# Patient Record
Sex: Female | Born: 1945 | Race: White | Smoking: Current every day smoker
Health system: Northeastern US, Community
[De-identification: ages and names within clinical notes are randomized; demographics above are authoritative.]

## PROBLEM LIST (undated history)

## (undated) DIAGNOSIS — R1032 Left lower quadrant pain: Secondary | ICD-10-CM

## (undated) DIAGNOSIS — M549 Dorsalgia, unspecified: Secondary | ICD-10-CM

## (undated) DIAGNOSIS — H524 Presbyopia: Secondary | ICD-10-CM

## (undated) DIAGNOSIS — H0102B Squamous blepharitis left eye, upper and lower eyelids: Secondary | ICD-10-CM

## (undated) DIAGNOSIS — H0102A Squamous blepharitis right eye, upper and lower eyelids: Secondary | ICD-10-CM

## (undated) DIAGNOSIS — H04129 Dry eye syndrome of unspecified lacrimal gland: Secondary | ICD-10-CM

## (undated) DIAGNOSIS — H52203 Unspecified astigmatism, bilateral: Secondary | ICD-10-CM

## (undated) HISTORY — DX: Squamous blepharitis right eye, upper and lower eyelids: H01.02A

## (undated) HISTORY — DX: Presbyopia: H52.4

## (undated) HISTORY — DX: Unspecified astigmatism, bilateral: H52.203

## (undated) HISTORY — PX: BELPHAROPTOSIS REPAIR: SHX369

## (undated) HISTORY — DX: Left lower quadrant pain: R10.32

## (undated) HISTORY — PX: ABDOMINAL SURGERY: SHX537

## (undated) HISTORY — PX: TUBAL LIGATION: SHX77

## (undated) HISTORY — DX: Dry eye syndrome of unspecified lacrimal gland: H04.129

## (undated) HISTORY — DX: Squamous blepharitis left eye, upper and lower eyelids: H01.02B

## (undated) HISTORY — DX: Dorsalgia, unspecified: M54.9

---

## 2015-01-05 ENCOUNTER — Ambulatory Visit (HOSPITAL_BASED_OUTPATIENT_CLINIC_OR_DEPARTMENT_OTHER): Payer: Medicare (Managed Care) | Admitting: Family Medicine

## 2015-01-05 ENCOUNTER — Encounter (HOSPITAL_BASED_OUTPATIENT_CLINIC_OR_DEPARTMENT_OTHER): Payer: Self-pay | Admitting: Family Medicine

## 2015-01-05 VITALS — BP 132/80 | HR 89 | Temp 96.8°F | Ht 61.42 in | Wt 139.4 lb

## 2015-01-05 DIAGNOSIS — Z0000 Encounter for general adult medical examination without abnormal findings: Secondary | ICD-10-CM

## 2015-01-05 DIAGNOSIS — Z7689 Persons encountering health services in other specified circumstances: Secondary | ICD-10-CM

## 2015-01-05 DIAGNOSIS — F17200 Nicotine dependence, unspecified, uncomplicated: Secondary | ICD-10-CM

## 2015-01-05 DIAGNOSIS — M545 Low back pain: Secondary | ICD-10-CM

## 2015-01-05 MED ORDER — MELOXICAM 15 MG PO TABS
15.0000 mg | ORAL_TABLET | Freq: Every day | ORAL | Status: DC | PRN
Start: 2015-01-05 — End: 2015-07-27

## 2015-01-05 NOTE — Progress Notes (Signed)
Family Medicine Office Note    HPI:  Establish care  Did not connect with previous physician  Last mammogram 1 year ago  Last colonoscopy 2015  Pap smear was 4 years ago   30+ pack year hx - Had yearly CXR but not low dose CT per patient.     Family history:  Mother just passed away  Sister just dx with cancer- uterine, intestine, liver/kidney. Started chemo in [place]  Pt will be traveling there this weekend    Gyn concern:  Hx of growth between uterine and colon  Had monthly exams  Last time was 5 years ago   Had weight loss - per pt it was because she was taking excedrin 8-10 tabs  Regained weight with Ensure  Unsure what dx was but told it was OK    LLQ pain:  Occassional pain   Denies constipation or diarrhea or fever    Cramps in bilateral legs  Taking magnesium and other supplements  Intermittently bothersome    Low back pain;   Occasional radiation of pain down L leg  Controlled on meloxicam 1 tab PO daily PRN  Needs a refill     Social/PMH/FHx:     Past Medical History    Back pain          Past Surgical History    ABDOMINAL SURGERY      TUBAL LIGATION         Family History    Cancer - Other Sister     Comment: Uterus, ovarian, intestinal, liver       Social History   Marital Status: Single  Spouse Name: N/A    Years of Education: N/A  Number of Children: N/A     Occupational History  None on file     Social History Main Topics   Smoking status: Current Every Day Smoker  1.00 Packs/Day  For 53.00 Years     Smokeless tobacco: Not on file    Alcohol Use: Not on file    Drug Use: Not on file    Sexual Activity: Not on file   Not on file  Other Topics Concern   None on file     Social History Narrative       ROS: Pt denied CPE and SOB    EXAM:  BP 132/80 mmHg   Pulse 89   Temp(Src) 96.8 F (36 C) (Temporal)   Ht 5' 1.42" (1.56 m)   Wt 63.231 kg (139 lb 6.4 oz)   BMI 25.98 kg/m2   SpO2 100%  GEN: Well appearing, NAD  CARD: S1 and S2 normal, no murmurs, clicks, gallops or rubs. Regular rate and rhythm.  CHEST: Chest  is clear, no wheezing or rales. Normal symmetric air entry throughout both lung fields.  MOOD: Normal thought content, speech, affect, mood and dress are noted.      ASSESSMENT/PLAN:    Victoria Macdonald was seen today for establish care, other, other and other.    Diagnoses and all orders for this visit:    Right low back pain, with sciatica presence unspecified  Comments:   Chronic and controlled on meloxicam. pt requesting refill.  Orders:  -     meloxicam (MOBIC) 15 MG tablet; Take 1 tablet by mouth daily as needed for Pain.    Tobacco use disorder  Comments:   Previous quit attempts did not work. Declined assistance at this time.     Encounter to establish care with new doctor  Comments:   Medical records requested. Discussed previous gyn concern may have been fibroid or cyst. Will be able to explain more with records.     Healthcare maintenance  Comments:   Brief review of common screening recommendations. Pt will consider low dose CT when she returns from visiting sister.         **AVS given and patient instructions reviewed with patient. The pt verbalizes and demonstrated understanding. Advised to call/RTC with concerns.

## 2015-01-07 DIAGNOSIS — M545 Low back pain, unspecified: Secondary | ICD-10-CM | POA: Insufficient documentation

## 2015-01-07 DIAGNOSIS — F17200 Nicotine dependence, unspecified, uncomplicated: Secondary | ICD-10-CM | POA: Insufficient documentation

## 2015-03-01 ENCOUNTER — Ambulatory Visit (HOSPITAL_BASED_OUTPATIENT_CLINIC_OR_DEPARTMENT_OTHER): Payer: Medicare (Managed Care) | Admitting: Family Medicine

## 2015-03-04 ENCOUNTER — Encounter (HOSPITAL_BASED_OUTPATIENT_CLINIC_OR_DEPARTMENT_OTHER): Payer: Self-pay | Admitting: Family Medicine

## 2015-03-04 ENCOUNTER — Ambulatory Visit (HOSPITAL_BASED_OUTPATIENT_CLINIC_OR_DEPARTMENT_OTHER): Payer: Medicare (Managed Care) | Admitting: Family Medicine

## 2015-03-04 VITALS — BP 100/70 | HR 73 | Temp 98.2°F | Ht 60.63 in | Wt 138.0 lb

## 2015-03-04 DIAGNOSIS — Z8041 Family history of malignant neoplasm of ovary: Secondary | ICD-10-CM

## 2015-03-04 DIAGNOSIS — D269 Other benign neoplasm of uterus, unspecified: Secondary | ICD-10-CM

## 2015-03-04 DIAGNOSIS — Z0000 Encounter for general adult medical examination without abnormal findings: Secondary | ICD-10-CM

## 2015-03-04 DIAGNOSIS — R252 Cramp and spasm: Secondary | ICD-10-CM

## 2015-03-04 DIAGNOSIS — L989 Disorder of the skin and subcutaneous tissue, unspecified: Secondary | ICD-10-CM

## 2015-03-04 LAB — BASIC METABOLIC PANEL
ANION GAP: 7 mmol/L (ref 5–15)
BUN (UREA NITROGEN): 17 mg/dL (ref 7–18)
CALCIUM: 8.2 mg/dL — ABNORMAL LOW (ref 8.5–10.1)
CARBON DIOXIDE: 29 mmol/L (ref 21–32)
CHLORIDE: 106 mmol/L (ref 98–107)
CREATININE: 0.8 mg/dL (ref 0.4–1.2)
ESTIMATED GLOMERULAR FILT RATE: >60 mL/min (ref >60)
Glucose Random: 93 mg/dL (ref 74–160)
POTASSIUM: 4.2 mmol/L (ref 3.5–5.1)
SODIUM: 142 mmol/L (ref 136–145)

## 2015-03-04 LAB — CBC WITH PLATELET
HEMATOCRIT: 40.7 % (ref 34.1–44.9)
HEMOGLOBIN: 13.5 g/dL (ref 11.2–15.7)
MEAN CORP HGB CONC: 33.2 g/dL (ref 31.0–37.0)
MEAN CORPUSCULAR HGB: 28 pg (ref 26.0–34.0)
MEAN CORPUSCULAR VOL: 84.4 fL (ref 80.0–100.0)
MEAN PLATELET VOLUME: 11.7 fL (ref 8.7–12.5)
PLATELET COUNT: 144 10*3/uL — ABNORMAL LOW (ref 150–400)
RBC DISTRIBUTION WIDTH STD DEV: 40.3 fL (ref 35.1–46.3)
RBC DISTRIBUTION WIDTH: 13.2 % (ref 11.5–14.3)
RED BLOOD CELL COUNT: 4.82 M/uL (ref 3.90–5.20)
WHITE BLOOD CELL COUNT: 5.5 10*3/uL (ref 4.0–11.0)

## 2015-03-04 LAB — CHOLESTEROL: Cholesterol: 235 mg/dL (ref 0–239)

## 2015-03-04 LAB — LOW DENSITY LIPOPROTEIN DIRECT: LOW DENSITY LIPOPROTEIN DIRECT: 171 mg/dL (ref 0–189)

## 2015-03-04 LAB — HIGH DENSITY LIPOPROTEIN: HIGH DENSITY LIPOPROTEIN: 45 mg/dL (ref 40–?)

## 2015-03-04 LAB — PHOSPHORUS: PHOSPHORUS: 4.1 mg/dL (ref 2.5–4.9)

## 2015-03-04 LAB — MAGNESIUM: MAGNESIUM: 2.2 mg/dL (ref 1.8–2.4)

## 2015-03-04 NOTE — Progress Notes (Addendum)
Review of Systems   Constitutional: Negative for fever, weight loss and malaise/fatigue.   HENT: Negative for nosebleeds.    Eyes: Positive for blurred vision. Negative for double vision and pain.   Cardiovascular: Negative for chest pain and palpitations.   Gastrointestinal: Negative for vomiting.   Skin: Positive for rash.   Neurological: Negative for weakness.          Presents for complete physical and screening - very concerned because sister was recently diagnosed with ovarian cancer and wants all screens possible.    HPI  Back pain  -Taking the meloxicam for it, has been helping a lot, satisfied with it.    Leg cramping  -Has been having calf and thigh cramps. Calf pain has been in both calves, more left than right, sometimes thigh as well.  -No numbness, no tingling.  -Has been intermittent, can be severe, and can be at any time - sitting, sleeping, walking.  -Was told to take potassium and magnesium for it, which helped, but stopped taking last week because didn't want exams to be skewed.     Sleeping issues  -Having a lot of difficulty sleeping - difficulty falling asleep because mind has been very active   -Would take melatonin but didn't help, tried herbal teas but those didn't help  -Going to try benadryl  -Has not tried anything else  -Neighbor also vacuums early in the morning and wakes her up  -Lately has been getting 4-6 hours sleep/night    Low mood  -Mood has been more down than up  -Sometimes feels depressed  -Low interest in things  -Energy hasn't changed  -Doesn't feel that concentration very impaired  -Appetite hasn't changed - but says she's gained some weight because eating more of the "wrong foods"  -Sometimes feels guilty regarding her sister, but doesn't seem to feel that her guilt has significantly increased  -No thoughts about hurting herself  -Doesn't have anyone she can discuss these feelings with, but was not interested in counseling - feels that she likes to "deal with things  herself"    Smoking  -Decided not to quit smoking right now because it's a stressful time, but wants to quit eventually.    Other  -Slight abdominal pain in inguinal crease, more so on right side.  -Has a small patch of rough skin near right achilles tendon, improves with topical neosporin.  -Used to wear glasses with low prescription, felt her vision corrected and she didn't need them, but has noticed vision getting blurry recently so is planning on getting prescription checked.    Physical exam    Assessment  69 year old woman with a family history of ovarian cancer and a 53-year pack-year smoking history presents for preventative care maintenance. Patient is also in period of high stress and mood has been low, although     Plan  Patient to complete standard blood work and make appointments for bone density scan, mammogram, ovarian ultrasound, and one month follow-up. At follow-up appointment reattempt to perform smoking cessation counseling.

## 2015-03-05 ENCOUNTER — Encounter (HOSPITAL_BASED_OUTPATIENT_CLINIC_OR_DEPARTMENT_OTHER): Payer: Self-pay | Admitting: Family Medicine

## 2015-03-05 ENCOUNTER — Telehealth (HOSPITAL_BASED_OUTPATIENT_CLINIC_OR_DEPARTMENT_OTHER): Payer: Self-pay | Admitting: Family Medicine

## 2015-03-05 DIAGNOSIS — K635 Polyp of colon: Secondary | ICD-10-CM | POA: Insufficient documentation

## 2015-03-05 NOTE — Progress Notes (Signed)
Called pt re: test results & referral.    1. Low Ca - recommend repeat with albumin level, could also check PTH and vit D.  2. Borderline high cholesterol - recommend diet modification.  3. Low Plt - mildly decreased plt; recommend repeat.  4. Referral to Genetics - not available at [facility]; could refer to [facility].  She has too many appts at this time so prefers to delay this until after all of her tests are done.    She has appt on June 9 to review test results and would like to get bloodwork done at that visit.

## 2015-03-05 NOTE — Progress Notes (Signed)
Quick Note:    Patient was notified of results by telephone. See Tel Encounter.  ______

## 2015-03-08 ENCOUNTER — Ambulatory Visit (HOSPITAL_BASED_OUTPATIENT_CLINIC_OR_DEPARTMENT_OTHER): Payer: Medicare (Managed Care)

## 2015-03-08 NOTE — Progress Notes (Signed)
Victoria Macdonald is a 69 year old female here for: Physical    Patient Active Problem List:     Right low back pain     Tobacco use disorder     Sessile colonic polyp    HPI:  -here for CPEX, several concerns today    1. Family hx cancer  -sister recently diagnosed with ovarian cancer  -experiencing low mood, difficulty sleeping, guilt, increased appetite  -no thoughts of hurting herself  -very concerned about cancer screenings for herself - wants blood test for ovarian cancer    2. Leg cramps  -occur mainly at rest  -pain in calves, left worse than right  -no pain with exertion  -tried K and Mg supplements which helped somewhat; stopped prior to this visit    3. Pelvic mass  -old med records not available at this time  -pt reports h/o mass between uterus and rectum - has not had surveillance x 5 yrs  -wondering if she needs a Pap test    4. Smoking  -wants to quit but not ready yet due to recent stressors    MEDICATIONS:   Current Outpatient Prescriptions on File Prior to Visit:  loratadine (CLARITIN) 10 MG tablet Take 10 mg by mouth daily. Disp:  Rfl:    meloxicam (MOBIC) 15 MG tablet Take 1 tablet by mouth daily as needed for Pain. Disp: 90 tablet Rfl: 2     No current facility-administered medications on file prior to visit.   PAST MEDICAL, SURGICAL, FAMILY, SOCIAL, IMMUNIZATION, HEALTH MAINT HISTORY: The following sections have been reviewed and updated in Epic:  past medical history, past surgical history, family history, social history    ROS:  -patch of skin on back of right calf - uses neosporin which helps, scratched it recently and thinks it became red after this    PE:  BP 100/70  Pulse 73  Temp 98.2 F (36.8 C) (Temperature probe)  Ht 5' 0.63" (1.54 m)  Wt 62.6 kg (138 lb)  LMP  (LMP Unknown)  SpO2 98%  BMI 26.39 kg/m2  General: well appearing  HEENT: Normocephalic/atraumatic. EOMI. No scleral icterus or injection. Ears: EAC clear, TM shiny/gray/clear.  Nares: clear.  OP: clear.  Mucous membranes are  moist.   Neck: supple, no lymphadenopathy, no thyromegaly, no thyroid nodules.  Lungs: clear to auscultation bilat, no crackles/wheezes/rhonchi  CV: regular rate and rhythm, S1 & S2 normal, no murmurs/rubs/gallops  ABD: soft, non-tender, no HSM.  Extrem: warm, well perfused, no edema. 1+ DP pulses bilat.  Skin: on back of right calf, just above achilles tendon is a 1cm skin lesion with central telangiectasia, no ulceration, no rolled borders, no scale.    Assessment & Plan:  1. Health maintenance examination  -overall well but very worried about family hx ovarian cancer  - XR Bone Densitometry, Lumbar/Hips; Future  - [facility] Screening Bilateral W CAD; Future    2. Cramp of both lower extremities  -check labs, electrolytes today   - Basic Metabolic Panel   - Magnesium   - Phosphorus   - Cholesterol   - Low Density Lipoprotein, Direct   - High Density Lipoprotein   - CBC with Platelet    3. Benign tumor of uterus  -unclear etiology, still awaiting outside records  -no surveillance x 5 yrs  -ordered US Pelvis Non-Pregnant; Future    4. Family history of ovarian cancer  -discussed various screening methods - advised her that blood testing and  [place] are of limited utility for ovarian cancer screening  -recommended eval by Cancer Genetics to determine most appropriate testing - referral made (Addendum: [facility] no longer available.  Offered [facility] genetics but she declines at this time, prefers to do other testing first)    5. Skin lesion of right leg  -raised skin lesion with telangiectasia - recommend monitoring; recent picking/scratching could contribute to abnormal appearance; recommend avoid touching and reassess @ next visit

## 2015-03-19 ENCOUNTER — Ambulatory Visit (HOSPITAL_BASED_OUTPATIENT_CLINIC_OR_DEPARTMENT_OTHER): Payer: Medicare (Managed Care)

## 2015-03-19 DIAGNOSIS — Z1211 Encounter for screening for malignant neoplasm of colon: Secondary | ICD-10-CM

## 2015-03-19 LAB — POC IMMUNOASSAY FECAL OCCULT BLOOD TEST: POC FECAL OCCULT BLOOD TEST (IMMUNOASSAY): NEGATIVE

## 2015-03-19 NOTE — Progress Notes (Signed)
.  ifob drop off Victoria Macdonald, 03/19/2015, 12:44 PM

## 2015-03-19 NOTE — Progress Notes (Signed)
Dear [place], Please:     1. Select the language ([place]) appropriate Letter Template.       2. Insert the following comments:           Normal     Signed, Fredirick Lathe, MD    "     3. Send the letter to the patient.    Thank you,  Fredirick Lathe, MD

## 2015-03-24 ENCOUNTER — Ambulatory Visit: Payer: Self-pay | Admitting: Family Medicine

## 2015-03-24 DIAGNOSIS — D269 Other benign neoplasm of uterus, unspecified: Secondary | ICD-10-CM

## 2015-03-24 DIAGNOSIS — Z0000 Encounter for general adult medical examination without abnormal findings: Secondary | ICD-10-CM

## 2015-03-24 DIAGNOSIS — Z8041 Family history of malignant neoplasm of ovary: Secondary | ICD-10-CM

## 2015-03-25 LAB — US PELVIC NON-OB W TRANSVAG, 3D

## 2015-03-26 NOTE — Progress Notes (Signed)
PCP will review at her next appt.

## 2015-03-27 LAB — XR DXA BONE DENSITOMETRY

## 2015-04-01 ENCOUNTER — Ambulatory Visit (HOSPITAL_BASED_OUTPATIENT_CLINIC_OR_DEPARTMENT_OTHER): Payer: Medicare (Managed Care) | Admitting: Family Medicine

## 2015-04-01 ENCOUNTER — Encounter (HOSPITAL_BASED_OUTPATIENT_CLINIC_OR_DEPARTMENT_OTHER): Payer: Self-pay | Admitting: Family Medicine

## 2015-04-01 VITALS — BP 124/70 | HR 84 | Temp 97.5°F | Wt 138.2 lb

## 2015-04-01 DIAGNOSIS — M810 Age-related osteoporosis without current pathological fracture: Secondary | ICD-10-CM

## 2015-04-01 DIAGNOSIS — M545 Low back pain: Secondary | ICD-10-CM

## 2015-04-01 DIAGNOSIS — Z7189 Other specified counseling: Secondary | ICD-10-CM

## 2015-04-01 DIAGNOSIS — G479 Sleep disorder, unspecified: Secondary | ICD-10-CM

## 2015-04-01 DIAGNOSIS — R7989 Other specified abnormal findings of blood chemistry: Secondary | ICD-10-CM

## 2015-04-01 DIAGNOSIS — J310 Chronic rhinitis: Secondary | ICD-10-CM

## 2015-04-01 DIAGNOSIS — D696 Thrombocytopenia, unspecified: Secondary | ICD-10-CM

## 2015-04-01 LAB — COMPREHENSIVE METABOLIC PANEL
ALANINE AMINOTRANSFERASE: 20 U/L (ref 12–45)
ALBUMIN: 4.1 g/dL (ref 3.4–5.0)
ALKALINE PHOSPHATASE: 67 U/L (ref 45–117)
ANION GAP: 9 mmol/L (ref 5–15)
ASPARTATE AMINOTRANSFERASE: 17 U/L (ref 8–34)
BILIRUBIN TOTAL: 0.7 mg/dL (ref 0.2–1.0)
BUN (UREA NITROGEN): 13 mg/dL (ref 7–18)
CALCIUM: 8.7 mg/dL (ref 8.5–10.1)
CARBON DIOXIDE: 26 mmol/L (ref 21–32)
CHLORIDE: 107 mmol/L (ref 98–107)
CREATININE: 0.8 mg/dL (ref 0.4–1.2)
ESTIMATED GLOMERULAR FILT RATE: >60 mL/min (ref >60)
Glucose Random: 91 mg/dL (ref 74–160)
POTASSIUM: 4.4 mmol/L (ref 3.5–5.1)
SODIUM: 142 mmol/L (ref 136–145)
TOTAL PROTEIN: 7.3 g/dL (ref 6.4–8.2)

## 2015-04-01 LAB — CBC WITH PLATELET
HEMATOCRIT: 42 % (ref 34.1–44.9)
HEMOGLOBIN: 14 g/dL (ref 11.2–15.7)
MEAN CORP HGB CONC: 33.3 g/dL (ref 31.0–37.0)
MEAN CORPUSCULAR HGB: 29.2 pg (ref 26.0–34.0)
MEAN CORPUSCULAR VOL: 87.7 fL (ref 80.0–100.0)
MEAN PLATELET VOLUME: 12.6 fL — ABNORMAL HIGH (ref 8.7–12.5)
PLATELET COUNT: 157 10*3/uL (ref 150–400)
RBC DISTRIBUTION WIDTH STD DEV: 42.4 fL (ref 35.1–46.3)
RBC DISTRIBUTION WIDTH: 13.2 % (ref 11.5–14.3)
RED BLOOD CELL COUNT: 4.79 M/uL (ref 3.90–5.20)
WHITE BLOOD CELL COUNT: 5.7 10*3/uL (ref 4.0–11.0)

## 2015-04-01 LAB — VITAMIN D,25 HYDROXY: VITAMIN D,25 HYDROXY: 20 ng/mL — ABNORMAL LOW (ref 30.0–100.0)

## 2015-04-01 MED ORDER — TRAZODONE HCL 50 MG PO TABS
25.0000 mg | ORAL_TABLET | Freq: Every evening | ORAL | 1 refills | Status: DC
Start: 2015-04-01 — End: 2016-03-08

## 2015-04-01 MED ORDER — ALENDRONATE SODIUM 70 MG PO TABS
ORAL_TABLET | ORAL | 11 refills | Status: DC
Start: 2015-04-01 — End: 2016-03-08

## 2015-04-01 MED ORDER — FLUTICASONE PROPIONATE 50 MCG/ACT NA SUSP
1.0000 | Freq: Every day | NASAL | 3 refills | Status: DC
Start: 2015-04-01 — End: 2016-05-26

## 2015-04-01 NOTE — Progress Notes (Signed)
Family Medicine Office Note    HPI:    Thrombocytopenia:  Not known before for patient  Interested in repeating test    Cholesterol:  TC 235  LDL 171  HDL 45  Not currently on medication  Increasing salad and oatmeal    Pain if LLQ:  Unable to be visualized on U/S  Denies constipation or diarrhea  Eating oatmeal and aloe  Bimanual per GYN OK per patient     Osteoporosis:  Low Calcium on lab  Per bone density  Pt said she has tried calcium and vitamin D   Does try to eat milk, cheese  Being outside for 89mn a day    Facial pressure with PND:  Has tried loratidine  Cetirizine and allegra did not work   BData processing managera front headache  Makes it hard to sleep  Last tried flonase years ago     Sleep disturbance"  Worse with recent family stressors  Read a lot   Doing puzzles and Facebook for distraction  Nothing for exercise     HM:  Pap- pt requested. Had it done in [place]. Normal per letter to patient    Social/PMH/FHx:   Sister with ovarian cancer- becoming sicker  Tobacco use    ROS:   Endorses low back pain  R knee pain     EXAM:  BP 124/70  Pulse 84  Temp 97.5 F (36.4 C)  Wt 62.7 kg (138 lb 3.2 oz)  LMP  (LMP Unknown)  SpO2 99%  BMI 26.43 kg/m2  GEN: Well appearing, NAD  MOOD: Normal thought content, speech, affect, mood and dress are noted.    ASSESSMENT/PLAN:    Victoria Normingtonis a [age]year old female who presents for    1. Sleep disturbance  Likely due to recent stressors. Pt interested in non-addictive medications  TRied melatonin in past without improvement  - Reviewed increase exercise in AM and other sleep hygiene   - traZODone (DESYREL) 50 MG tablet; Take 0.5-1 tablets by mouth nightly  Dispense: 30 tablet; Refill: 1    We discussed Trazodone and the importance of medication compliance. The patient was ready to learn and no apparent learning barriers were identified. I explained the diagnosis and treatment plan, and the patient expressed understanding of the content. Possible side effects  of the prescribed medication(s) were explained, including sedation/groggy, headache, mood changes.  I attempted to answer any questions regarding the diagnosis and the proposed treatment.      2. Osteoporosis  Per bone density  Pt has not tolerated calcium or vitamin D supplements in the past  - Discussed foods with theses vitamins  - alendronate (FOSAMAX) 70 MG tablet; with full glass of water on empty stomach-nothing else by mouth and do not lie down for 1/2 hour  Dispense: 4 tablet; Refill: 11   We discussed alendronate and the importance of medication compliance. The patient was ready to learn and no apparent learning barriers were identified. I explained the diagnosis and treatment plan, and the patient expressed understanding of the content. Possible side effects of the prescribed medication(s) were explained, including abdominal discomfort.  I attempted to answer any questions regarding the diagnosis and the proposed treatment.    3. Chronic rhinitis  Despite loratadine daily. Will add fluticasone daily  - fluticasone (FLONASE) 50 MCG/ACT nasal spray; 1 spray by Each Nostril route daily  Dispense: 16 g; Refill: 3  - Discussed potential risk of nose bleeds, skin thinning and glaucoma  4. Thrombocytopenia  Will recheck lab  - CBC with Platelet    5. Low serum calcium  Will recheck lab  - Comprehensive Metabolic Panel  - [id number] (Screening)  - ROUTINE VENIPUNCTURE    6. Right low back pain, with sciatica presence unspecified  Deferred due to other concerns    7. Cardiac risk counseling  Lipids overall OK  Cardiac risk high due to smoking  Pt will continue healthy diet  Consider adding statin in future    '[]'$  Next visit- sciatica, knee pain, tobacco use    **AVS given and patient instructions reviewed with patient, who had the opportunity to ask questions. The pt verbalizes and demonstrated understanding. Advised to call/RTC with concerns.

## 2015-04-01 NOTE — Patient Instructions (Signed)
Index Spanish version   Alendronate Sodium, Oral   a-LEN-droh-nate SOH-dee-um  What are other names for this medicine?   Type of medicine: bisphosphonate (reduces bone loss)  Generic and brand names: alendronate sodium, oral; Fosamax  What is this medicine used for?  This medicine is taken by mouth to treat Paget's disease of bone, a disease that destroys or deforms bones.  This medicine is used to prevent or treat osteoporosis (bone loss) in postmenopausal women (those who have stopped having monthly periods) or to treat bone loss in men.  It is also used to treat bone loss in people receiving corticosteroid medicines (for example, prednisone).   What should my healthcare provider know before I take this medicine?   Before taking this medicine, tell your healthcare provider if you have ever had:   an allergic reaction to any medicine    cancer such as leukemia or lymphoma    hypocalcemia (low calcium levels in your blood)    kidney disease    problems or pain when swallowing    ulcers or diseases of the stomach or esophagus.   Tell your healthcare provider if you are not able to sit or stand upright for at least 30 minutes after taking this medicine.   Tell your provider if you have any dental problems, or if you are scheduled for dental surgery. Also tell your healthcare provider the date of your last dental exam. You may be at risk of a disease called osteonecrosis of the jaw if you have any dental problems or dental surgery while taking this medicine.  Females of childbearing age: Tell your healthcare provider if you are pregnant or plan to become pregnant while taking this medicine and after you stop taking this medicine. It is not known whether this medicine will harm an unborn baby. Do not breast-feed while taking this medicine without your healthcare provider's approval.  How do I take it?   Read the Medication Guide that comes in the medicine package when you start taking this medicine and each  time you get a refill.   Check the label on the medicine for directions about your specific dose. Take this medicine exactly as your healthcare provider prescribes. Do not take more or less or take it longer than prescribed.  Take the medicine in the morning, before the first food, beverage, or medicines of the day. Do not take this medicine with, or soon after you take other medicines eat, or drink beverages other than water. It will make this medicine less effective.  Take it with a full glass (8 ounces) of plain water (not mineral water, coffee, tea, or orange juice) while you are standing or sitting upright. If you have the tablet, swallow it whole. Do not break, chew, or let the tablet dissolve in your mouth.  This medicine is available in the form of tablets or as a liquid. If you have the liquid form, drink it all at once. Do not sip and do not mix with any other liquid. Drink at least a quarter of a cup of water (2 ounces) after you take the liquid form of this medicine.  Do not take this medicine at bedtime. Do NOT lie down for at least 30 minutes after taking this medicine. This will keep it from irritating your mouth and throat.  Wait at least 30 minutes after taking this medicine before eating or drinking. ALL foods and drinks (except plain water) can make this medicine less effective. Talk with  your healthcare provider about this.  Take calcium and vitamin D supplements, antacids, or any other medicine at least 30 minutes after this medicine.   Follow your healthcare provider's instructions about exercise, diet, and taking vitamin supplements such as calcium and vitamin D.   What if I miss a dose?  If you miss a once-daily morning dose, do not take it later in the day. Skip the missed dose and take the next scheduled dose as directed. If you miss a once-weekly dose, take 1 tablet the next day. Do not take double doses. If you are not sure of what to do if you miss a dose, or if you miss more than one  dose, contact your healthcare provider.  What if I overdose?  If you or anyone else has intentionally taken too much of this medicine, call 911 or go to the emergency room right away. If you pass out, have seizures, weakness or confusion, or have trouble breathing, call 911. If you think that you or anyone else may have taken too much of this medicine, call the poison control center. Do this even if there are no signs of discomfort or poisoning. The poison control center number is [phone].  Symptoms of an acute overdose may include: nausea, vomiting, heartburn, stomach pain, diarrhea, muscle cramps or stiffness, numbness, tingling, seizures, irritability, unusual thoughts or behavior, coughing blood, black tarry stools.  What should I watch out for?   This medicine may increase your risk for a rare, but severe thigh bone fracture. Contact your healthcare provider right away if you have any new or unusual thigh or hip pain. Talk with your healthcare provider about this.  You may need a bone density test before you start taking this medicine. Your healthcare provider may want to test you again after 6 to 12 months to see how this medicine affects you.   You may need blood tests regularly while you are taking this medicine. Keep all appointments for these tests.   This medicine may increase severe muscle, joint, or bone pain or may cause pain to occur more often. The increased pain may be temporary and may stop during treatment. The pain may occur when you first start taking this medicine or several months later. If the pain continues or gets worse, contact your healthcare provider.  This medicine may cause a rare, but serious disease called osteonecrosis of the jaw. You may be at an increased risk for this problem if you have any dental problems or dental surgery, have cancer, anemia, or blood clotting problems, or you are receiving chemotherapy or corticosteroids (such as prednisone). Talk with your healthcare  provider about this. You should brush your teeth and clean your mouth properly while taking this medicine. If you have any jaw pain, swelling, numbness, or loose teeth, contact your healthcare provider right away.  If you need emergency care, surgery, or dental work, tell the healthcare provider or dentist you are taking this medicine.  What are the possible side effects?   Along with its needed effects, your medicine may cause some unwanted side effects. Some side effects may be very serious. Some side effects may go away as your body adjusts to the medicine. Tell your healthcare provider if you have any side effects that continue or get worse.  Life-threatening (Report these to your healthcare provider right away. If you cannot reach your healthcare provider right away, get emergency medical care or call 911 for help): Allergic reaction (hives; itching; rash; trouble  breathing; tightness in your chest; swelling of your lips, tongue, and throat).  Serious (report these to your healthcare provider right away): Difficulty or pain on swallowing, new or worsening heartburn, severe nausea or vomiting; chest pain or pain under your ribs or in your back; new or unusual pain in your thigh or hip; black or bloody bowel movements; jaw pain, swelling or numbness; severe bone, joint, or muscle pain; skin redness, blistering, or peeling; diarrhea that continues or gets worse; eye pain or redness.  Other: Mild muscle pain, weakness, mild nausea, constipation, diarrhea, gas, headache, upset stomach; runny or stuffy nose, fever, chills.  What products might interact with this medicine?   When you take this medicine with other medicines, it can change the way this or any of the other medicines work. Nonprescription medicines, vitamins, and natural remedies may also interact. Using these products together might cause harmful side effects. Talk to your healthcare provider if you are taking:   antacids that contain aluminum or  magnesium such as Maalox or Mylanta or magnesium supplements (follow instructions about how long to wait after taking this medicine)    aspirin or other salicylates    calcium supplements and vitamin D (follow instructions about how long to wait after taking this medicine)    iron supplements and iron-containing products (follow instructions about how long to wait after taking this medicine)    nonsteroidal anti-inflammatory drugs (NSAIDs) such as diclofenac (Voltaren, Cataflam), ibuprofen (Motrin, Motrin IB, Advil), indomethacin (Indocin), ketoprofen, ketorolac (Toradol), nabumetone (Relafen), naproxen (Naprosyn, Anaprox, Aleve, Naprelan), oxaprozin (Daypro), piroxicam (Feldene), and sulindac (Clinoril)    phosphate supplements   If you take any other medicines, make sure that you know how long to wait before taking them after you have taken this medicine.  If you are not sure if your medicines might interact, ask your pharmacist or healthcare provider. Keep a list of all your medicines with you. List all the prescription medicines, nonprescription medicines, supplements, natural remedies, and vitamins that you take. Be sure that you tell all healthcare providers who treat you about all the products you are taking.   How should I store this medicine?   Store this medicine at room temperature. Keep the container tightly closed. Protect it from heat, high humidity, and bright light.    This advisory includes selected information only and may not include all side effects of this medicine or interactions with other medicines. Ask your healthcare provider or pharmacist for more information or if you have any questions.  Ask your pharmacist for the best way to dispose of outdated medicine or medicine you have not used. Do not throw medicine in the trash.  Keep all medicines out of the reach of children.   Do not share medicines with other people.   Developed by [employer].  Medication Advisor 2012.1 published by  [employer].  Last modified: 2009-12-10  Last reviewed: 2009-08-26  This content is reviewed periodically and is subject to change as new health information becomes available. The information is intended to inform and educate and is not a replacement for medical evaluation, advice, diagnosis or treatment by a healthcare professional.  References   Medication Advisor 2012.1 Index    2012 [employer] and/or its affiliates. All rights reserved.      Index Spanish version   Trazodone, Oral   TRAZ-oh-done  What are other names for this medicine?   Type of medicine: antidepressant  Generic and brand names: trazodone, oral; Oleptro  What is this medicine  used for?  This medicine is taken by mouth to treat depression.  It may be used for other conditions as determined by your healthcare provider.  What should my healthcare provider know before I take this medicine?   Before taking this medicine, tell your healthcare provider if you have ever had:   an allergic reaction to any medicine    heart disease or a heart attack    irregular heartbeat    long QT syndrome (problems with electrical activity in the heart muscle)    liver or kidney disease    mental illness such as bipolar disorder, paranoia, or schizophrenia    problems with too little potassium, magnesium, or sodium in your blood    seizures    thoughts of suicide   Females of childbearing age: Tell your healthcare provider if you are pregnant or plan to become pregnant. It is not known whether this medicine will harm an unborn baby. Do not breast-feed while taking this medicine without your healthcare provider's approval.  How do I take it?   Read the Medication Guide that comes in the medicine package when you start taking this medicine and each time you get a refill.  Check the label on the medicine for directions about your specific dose. Take this medicine exactly as your healthcare provider prescribes. Do not take more of it or take it longer than  prescribed. Do not stop taking this medicine without your healthcare provider's approval. You may have to reduce your dosage gradually. Stopping too quickly may cause withdrawal symptoms.  This medicine may come in different forms. If you have extended-release tablets, do not crush or chew them. Swallow them whole. However, you may break the extended-release tablet in half, but do not crush, or chew them. Swallow them with a full glass of water. Take the extended-release tablets at the same time every day in the late evening, preferably at bedtime, on an empty stomach. Ask your pharmacist if you have the extended-release tablets.   Take the regular tablets (non extended-release) with food.  Take this medicine with food.  What if I miss a dose?  If you miss a dose, take it as soon as you remember unless it is almost time for the next scheduled dose. In that case, skip the missed dose and take the next one as directed. Do not take double doses. If you are not sure of what to do if you miss a dose, or if you miss more than one dose, contact your healthcare provider. Do not stop taking this medicine without your healthcare provider's approval. You may need to reduce your dose slowly to avoid withdrawal symptoms.  What if I overdose?  If you or anyone else has intentionally taken too much of this medicine, call 911 or go to the emergency room right away. If you pass out, have seizures, weakness or confusion, or have trouble breathing, call 911. If you think that you or anyone else may have taken too much of this medicine, call the poison control center. Do this even if there are no signs of discomfort or poisoning. The poison control center number is [phone].  Symptoms of an acute overdose may include: drowsiness, erection that lasts more than 4 hours, vomiting, irregular heartbeat, seizures, slow breathing, breathing that stops.  What should I watch out for?   Antidepressant medicines may increase suicidal thoughts  or actions in some children, teenagers, and young adults within the first few months of treatment. Talk with  your provider about this.  Behavior changes may be caused by the medicine or by depression or another mental illness. Contact your provider right away if you or your family notice any disturbing changes in your thoughts or behavior, such as:   more outgoing or aggressive behavior than normal    confusion    hallucinations    worsening of depression    suicidal thoughts.   It may take several weeks before you start to feel better. Do not stop taking this medicine unless your healthcare provider tells you to do so. You may have withdrawal symptoms if you stop this medicine abruptly.  This medicine increases the effects of alcohol and other drugs that slow down your nervous system. Do not drink alcohol or take other medicines unless your healthcare provider approves.  This medicine may make you dizzy or drowsy or cause blurred vision. Do not drive or operate machinery unless you are fully alert.  You may feel dizzy or faint when you get up quickly after sitting or lying down. Getting up slowly may help.  If you need emergency care, surgery, or dental work, tell the healthcare provider or dentist you are taking this medicine.  Adults over the age of 65 may be at greater risk for side effects. Talk with your healthcare provider about this.  Men: Rarely, this medicine may cause a painful erection of the penis that will not return to normal. If you have an erection lasting more than 4 hours, contact your healthcare provider or get medical care right away. It can lead to permanent erectile dysfunction if not treated.  What are the possible side effects?   Along with its needed effects, your medicine may cause some unwanted side effects. Some side effects may be very serious. Some side effects may go away as your body adjusts to the medicine. Tell your healthcare provider if you have any side effects that  continue or get worse.  Life-threatening (Report these to your healthcare provider right away. If you are unable to reach your healthcare provider right away, get emergency medical care or call 911 for help): Allergic reaction (hives; itching; rash; trouble breathing; tightness in your chest; swelling of your lips, tongue, and throat).  Serious (report these to your healthcare provider right away): Chest pain; chills; fast or irregular heartbeat; hallucinations; high fever; rash; heavy sweating; fainting; loss of bladder control; numbness or tingling in the hands or feet; prolonged erection; seizures; severe confusion; trouble concentrating or memory problems; trouble walking or loss of balance; persistent headache; severe drowsiness; severe muscle stiffness; thoughts of suicide; trouble urinating; twitching or involuntary movement of your body or face; unusual excitement; unusual bruising or bleeding; unusual tiredness or weakness; new or sudden changes in mood, behaviors, thoughts, or feelings; new or worsening trouble sleeping; severe restlessness or panic attacks.  Other: Abnormal dreams, vision problems, constipation, diarrhea, mild dizziness, mild drowsiness, dry mouth, increased or decreased interest in sex, nausea, mild headache, change in sense of taste, muscle aches or pain.  What products might interact with this medicine?   When you take this medicine with other medicines, it can change the way this or any of the other medicines work. Nonprescription medicines, vitamins, natural remedies, and certain foods may also interact. Using these products together might cause harmful side effects. Talk to your healthcare provider if you are taking:   antiarrhythmics (medicine to treat irregular heartbeat) such as amiodarone (Cordarone, Pacerone), disopyramide (Norpace), flecainide (Tambocor), procainamide, propafenone (Rythmol), and quinidine  antibiotics such as clarithromycin (Biaxin), erythromycin  (Erythrocin, EES, E-Mycin, EryPed), ciprofloxacin (Cipro), isoniazid (Laniazid), levofloxacin (Levaquin), linezolid (Zyvox), norfloxacin (Noroxin), ofloxacin (Floxin), metronidazole (Flagyl), pentamidine (NebuPent), rifampin (Rifampin, Rimactane), and rifabutin (Mycobutin)    antidepressants such as amitriptyline, amoxapine, bupropion (Wellbutrin, Zyban), citalopram (Celexa), clomipramine (Anafranil), desipramine (Norpramin), desvenlafaxine (Pristiq), doxepin, duloxetine (Cymbalta), escitalopram (Lexapro), fluoxetine (Prozac), fluvoxamine (Luvox CR), imipramine (Tofranil), paroxetine (Paxil), nefazodone, nortriptyline (Pamelor), protriptyline (Vivactil), sertraline (Zoloft), trimipramine (Surmontil), and venlafaxine (Effexor)    antiseizure medicines such as carbamazepine (Tegretol), felbamate (Felbatol), gabapentin (Neurontin), lamotrigine (Lamictal), levetiracetam (Keppra), oxcarbazepine (Trileptal), phenytoin (Dilantin), primidone (Mysoline), tiagabine (Gabitril), topiramate (Topamax), and valproic acid (Depakote, Depakene)    antifungals such as fluconazole (Diflucan), itraconazole (Sporanox), and ketoconazole (Nizoral)    antihistamines such as chlorpheniramine (Chlor-Trimeton), clemastine (Tavist), diphenhydramine (Benadryl), and hydroxyzine (Vistaril)    antinausea medicines such as prochlorperazine and promethazine (Phenergan)    antipsychotic medicines such as chlorpromazine, clozapine (Clozaril), fluphenazine, haloperidol (Haldol), loxapine (Loxitane), olanzapine (Zyprexa), perphenazine, pimozide (Orap), risperidone (Risperdal), thioridazine, trifluoperazine, and ziprasidone (Geodon)    anti-HIV medicines such as indinavir (Crixivan), nelfinavir (Viracept), ritonavir (Norvir, Kaletra), and saquinavir (Invirase)    aspirin and other salicylates    barbiturates such as butabarbital (Butisol), pentobarbital (Nembutal), and phenobarbital    benzodiazepines such as alprazolam (Xanax), diazepam  (Valium), lorazepam (Ativan), and triazolam (Halcion)    beta blockers such as acebutolol (Sectral), atenolol (Tenormin), carvedilol (Coreg), labetalol (Trandate), metoprolol (Lopressor, Toprol XL), nadolol (Corgard), pindolol, propranolol (Inderal LA), and sotalol (Betapace)    buspirone (BuSpar)    calcium channel blockers such as diltiazem (Cardizem, Tiazac), felodipine, isradipine (DynaCirc CR), nicardipine (Cardene), nifedipine (Adalat CC, Procardia), and verapamil (Calan, Covera-HS, Isoptin SR, Verelan)    cimetidine (Tagamet)    dextromethorphan, an ingredient in many cough and cold medicines such as Robitussin-DM    cyclosporine (Sandimmune, Neoral, Gengraf)    digoxin (Lanoxin)    diuretics (water pills) such as chlorothiazide (Diuril), hydrochlorothiazide (Microzide), and triamterene (Dyrenium)    natural remedies such as gotu kola, kava, St. John's wort, SAMe, and valerian    linezolid (Zyvox)    lithium (Lithobid)    MAO inhibitor antidepressants such as isocarboxazid (Marplan), phenelzine (Nardil), selegiline (Eldepryl, Emsam), and tranylcypromine (Parnate) (Do not take this medicine and an MAO inhibitor within 14 days of each other.)    medicines to treat migraines such as almotriptan (Axert), dihydroergotamine (D.[address]), eletriptan (Relpax), ergotamine (Cafergot, Ergomar), frovatriptan (Frova), naratriptan (Amerge), rizatriptan (Maxalt), sumatriptan succinate (Imitrex), and zolmitriptan (Zomig)    muscle relaxants such as baclofen (Lioresal), carisoprodol (Soma), cyclobenzaprine (Flexeril), dantrolene (Dantrium), methocarbamol (Robaxin), and tizanidine (Zanaflex)    narcotic pain medicines such as codeine, fentanyl (Duragesic, Actiq), hydrocodone/acetaminophen (Vicodin), hydromorphone (Dilaudid), meperidine (Demerol), morphine (Kadian, MS Contin), methadone (Dolophine), oxycodone (OxyContin, Roxicodone), oxycodone/acetaminophen (Percocet, Tylox), and pentazocine (Talwin)     nonsteroidal anti-inflammatory medicines (NSAIDs) such as diclofenac (Voltaren, Cataflam), ibuprofen (Motrin, Motrin IB, Advil), indomethacin (Indocin), ketoprofen, ketorolac (Toradol), nabumetone (Relafen), naproxen (Naprosyn, Anaprox, Aleve, Naprelan), oxaprozin (Daypro), piroxicam (Feldene), and sulindac (Clinoril)    procarbazine (Matulane)    products that contain methylene blue (Prosed DS, Utira-C, Urelle, Utrona-C)    sedatives such as eszopiclone (Lunesta), zaleplon (Sonata), and zolpidem (Ambien)    tramadol (Ultram, Ultram ER)    warfarin (Coumadin)   Do not drink alcohol while you are taking this medicine.  The effects of this medicine may be increased if you take it with grapefruit juice. Talk with your healthcare provider about this.  If you are not sure  if your medicines might interact, ask your pharmacist or healthcare provider. Keep a list of all your medicines with you. List all the prescription medicines, nonprescription medicines, supplements, natural remedies, and vitamins that you take. Be sure that you tell all healthcare providers who treat you about all the products you are taking.   How should I store this medicine?  Store this medicine at room temperature. Keep the container tightly closed. Protect it from heat, high humidity, and bright light.    This advisory includes selected information only and may not include all side effects of this medicine or interactions with other medicines. Ask your healthcare provider or pharmacist for more information or if you have any questions.  Ask your pharmacist for the best way to dispose of outdated medicine or medicine you have not used. Do not throw medicine in the trash.  Keep all medicines out of the reach of children.   Do not share medicines with other people.   Developed by [employer].  Medication Advisor 2012.1 published by [employer].  Last modified: 2010-08-22  Last reviewed: 2008-12-29  This content is reviewed periodically and is  subject to change as new health information becomes available. The information is intended to inform and educate and is not a replacement for medical evaluation, advice, diagnosis or treatment by a healthcare professional.  References   Medication Advisor 2012.1 Index    2012 [employer] and/or its affiliates. All rights reserved.

## 2015-04-09 ENCOUNTER — Encounter (HOSPITAL_BASED_OUTPATIENT_CLINIC_OR_DEPARTMENT_OTHER): Payer: Self-pay | Admitting: Family Medicine

## 2015-04-09 NOTE — Progress Notes (Signed)
Pt informed of results by letter

## 2015-04-20 ENCOUNTER — Telehealth (HOSPITAL_BASED_OUTPATIENT_CLINIC_OR_DEPARTMENT_OTHER): Payer: Self-pay | Admitting: Registered Nurse

## 2015-04-20 NOTE — Telephone Encounter (Signed)
-----   Message from [place] sent at 04/20/2015  4:17 PM EDT -----  Regarding: prescription request   Victoria Macdonald [id number], 69 year old, female    Calls today:   Patient is under a lot of stress is wondering if prescription for  Lorazepam  1 mg  Can be sent to the pharmacy. Please advise     [id number] [place], [place] [address]   Person calling on behalf of patient: Patient (self)    CALL BACK NUMBER: [phone] Best time to call back: any  Cell phone:   Other phone:    Patient's language of care: English    Patient does not need an interpreter.    Patient's PCP: CFredirick Lathe MD

## 2015-04-21 NOTE — Progress Notes (Signed)
TC to this pt ,no answer, L/M that Dr. Marissa Calamity is not going to fill her request for Lorazepam but would like her to call to schedule an appt with her to discuss non-medication ways of reducing her stress.

## 2015-04-21 NOTE — Progress Notes (Signed)
Declined. There are other non-medication and medication options that would be used instead.

## 2015-04-29 ENCOUNTER — Ambulatory Visit (HOSPITAL_BASED_OUTPATIENT_CLINIC_OR_DEPARTMENT_OTHER): Payer: Medicare (Managed Care) | Admitting: Family Medicine

## 2015-05-11 ENCOUNTER — Encounter (HOSPITAL_BASED_OUTPATIENT_CLINIC_OR_DEPARTMENT_OTHER): Payer: Self-pay | Admitting: Family Medicine

## 2015-05-11 ENCOUNTER — Ambulatory Visit (HOSPITAL_BASED_OUTPATIENT_CLINIC_OR_DEPARTMENT_OTHER): Payer: Medicare (Managed Care) | Admitting: Family Medicine

## 2015-05-11 VITALS — BP 120/65 | HR 93 | Temp 97.2°F | Wt 137.0 lb

## 2015-05-11 DIAGNOSIS — R1032 Left lower quadrant pain: Secondary | ICD-10-CM

## 2015-05-11 DIAGNOSIS — G8929 Other chronic pain: Secondary | ICD-10-CM

## 2015-05-11 DIAGNOSIS — F4321 Adjustment disorder with depressed mood: Secondary | ICD-10-CM

## 2015-05-11 DIAGNOSIS — M545 Low back pain: Secondary | ICD-10-CM

## 2015-05-11 NOTE — Progress Notes (Signed)
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  Pt travelled there and was sleeping on the couch but did not sleep much  Endorses guilt from leaving sister but not sure what she can do  Did not feel able to care for self there  Appetite down  Hard to focus  Tearful    Social/PMH/FHx:   Daily tobacco use  Sister with ovarian cancer    ROS:   Cramping- LLQ- was not visualized on last pelvic U/S     EXAM:  BP 120/65  Pulse 93  Temp 97.2 F (36.2 C) (Temporal)  Wt 62.1 kg (137 lb)  LMP  (LMP Unknown)  SpO2 97%  BMI 26.2 kg/m2  GEN: Well appearing, NAD  Cervical, thoracic and lumbar spine exam is normal without tenderness, masses or kyphoscoliosis. Full range of motion without pain is noted. Negative straight leg test. R foot with Babinski reflex present. L knee patellar reflex > R. Sensation grossly intact  MENTAL STATUS EXAM:  Appearance: casually dressed and groomed, good eye contact  Behavior: friendly and cooperative  Alertness: Able to concentrate throughout intake  Speech: WNL  Mood: "don't feel right"  Affect: tearful  Thought Process: logical, linear, goal-oriented  Thought Content: No evidence of delusions  Perceptions: No evidence of hallucinations  Judgment/Impulse Control: good   Insight: good  Cognition: ox3  Suicidal/Homicidal: denies      ASSESSMENT/PLAN:    Victoria Macdonald is a 69 year old female who presents for    1. Adjustment disorder with depressed mood  Grief over sister's dx and health concerns  - Discussed options of meds/therapy. Pt will start with trazodone nightly to help with sleep and  see if improved sleep helps mood  - Also interested in therapy but lives far away so will call insurance/look for local therapist  - Considered SSRI- pt will call if trazodone not improving sx. Can start over the phone after further discussion     2. Chronic right-sided low back pain, with sciatica presence unspecified  Re-evaluated. No current neuro comprise on exam. Unclear that imaging would be helpful.   Pt to continue meloxicam PRN  - Continue regular movement, heat/ice PRN    3. LLQ pain  Unclear cause. Given sister's dx of ovarian cancer pt concerned that cyst/lesion may be on ovary since not visualized on last imaging  Will recheck U/S   - US Pelvis Non-Pregnant; Future      **AVS given and patient instructions reviewed with patient, who had the opportunity to ask questions. The pt verbalizes and demonstrated understanding. Advised to call/RTC with concerns.

## 2015-05-13 DIAGNOSIS — R1032 Left lower quadrant pain: Secondary | ICD-10-CM

## 2015-05-13 DIAGNOSIS — F4321 Adjustment disorder with depressed mood: Secondary | ICD-10-CM | POA: Insufficient documentation

## 2015-05-13 HISTORY — DX: Left lower quadrant pain: R10.32

## 2015-05-21 ENCOUNTER — Ambulatory Visit (HOSPITAL_BASED_OUTPATIENT_CLINIC_OR_DEPARTMENT_OTHER): Payer: Self-pay | Admitting: Family Medicine

## 2015-05-21 DIAGNOSIS — R1032 Left lower quadrant pain: Secondary | ICD-10-CM

## 2015-05-21 LAB — US PELVIC NON-OB W TRANSVAG, 3D, DUPLEX

## 2015-05-21 NOTE — Addendum Note (Signed)
Addended by: Remi Haggard on: 05/21/2015 10:32 AM     Modules accepted: Orders

## 2015-05-25 ENCOUNTER — Telehealth (HOSPITAL_BASED_OUTPATIENT_CLINIC_OR_DEPARTMENT_OTHER): Payer: Self-pay | Admitting: Family Medicine

## 2015-05-25 ENCOUNTER — Encounter (HOSPITAL_BASED_OUTPATIENT_CLINIC_OR_DEPARTMENT_OTHER): Payer: Self-pay | Admitting: Family Medicine

## 2015-05-25 DIAGNOSIS — N800 Endometriosis of uterus: Secondary | ICD-10-CM

## 2015-05-25 DIAGNOSIS — R1032 Left lower quadrant pain: Secondary | ICD-10-CM

## 2015-05-25 DIAGNOSIS — N8003 Adenomyosis of the uterus: Secondary | ICD-10-CM

## 2015-05-25 NOTE — Progress Notes (Signed)
Pt informed by phone. See telephone encounter

## 2015-05-25 NOTE — Progress Notes (Signed)
Called and spoke to patient and gave her message from MD. She would like the Referral to GYN

## 2015-05-25 NOTE — Telephone Encounter (Signed)
-----   Message from Bronwen Betters sent at 05/25/2015 12:01 PM EDT -----  Regarding: returning missed call from PCP re: u/s results of 7/29  Victoria Macdonald [id number], 69 year old, female    Calls today:  Clinical Questions ([place])      Specific nature of request returning missed call from PCP re: u/s results of 7/29, please advise   Return phone number [phone]  Person calling on behalf of patient: Patient (self)      Patient's language of care: English    Patient does not need an interpreter.    Patient's PCP: Fredirick Lathe, MD

## 2015-05-25 NOTE — Progress Notes (Signed)
Left message on home phone to call back.    OK for RN to relay  Pt U/S consistent with adenomyosis- endometrial lining/cysts within the uterine wall  L ovary appeared normal  May be cause of pelvic pain  Do not have to treat but complete treatment only with hysterectomy.    If she would like to consider hysterectomy, can refer to GYN

## 2015-05-28 ENCOUNTER — Encounter (HOSPITAL_BASED_OUTPATIENT_CLINIC_OR_DEPARTMENT_OTHER): Payer: Self-pay | Admitting: Family Medicine

## 2015-06-24 ENCOUNTER — Telehealth (HOSPITAL_BASED_OUTPATIENT_CLINIC_OR_DEPARTMENT_OTHER): Payer: Self-pay | Admitting: Registered Nurse

## 2015-06-24 ENCOUNTER — Encounter (HOSPITAL_BASED_OUTPATIENT_CLINIC_OR_DEPARTMENT_OTHER): Payer: Self-pay | Admitting: Obstetrics & Gynecology

## 2015-06-24 ENCOUNTER — Ambulatory Visit (HOSPITAL_BASED_OUTPATIENT_CLINIC_OR_DEPARTMENT_OTHER): Payer: Medicare (Managed Care) | Admitting: Obstetrics & Gynecology

## 2015-06-24 VITALS — BP 114/80 | Wt 139.0 lb

## 2015-06-24 DIAGNOSIS — R1032 Left lower quadrant pain: Secondary | ICD-10-CM

## 2015-06-24 DIAGNOSIS — M545 Low back pain, unspecified: Secondary | ICD-10-CM

## 2015-06-24 DIAGNOSIS — G8929 Other chronic pain: Secondary | ICD-10-CM

## 2015-06-24 DIAGNOSIS — M810 Age-related osteoporosis without current pathological fracture: Secondary | ICD-10-CM

## 2015-06-24 NOTE — Telephone Encounter (Signed)
-----   Message from [facility] sent at 06/24/2015  2:43 PM EDT -----  Contact: [place] [phone], 69 year old, female    Calls today:  Clinical Questions ([place])      Specific nature of request  Had appointment today with Dr. Marcos Eke who told the patient to call and speak to a nurse regarding scheduling another appointment for two more exams     Return phone number [phone]  Person calling on behalf of patient: Patient (self)        Patient's language of care: English    Patient does not need an interpreter.    Patient's PCP: Fredirick Lathe, MD

## 2015-06-24 NOTE — Progress Notes (Addendum)
OB/GYN Clinic Note  Chief Complaint: Follow up pelvic pain and ultrasound findings  HPI  Victoria Macdonald is a 69 year old [phone] (3 vaginal deliveries, one TAB) with a history of large abdominal surgery, tubal ligation, and early menopause who presents to clinic to follow up on left lower quadrant pain and an ultrasound showing adenomysosis, in the context of anxiety from her sister's new diagnosis of stage IV cancer in the ovary, uterus, intestines, and liver.     The patient has been experiencing two types of left lower quadrant abdominal pain for the past approximately 8 months. First, a more constant pain in the left mid-to-lower abdomen of milder severity, exacerbated when her "intestines are full" and alleviated with defecation. She also feels rarer instances of more severe sharp pain "like a knife in [her] intestine" in the lateral part of her left lower quadrant, alleviated by application of an ice pack on her left lower back.   She tries not to take painkillers. She was not concerned about these symptoms until her sister was recently diagnosed with stage IV cancer affecting the ovaries, uterus, liver, and intestines, and thus she presented to her PCP for evaluation. She underwent a pelvic ultrasound on 05/21/2015, which was read to show adenomyosis, and showed a small uterus and ovaries with no masses.   Her last colonoscopy was last October in [place], which the patient describes was normal except for some hemorrhoids.     Pertinent positives and negatives on OB/GYN review of systems include:   Abnormal bleeding: None  Constipation: None   Urinary incontinence: None  Dyschezia: None    On other ROS, found that she experiences left hip pain, worse when sitting for long periods of time, with pain and tingling shooting down the left lower extremity to the medial aspect of her leg and to her toes.   Also has depressive symptoms.     GYN Hx  Menstruation  Had abnormal bleeding many years  ago when she was younger but does not recall details.   Early menopause at age 77 with no hot flashes, took no medications.     STI  Did not inquire about history of irregular pap smears.   Sexual activity: Not active for 2 decades.     Breast  Last Mammogram: Recently was normal.     OB Hx  OB History   Gravida Para Term Preterm AB SAB TAB Ectopic Multiple Living   '4 3 3  1  1   3      '$ # Outcome Date GA Lbr Len/2nd Weight Sex Delivery Anes PTL Lv   4 TAB            3 Term            2 Term            1 Term                 PMH:     Past Medical History    Back pain        Past Surgical History    ABDOMINAL SURGERY  ~1985    TUBAL LIGATION  ~1975     Allergies:   Review of Patient's Allergies indicates:   Lactose intolerance*    Other (See Comments)    Comment:bloating  Medications:    Current Outpatient Prescriptions:     fluticasone (FLONASE) 50 MCG/ACT nasal spray, 1 spray by Each Nostril route daily, Disp: 16 g,  Rfl: 3    traZODone (DESYREL) 50 MG tablet, Take 0.5-1 tablets by mouth nightly, Disp: 30 tablet, Rfl: 1    alendronate (FOSAMAX) 70 MG tablet, with full glass of water on empty stomach-nothing else by mouth and do not lie down for 1/2 hour, Disp: 4 tablet, Rfl: 11    loratadine (CLARITIN) 10 MG tablet, Take 10 mg by mouth daily., Disp: , Rfl:     meloxicam (MOBIC) 15 MG tablet, Take 1 tablet by mouth daily as needed for Pain., Disp: 90 tablet, Rfl: 2  Social History:  Lived most of her life in [place], [place]. Moved to [place], [place] 3 years ago. Lives alone.   Family History:  Maternal: Sister has stage IV endometrial cancer (uterus, ovary, liver)  Paternal:   Vital Signs:   Extended Vitals not filed for this encounter.  Physical Exam:   General Appearance: Well appearing and comfortable, appears stated age, sitting in clinic, not in acute distress.     Cardiovascular  Normal S1 and S2. No murmurs, rubs, or gallops.     Pulmonary  Both lung fields clear to auscultation.     Abdomen:  Vertical midline  scar measuring about 15 cm in length. Supraumbilical dimpling, indicative of post-surgical tightening of skin. On palpation, abdomen is soft and non-distended without guarding or rigidity. Mild tenderness to palpation on left side of abdomen at level of umbilicus.     GYN:  External genitalia: B/U/S; Normal escutcheon. Nontender. No masses. Two hyperpigmented nodules on either side of labia majora, indicative of sebaceous cysts.  Please refer to Dr. Raeanne Gathers note for the remainder of the GYN exam.     Extremities  No edema in lower extremities. Warm and well perfused.     Skin  No rashes or jaundice.     Ultrasound findings from 05/21/2015:    FINDINGS: The uterus measures 5.2 x 2.3 x 4.3 cm in size. It has a    heterogeneous echotexture with multiple myometrial cysts. Endometrial    thickness is 3 mm.      The left ovary measures 2.4 cm x 1.6 cm x 1.6 cm with normal    echotexture and blood flow.      Difficult visualization of the right ovary which was within normal    limits on the prior study.      There is no free fluid or evidence of obstructive uropathy.      CONCLUSION:    1. 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      2. The left ovary is visualized and shows no abnormality.      Assessment and Plan:  Victoria Macdonald is a 69 year old [phone] (3 vaginal deliveries, one TAB) with a history of large abdominal surgery, tubal ligation, and early menopause who presents to clinic to follow up on left lower quadrant pain and an ultrasound showing adenomysosis, in the context of anxiety from her sister's new diagnosis of stage IV cancer in the ovary, uterus, intestines, and liver.   She does describe two different kinds of pain and we must parse them out.   First, the milder, constant left lower quadrant pain has a broad differential diagnosis including diverticulosis, adhesions (possible given prior surgeries), IBS  (possible given relief with defecation), ischemic bowel disease (unlikely given more chronic nature and recently normal CBC), ovarian cancer (unlikely given normal findings on ultrasound), and colorectal cancer (unlikely  given recent normal colonoscopy), among others.   Second, the sharper pain seems more indicative of a neuromuscular etiology given its quality and alleviation with an ice pack. Given her history of left back pain with associated pain/tingling shooting down her leg, indicative of sciatica, the most likely diagnosis is referred pain from lumbosacral radiculopathy or lumbar stenosis causing sciatica.   Overall, the pain is very unlikely to be from a gynecologic etiology given the small uterus and ovaries, and negative findings of masses (other than possible old adenomyosis, which would not cause significant pelvic pain in this age group). Therefore, the work-up should continue including an abdominal CT scan.      Discussed [place] results with patient and reassured her that most likely not a GYN etiology for the pain.    Put in a records request form from [facility] in [place], [place].    Continue work-up with primary care physician.     This is a preliminary plan, to be reviewed by attending physician, Michae Kava.     Charlyne Mom, MSIII  Pager: [phone]

## 2015-06-24 NOTE — Progress Notes (Signed)
Called and spoke with pt who states that Dr Marcos Eke instructed her to call PCP to put in 3 orders for CT scan and 2 other exams for pt ASAP. No mention of what tests need to be put in for referral in Dr Marcos Eke note. Forwarded to provider and Dr Marcos Eke to please coordinate. Pt is extremely anxious.

## 2015-06-25 LAB — URINE CULTURE

## 2015-06-26 DIAGNOSIS — M810 Age-related osteoporosis without current pathological fracture: Secondary | ICD-10-CM | POA: Insufficient documentation

## 2015-06-26 NOTE — Progress Notes (Signed)
69 yo referred from PCP, Dr. Marissa Calamity for evaluation LLQ/ pelvic pain and ?of adenomyosis on pelvic ultrasound.    Patient reports approximately 8 months of left-sided abdominal pain. On closer questioning,she states she experiences two types of abdominal pain, one of which is mid to lower quadrant, and alleviated by defecation. The other pain is in the LLQ and is intermittent and sharp. Does not take pain medication. Some improvement with ice pack.    Of note her sister was recently diagnosed with Stage 4 ovarian cancer, and patient is quite distressed by that       Pelvic ultrasound 05/21/15 revealed:   small postmenopausal uterus 5.2 x2.3 x4.3cm with myometrial cysts, endometrial thickness 53m. Small postmenopausal ovaries.      She denies postmenopausal bleeding. Denies dysuria. Denies constipation. Not sexually active for >20 years.    Ob/GynHx  S/p NSVD X3  S/p TAb X1  All normal paps.  Premature menopause age 89 S/p tubal ligation    Of note has a h/o abdominal surgery in her 360s- not clear of diagnosis, reports she was admitted and had an NGT for >week, and then a laparotomy through a vertical midline incision.     Patient Active Problem List:     Right low back pain     Tobacco use disorder     Sessile colonic polyp     Adjustment disorder with depressed mood     LLQ pain          Past Medical History    Back pain          Past Surgical History    ABDOMINAL SURGERY  ~1985    TUBAL LIGATION  ~1975       Meds:  Flonase, Trazadone, fosamax, claritin, meloxicam    ROS  Reports left hip pain, radiates down leg  Denies constipation, bloating  No bleeding  No dysuria/hematuria      Exam  Appears well  BP 114/80  Wt 63 kg (139 lb)  LMP  (LMP Unknown)  BMI 26.59 kg/m2  Cor RRR  Lungs clear bilaterally  No CVAT  Abdomen   Soft, vertical midline scar, abdomen soft, nondistended.  Minimal tenderness to left of umbilicus, no rebound  Pelvic  External genitalia clear  Cx/vag no lesions or discharge  Bimanual  small mobile nontender uterus. No adnexal masses or tendernesss  Extr nontender no edema    A/P 69yo postmenopausal woman with left sided abdominal pain. Doubt gyn origin given benign pelvic exam, small postmenopausal uterus (myometrial cysts not clinically significant in postmenopause) and absence of any pelvic mass (normal ovaries on exam and on imaging).     Suspect left sided pain that is relieved by defecation is GI in origin. Has h/o abdominal surgery, may have abdominal adhesions. Would recommend abdominal CT scan for further evaluation.     Patient also complaining of sharper left sided pain that radiates, and is relieved with pressure and ice. Suspect musculoskeletal in nature. Consider evaluation for lumbar pathology/referral ortho and/or PT. Given osteoporosis may need to rule out lumbar compression fractures.    KAlinda Deem

## 2015-06-29 NOTE — Progress Notes (Signed)
Left message for patient to return call tomorrow to  speak with her PCP

## 2015-06-29 NOTE — Progress Notes (Signed)
Please let pt know that PCP will be back in the office tomorrow and can discuss further with her as she knows her history. Pt will likely need an appt to further evaluate.

## 2015-06-30 NOTE — Progress Notes (Signed)
Reviewed note from Dr. Marcos Eke  Will order CT abdomen and pelvis with IV contrast to start insurance process for approval. Normal renal function on June labs.     Will discuss next steps for her back with patient. Would consider Xray and/or PT    Left message for patient to call back.

## 2015-06-30 NOTE — Telephone Encounter (Signed)
-----   Message from Ander Purpura sent at 06/30/2015 10:53 AM EDT -----  Regarding: Calling back  Contact: [place] [phone], 69 year old, female    Calls today:  Clinical Questions ([place])    Name of person calling Victoria Macdonald  Specific nature of request Patient is requesting a call back.  She was left a message to call the provider back.  Return phone number [phone]  Person calling on behalf of patient: Patient (self)    CALL BACK NUMBER: [phone]  Best time to call back: any time  Cell phone:   Other phone:    Patient's language of care: English    Patient does not need an interpreter.    Patient's PCP: Fredirick Lathe, MD

## 2015-07-01 ENCOUNTER — Telehealth (HOSPITAL_BASED_OUTPATIENT_CLINIC_OR_DEPARTMENT_OTHER): Payer: Self-pay | Admitting: Registered Nurse

## 2015-07-01 NOTE — Telephone Encounter (Signed)
-----   Message from Ander Purpura sent at 07/01/2015 12:29 PM EDT -----  Regarding: Patient needs call back  Contact: [place] [phone], 69 year old, female    Calls today:  Clinical Questions ([place])    Name of person calling Patient  Specific nature of request Patient stated she called on Tuesday and she needs a call back ASAP.  Please advise.  Return phone number [phone]  Person calling on behalf of patient: Patient (self)    CALL BACK NUMBER: [phone]  Best time to call back: any time  Cell phone:   Other phone:    Patient's language of care: English    Patient does not need an interpreter.    Patient's PCP: Fredirick Lathe, MD

## 2015-07-01 NOTE — Progress Notes (Signed)
See separate phone encounter

## 2015-07-01 NOTE — Progress Notes (Signed)
Do some exercises that chiropractor showed her that has not been helpful   Pain improves with weight loss but even carries bag with 5lbs then pain comes back right away   Pt does not want therapy right now    Will refer to Sports medicine to evaluate back and hip again and recommend next best steps

## 2015-07-02 ENCOUNTER — Telehealth (HOSPITAL_BASED_OUTPATIENT_CLINIC_OR_DEPARTMENT_OTHER): Payer: Self-pay | Admitting: Family Medicine

## 2015-07-02 NOTE — Progress Notes (Signed)
I called and left a VM. Pt is booked for a CT on Sept 19 @ 12:20 pm in [place], there is no eating or drinking 2 hrs before the test. T# [phone]

## 2015-07-12 ENCOUNTER — Ambulatory Visit (HOSPITAL_BASED_OUTPATIENT_CLINIC_OR_DEPARTMENT_OTHER): Payer: Self-pay | Admitting: Family Medicine

## 2015-07-12 DIAGNOSIS — R1032 Left lower quadrant pain: Secondary | ICD-10-CM

## 2015-07-12 LAB — CT ABDOMEN & PELVIS W IV CONTRAST

## 2015-07-12 MED ORDER — NORMAL SALINE FLUSH 0.9 % IV SOLN
60.00 mL | Freq: Once | INTRAVENOUS | Status: AC
Start: 2015-07-12 — End: 2015-07-12
  Administered 2015-07-12: 60 mL via INTRAVENOUS

## 2015-07-12 MED ORDER — IOHEXOL 300 MG/ML IJ SOLN
100.00 mL | Freq: Once | INTRAMUSCULAR | Status: AC
Start: 2015-07-12 — End: 2015-07-12
  Administered 2015-07-12: 100 mL via INTRAVENOUS

## 2015-07-12 NOTE — Addendum Note (Signed)
Addended by: Chaya Jan on: 07/12/2015 01:26 PM     Modules accepted: Orders, SmartSet

## 2015-07-12 NOTE — Addendum Note (Signed)
Addended by: Jilda Roche on: 07/12/2015 11:58 AM     Modules accepted: Orders

## 2015-07-16 ENCOUNTER — Telehealth (HOSPITAL_BASED_OUTPATIENT_CLINIC_OR_DEPARTMENT_OTHER): Payer: Self-pay | Admitting: Family Medicine

## 2015-07-16 NOTE — Progress Notes (Signed)
L low back and hip pain and LLQ abdominal pain persist    Negative abd CT    Most likely related to back/hip pain.  Pt to see sports medicine for evaluation and recommendation on best next step.     Pt is in agreement with plan

## 2015-07-16 NOTE — Progress Notes (Signed)
Pt informed by phone. See telephone encounter

## 2015-07-27 ENCOUNTER — Encounter (HOSPITAL_BASED_OUTPATIENT_CLINIC_OR_DEPARTMENT_OTHER): Payer: Self-pay | Admitting: Family Medicine

## 2015-07-27 ENCOUNTER — Ambulatory Visit (HOSPITAL_BASED_OUTPATIENT_CLINIC_OR_DEPARTMENT_OTHER): Payer: Medicare (Managed Care) | Admitting: Family Medicine

## 2015-07-27 VITALS — BP 124/78 | HR 90 | Wt 142.0 lb

## 2015-07-27 DIAGNOSIS — G8929 Other chronic pain: Secondary | ICD-10-CM

## 2015-07-27 DIAGNOSIS — M5442 Lumbago with sciatica, left side: Principal | ICD-10-CM

## 2015-07-27 MED ORDER — RANITIDINE HCL 150 MG PO CAPS
150.0000 mg | ORAL_CAPSULE | Freq: Two times a day (BID) | ORAL | 0 refills | Status: DC
Start: 2015-07-27 — End: 2016-01-24

## 2015-07-27 MED ORDER — MELOXICAM 15 MG PO TABS
15.0000 mg | ORAL_TABLET | Freq: Every day | ORAL | 2 refills | Status: DC | PRN
Start: 2015-07-27 — End: 2016-03-08

## 2015-07-27 NOTE — Progress Notes (Signed)
[facility]  Sports Med Consult     Chief Complaint: sports med consult for hip pain  Requested by: Fredirick Lathe, MD     Subjective:   Victoria Macdonald is a 69 year old female patient who has a history of:  Patient Active Problem List:     Right low back pain     Tobacco use disorder     Sessile colonic polyp     Adjustment disorder with depressed mood     LLQ pain     Osteoporosis    Low back pain and left hip  - shooting down back of leg to pinky toe  - has had for 5 years  - gets a flare up every month  - works as Network engineer  - did a lot of gardening when living in [place]  - taking meloxicam every day as made the pain go away for the past two weeks  - burning pain  - no PT  - no loss of continence, numbness    ROS: no skin changes, no numbness/tingling, positive weakness, positive joint pain    Objective:   BP 124/78  Pulse 90  Wt 64.4 kg (142 lb)  LMP  (LMP Unknown)  SpO2 97%  BMI 27.16 kg/m2  General: alert, appears stated age and cooperative  Pulmonary: breathing without distress  Skin: skin color, texture, turgor are normal, No rashes or lesions  Psych: interactive and Normal Mood   Neuro: Alert and oriented X 3    Musculoskeletal Exam  Back  Inspection: back was properly exposed  normal ambulation  Negative Asymmetry  normal kyphosis/lordosis  Negative Skin changes  Positive Single leg squat  Palpation:    tender to pressure paraspinal musculature  tender to pressure SI joint  ROM:   with minimal pain Full Flexion  with minimal pain Full Extension  Strength: 5/5 strength lower extremity  Special testing:   Negative Straight leg raise  Negative Hoover's test  Negative FABER's  Neuro testing:   intact Lower extremity sensory exam  2/4 DTR lower extremity exam    Assessment & Plan:      (M54.42,  G89.29) Chronic left-sided low back pain with left-sided sciatica  Comment: chronic sciatica, has not done PT, starting to get some reflux symptoms with the mobic, will trial antacid, consider OA hip  in differential as well  Plan: REFERRAL TO PHYSICAL THERAPY ( INT), meloxicam         (MOBIC) 15 MG tablet, ranitidine (ZANTAC) 150         MG capsule    1. Follow up with PCP  2. Referral to PT  3. Start on mobic for pain relief  4. Imaging defer  5. Counseled about imaging, diagnosis, and long term prognosis  6. If not 50% better get radiographs hip  7. Patient voiced understanding of plan and agreed with course of action    I am having Ms. Kenmare maintain her loratadine, meloxicam, fluticasone, traZODone, and alendronate.      Jeani Sow DO    I have spent 25 minutes in face to face time with this patient/patient proxy of which > 50% was in counseling or coordination of care regarding above issues/Dx.      Chart cc'd to PCP and above provider that requested the consultation

## 2015-09-17 ENCOUNTER — Telehealth (HOSPITAL_BASED_OUTPATIENT_CLINIC_OR_DEPARTMENT_OTHER): Payer: Self-pay | Admitting: Family Medicine

## 2015-09-17 NOTE — Progress Notes (Signed)
Grace from [facility] called the Central Refill Department to complete a benefit analysis for the TDAP Vaccine.    The vaccine is covered under the patient’s prescription coverage.    Please choose 90715.2 TDAP (Prior Auth/Pharmacy) and notify Central Refill via cc’d chart once the vaccine has been administered.

## 2015-09-20 ENCOUNTER — Telehealth (HOSPITAL_BASED_OUTPATIENT_CLINIC_OR_DEPARTMENT_OTHER): Payer: Self-pay | Admitting: Family Medicine

## 2015-09-20 NOTE — Telephone Encounter (Signed)
-----   Message from Faith Rogue sent at 09/20/2015  2:31 PM EST -----  Regarding: intensive case manager calling with concerns  Contact: [place] [phone], 69 year old, female    Calls today:  Clinical Questions ([place])    Name of person calling Barbara Cower intensive case manager  Specific nature of request just did PHQ9 with pt and she scored 21.  She is very concerned about pt  Pt is very resistant to treatment    Judson Roch wants to discuss medication for depression with doctor would like to speak to someone asap  Please advise    Return phone number [phone]  Person calling on behalf of patient:     CALL BACK NUMBER:   Best time to call back:   Cell phone:   Other phone:    Patient's language of care: English    Patient     Patient's PCP: Fredirick Lathe, MD

## 2015-09-20 NOTE — Progress Notes (Signed)
Returned call to Judson Roch. She is a case Freight forwarder at [employer] who has been having frequent contact with pt. She states pt has been in denial about having depression and refuses to see a therapist or psych, but she feels she is making progress. Today she did a PHQ9 while on the phone with her and she scored 21. She states pt was finally able to recognize she has depression and wants help from PCP for her to agree to treatment.     She thinks pt needs medication - but asked that benzos are not prescribed. She will continue to make weekly contact with pt.     I advised her that I would update PCP with her concerns and she will f/u with pt. (she has appointment tomorrow in clinic)

## 2015-09-21 ENCOUNTER — Ambulatory Visit (HOSPITAL_BASED_OUTPATIENT_CLINIC_OR_DEPARTMENT_OTHER): Payer: Medicare (Managed Care) | Admitting: Family Medicine

## 2016-01-18 ENCOUNTER — Telehealth (HOSPITAL_BASED_OUTPATIENT_CLINIC_OR_DEPARTMENT_OTHER): Payer: Self-pay | Admitting: Family Medicine

## 2016-01-18 NOTE — Progress Notes (Unsigned)
GRACE from [facility] called the Central Refill Department to complete a benefit analysis for the Prevnar-13 Vaccine.    The vaccine is covered under the patients [place] medical coverage.    Please choose [phone] which is listed in EPIC as Prevnar (private)

## 2016-01-18 NOTE — Progress Notes (Unsigned)
GRACE from [facility] called the Central Refill Department to complete a benefit analysis for the TDAP Vaccine.    The vaccine is covered under the patients [place] medical coverage.    Please choose [phone] (Private)    If you do not have the vaccine in stock, please contact the [facility] Drug Distribution department [phone] to have the vaccine delivered to your clinic

## 2016-01-24 ENCOUNTER — Ambulatory Visit (HOSPITAL_BASED_OUTPATIENT_CLINIC_OR_DEPARTMENT_OTHER): Payer: Medicare (Managed Care) | Admitting: Student in an Organized Health Care Education/Training Program

## 2016-01-24 ENCOUNTER — Encounter (HOSPITAL_BASED_OUTPATIENT_CLINIC_OR_DEPARTMENT_OTHER): Payer: Self-pay | Admitting: Student in an Organized Health Care Education/Training Program

## 2016-01-24 VITALS — BP 120/80 | HR 87 | Temp 97.4°F | Ht 61.02 in | Wt 141.8 lb

## 2016-01-24 DIAGNOSIS — G4700 Insomnia, unspecified: Secondary | ICD-10-CM

## 2016-01-24 DIAGNOSIS — K219 Gastro-esophageal reflux disease without esophagitis: Secondary | ICD-10-CM

## 2016-01-24 MED ORDER — DOXEPIN HCL 10 MG PO CAPS
5.0000 mg | ORAL_CAPSULE | Freq: Every evening | ORAL | 0 refills | Status: DC | PRN
Start: 2016-01-24 — End: 2016-02-09

## 2016-01-24 MED ORDER — RANITIDINE HCL 150 MG PO CAPS
150.0000 mg | ORAL_CAPSULE | Freq: Two times a day (BID) | ORAL | 0 refills | Status: DC
Start: 2016-01-24 — End: 2016-02-09

## 2016-01-24 NOTE — Progress Notes (Signed)
[place]    No interpreter was required during this encounter.     Subjective:   Victoria Macdonald is a 70 year old female smoker who presents with the following concerns:    #) Questions about Ranitidine  - was prescribed Ranitidine 07/2015 by Dr. Tomi Bamberger in setting of using Mobic for low back pain with sciatica  - was taking 150 BID, saw a new PCP who prescribed for 150 daily  - experiences reflux symptoms every day if she does not take the pills  - sometimes has cough at night  - Had stopped all "spicy" and "flavorful" foods and got better but cannot live on bland foods  - would like to return to original dose    #) Sleep issues  - having a hard time sleeping  - saw PCP in [place], was prescribed Lorazepam 0.5 1 tab at bedtime PRN  - Lorazepam wasn't helping and she stopped taking  - has tried: "every tea that helps sleep," melatonin, Rescue, trazodone,   - wants to know what she can do help herself sleep  - usually tries to go to sleep at 11 PM. Lays down and cannot fall asleep. Gets out of bed then goes to read, gets into book and it's 2 AM. Will not get to sleep until 4 AM.  - Mind is racing at night time when tries to sleep  - Normally does not having problems staying asleep if she can get to sleep    Review of systems:  10 systems reviewed. Pertinent positives were reviewed as per the HPI above. All other systems were reviewed and are negative.    Patient Active Problem List:     Right low back pain     Tobacco use disorder     Sessile colonic polyp     Adjustment disorder with depressed mood     LLQ pain     Osteoporosis      MEDS:    Current Outpatient Prescriptions:     meloxicam (MOBIC) 15 MG tablet, Take 1 tablet by mouth daily as needed for Pain, Disp: 90 tablet, Rfl: 2    fluticasone (FLONASE) 50 MCG/ACT nasal spray, 1 spray by Each Nostril route daily, Disp: 16 g, Rfl: 3    traZODone (DESYREL) 50 MG tablet, Take 0.5-1 tablets by mouth nightly, Disp: 30 tablet, Rfl: 1     alendronate (FOSAMAX) 70 MG tablet, with full glass of water on empty stomach-nothing else by mouth and do not lie down for 1/2 hour, Disp: 4 tablet, Rfl: 11    loratadine (CLARITIN) 10 MG tablet, Take 10 mg by mouth daily., Disp: , Rfl:     Review of Patient's Allergies indicates:   Lactose intolerance*    Other (See Comments)    Comment:bloating    Objective:  BP 120/80   Pulse 87   Temp 97.4 F (36.3 C) (Temporal)   Ht 5' 1.02" (1.55 m)   Wt 64.3 kg (141 lb 12.8 oz)   LMP  (LMP Unknown)   SpO2 99%   BMI 26.77 kg/m2  GEN:  Well appearing, NAD  HEENT:  PERRLA. EOMI. Sclera non-injected. Neck supple with no lymphadenopathy.  Oropharynx is non-erythematous and without exudate.  HEART:  RRR, no murmurs, rubs or gallops.  PULM: Lungs clear to auscultation bilaterally, no wheezes, crackles or rhonchi.   ABD: abdomen is soft,non-distended, normoactive bowel sounds, no palpable masses or organomegaly. Mild TTP of LLQ.  EXT: normal, no  cyanosis, clubbing and no edema  NEURO: AAO x3, CN II-XII grossly intact, non-focal otherwise  PSYCH: Alert, mood euthymic, affect normal, speech normal, thought process goal-oriented.    Assessment and Plan:  Victoria Macdonald is a 70 year old female with    1. Gastroesophageal reflux disease, esophagitis presence not specified  Unremarkable abdominal exam. Continues to eat spicy foods despite reflux symptoms. Was doing better with Ranitidine BID. Advised to continue the BID dosing if that was giving her relief.  - ranitidine (ZANTAC) 150 MG capsule; Take 1 capsule by mouth 2 (two) times daily  Dispense: 60 capsule; Refill: 0    2. Insomnia, unspecified type  Having difficulty sleeping. Has tried many OTC and herbal remedies without success. Recently tried Ativan without success as well. Advised against using Ativan and explained the potential dangers behind the drug. Will try Doxepin for sleep as it also has H1-blocking properties, which may help her GERD per above as well.  - doxepin  (SINEQUAN) 10 MG capsule; Take 1 capsule by mouth nightly as needed  Dispense: 30 capsule; Refill: 0  - f/u in 2 weeks for re-evaluation    An after-visit summary was provided and patient instructions, if any, were reviewed and printed.  Victoria Macdonald verbalized/demonstrated understanding and was advised to call or return to clinic with any concerns.    For next time:   [  ] Discuss acupuncture for smoking cessation  [  ] CPEX  [  ] Health maintenance    Discussed with Dr. Seymour Bars, MD, 01/24/2016, 2:28 PM

## 2016-01-24 NOTE — Progress Notes (Signed)
Here for gerd and insomnia. Will resume bid ranitidine and try doexeping 10-25 mg hs .  Preceptor Note  I personally reviewed this case, along with the patient's history and exam findings, with Dr. Sharrie Rothman. I confirm the findings, and agree with the assessment and plan, as documented in the visit note.    Marisa Severin MD

## 2016-01-25 ENCOUNTER — Telehealth (HOSPITAL_BASED_OUTPATIENT_CLINIC_OR_DEPARTMENT_OTHER): Payer: Self-pay | Admitting: Family Medicine

## 2016-01-25 NOTE — Progress Notes (Signed)
PRIOR AUTHORIZATION FOR Doxepin 10 mg    Patient has Armed forces operational officer for prescription coverage.  I.D.#: [id number]    Filled out [employer] form. Scanned form into Epic under Environmental health practitioner (PA for Doxepin 10 mg).    Please print out form to be reviewed, signed, and faxed to insurance company.    Once the form has been signed and faxed, please scan signed form into Environmental health practitioner.

## 2016-01-25 NOTE — Progress Notes (Signed)
Prior authorization request for Doxepin 10 mg    Patient has Armed forces operational officer for prescription coverage.  I.D.# [id number]  Phone number: [phone]

## 2016-01-31 ENCOUNTER — Telehealth (HOSPITAL_BASED_OUTPATIENT_CLINIC_OR_DEPARTMENT_OTHER): Payer: Self-pay | Admitting: Family Medicine

## 2016-01-31 NOTE — Progress Notes (Signed)
Benefit analysis for the Prevnar-13 Vaccine:    The vaccine is covered under the patient’s prescription coverage.    Please choose [phone] Prevnar (Prior Auth/Pharmacy)    Thank you

## 2016-01-31 NOTE — Progress Notes (Signed)
PRIOR AUTHORIZATION FOR Doxepin 10 mg has been approved for 1 year    Start Date: 12/25/15  End Date: 01/24/17    PA #: [id number]  Copay: patient picked up on 01/25/16 with no copay    The patient and their preferred pharmacy are aware of the approval.  Approval notice has been scanned into Environmental health practitioner.       Patient has Armed forces operational officer for prescription coverage.  I.D.# [id number]

## 2016-02-09 ENCOUNTER — Telehealth (HOSPITAL_BASED_OUTPATIENT_CLINIC_OR_DEPARTMENT_OTHER): Payer: Self-pay | Admitting: Family Medicine

## 2016-02-09 ENCOUNTER — Encounter (HOSPITAL_BASED_OUTPATIENT_CLINIC_OR_DEPARTMENT_OTHER): Payer: Self-pay | Admitting: Family Medicine

## 2016-02-09 ENCOUNTER — Ambulatory Visit (HOSPITAL_BASED_OUTPATIENT_CLINIC_OR_DEPARTMENT_OTHER): Payer: Medicare (Managed Care) | Admitting: Family Medicine

## 2016-02-09 VITALS — BP 136/72 | HR 83 | Temp 98.1°F | Wt 140.0 lb

## 2016-02-09 DIAGNOSIS — Z789 Other specified health status: Secondary | ICD-10-CM

## 2016-02-09 DIAGNOSIS — R82998 Other abnormal findings in urine: Secondary | ICD-10-CM

## 2016-02-09 DIAGNOSIS — R490 Dysphonia: Secondary | ICD-10-CM

## 2016-02-09 DIAGNOSIS — K219 Gastro-esophageal reflux disease without esophagitis: Secondary | ICD-10-CM

## 2016-02-09 DIAGNOSIS — M542 Cervicalgia: Secondary | ICD-10-CM

## 2016-02-09 DIAGNOSIS — F17200 Nicotine dependence, unspecified, uncomplicated: Secondary | ICD-10-CM

## 2016-02-09 DIAGNOSIS — G4700 Insomnia, unspecified: Secondary | ICD-10-CM

## 2016-02-09 DIAGNOSIS — F419 Anxiety disorder, unspecified: Secondary | ICD-10-CM

## 2016-02-09 LAB — URINALYSIS
BILIRUBIN, URINE: NEGATIVE
GLUCOSE, URINE: 250 MG/DL — AB
KETONE, URINE: NEGATIVE MG/DL
LEUKOCYTE ESTERASE: NEGATIVE
NITRITE, URINE: NEGATIVE
OCCULT BLOOD, URINE: NEGATIVE
PH URINE: 5.5 (ref 5.0–8.0)
PROTEIN, URINE: NEGATIVE MG/DL
SPECIFIC GRAVITY URINE: 1.027 (ref 1.003–1.035)

## 2016-02-09 LAB — COMPREHENSIVE METABOLIC PANEL
ALANINE AMINOTRANSFERASE: 19 U/L (ref 12–45)
ALBUMIN: 4 g/dL (ref 3.4–5.0)
ALKALINE PHOSPHATASE: 56 U/L (ref 45–117)
ANION GAP: 7 mmol/L (ref 5–15)
ASPARTATE AMINOTRANSFERASE: 14 U/L (ref 8–34)
BILIRUBIN TOTAL: 0.5 mg/dL (ref 0.2–1.0)
BUN (UREA NITROGEN): 11 mg/dL (ref 7–18)
CALCIUM: 8.6 mg/dL (ref 8.5–10.1)
CARBON DIOXIDE: 29 mmol/L (ref 21–32)
CHLORIDE: 108 mmol/L — ABNORMAL HIGH (ref 98–107)
CREATININE: 0.7 mg/dL (ref 0.4–1.2)
ESTIMATED GLOMERULAR FILT RATE: >60 mL/min (ref >60)
Glucose Random: 84 mg/dL (ref 74–160)
POTASSIUM: 3.9 mmol/L (ref 3.5–5.1)
SODIUM: 144 mmol/L (ref 136–145)
TOTAL PROTEIN: 6.9 g/dL (ref 6.4–8.2)

## 2016-02-09 LAB — CBC WITH PLATELET
HEMATOCRIT: 41.2 % (ref 34.1–44.9)
HEMOGLOBIN: 13.6 g/dL (ref 11.2–15.7)
MEAN CORP HGB CONC: 33 g/dL (ref 31.0–37.0)
MEAN CORPUSCULAR HGB: 28.4 pg (ref 26.0–34.0)
MEAN CORPUSCULAR VOL: 86 fL (ref 80.0–100.0)
MEAN PLATELET VOLUME: 11.9 fL (ref 8.7–12.5)
PLATELET COUNT: 166 10*3/uL (ref 150–400)
RBC DISTRIBUTION WIDTH STD DEV: 42.6 fL (ref 35.1–46.3)
RBC DISTRIBUTION WIDTH: 13.6 % (ref 11.5–14.3)
RED BLOOD CELL COUNT: 4.79 M/uL (ref 3.90–5.20)
WHITE BLOOD CELL COUNT: 5.3 10*3/uL (ref 4.0–11.0)

## 2016-02-09 MED ORDER — RANITIDINE HCL 150 MG PO CAPS
150.0000 mg | ORAL_CAPSULE | Freq: Two times a day (BID) | ORAL | 11 refills | Status: DC
Start: 2016-02-09 — End: 2016-03-08

## 2016-02-09 MED ORDER — CITALOPRAM HYDROBROMIDE 20 MG PO TABS
20.0000 mg | ORAL_TABLET | Freq: Every day | ORAL | 2 refills | Status: DC
Start: 2016-02-09 — End: 2016-05-26

## 2016-02-09 MED ORDER — NICOTINE 21 MG/24HR TD PT24
1.0000 | MEDICATED_PATCH | TRANSDERMAL | 1 refills | Status: DC
Start: 2016-02-09 — End: 2016-04-14

## 2016-02-09 NOTE — Progress Notes (Unsigned)
Victoria Macdonald from [facility] called the Central Refill Department to complete a benefit analysis for the TDAp Vaccine.    The vaccine is covered under the patients prescription coverage.    Please choose [phone] (Prior Auth/Pharmacy)

## 2016-02-09 NOTE — Progress Notes (Signed)
Family Medicine Office Note    HPI:    Insomnia:  Unable to fall asleep  Did not start doxepin because afraid of dose and unable to cut '5mg'$   Still having insomnia  Only tried trazodone '25mg'$  last year     Anxiety and depressed mood:  High stress with her sister  Was started citalopram '10mg'$   Felt like it worked. Made her a little calmer  Less sad   Now feeling a little worse  Thinks it is "coming back"  Has not missed any doses    Throat discomfort:  Voice is more hoarse  GERD is better with ranitidine but not this   Pills get stuck  No food or liquid dysphagia     Social/PMH/FHx:   Sister with cancer.  Tobacco use- <0.5- 1ppd. Tried cold laser, nicotine gum. Would like to do acupuncture but insurance will not cover unless prescribed by physician.     Hx of L neck cyst - removed years ago    ROS:   Urine was darker. No pain. Worried about gallbladder   L neck pain and fullness- chronic and intermittent    EXAM:  BP 136/72   Pulse 83   Temp 98.1 F (36.7 C) (Temporal)   Wt 63.5 kg (140 lb)   LMP  (LMP Unknown)   SpO2 98%   BMI 26.43 kg/m2  GEN: Well appearing, NAD  MOUTH: no oral lesion appreciated  NECK: soft, TTP in lower L neck and supraclavicular area. No firm lesions appreciated   MOOD: Normal thought content, speech, affect, mood and dress are noted.    ZOX:WRUEA rhythm, normal rate, L axis deviation, normal intervals including QTc, No ST changes     ASSESSMENT/PLAN:    Victoria Macdonald is a 70 year old female who presents for    1. Gastroesophageal reflux disease, esophagitis presence not specified  Improved on BID dosing  Refilled for the year   - ranitidine (ZANTAC) 150 MG capsule; Take 1 capsule by mouth 2 (two) times daily  Dispense: 60 capsule; Refill: 11    2. Hoarseness of voice  3. Neck pain on left side  May be related to GERD or PND but given hx of smoking, higher risk for throat cancer  Referral to ENT for possible evaluation   - CBC with Platelet  - REFERRAL TO ENT ( INT)    4.  Tobacco use disorder  Evaluation for throat lesion. Pt is interested in quitting  Dual therapy. Will start patch and refer to acupuncture  > 3 min counseling  - REFERRAL TO ENT ( INT)  - REFERRAL TO ACUPUNCTURIST ( INT)  - nicotine (NICODERM CQ) 21 MG/24HR; Place 1 patch onto the skin daily  Dispense: 30 patch; Refill: 1    5. Anxiety disorder, unspecified  Normal QTc  - citalopram (CELEXA) 20 MG tablet; Take 1 tablet by mouth daily  Dispense: 30 tablet; Refill: 2  - EKG    6. Insomnia, unspecified type  Chronic. Likely related to anxiety  Increase citalopram for improved anxiety control  Pt to add trazodone '25mg'$  nightly and can increase to '50mg'$  if need  Reviewed cardiac risk- QTc at baseline in normal range    7. Dark urine  No symptoms of infection. Will check for RBC and bili  - Comprehesive Metabolic Panel, Fasting  - Urine Dipstick (Point of Care)  - ROUTINE VENIPUNCTURE    8. Patient has healthcare proxy  - Health Care Proxy  I have spent 40 minutes in face to face time with this patient/patient proxy of which > 50% was in counseling or coordination of care regarding above issues/Dx.      **AVS given and patient instructions reviewed with patient, who had the opportunity to ask questions. The pt verbalizes and demonstrated understanding. Advised to call/RTC with concerns.

## 2016-02-09 NOTE — Patient Instructions (Signed)
Citalopram '20mg'$  - can take 2 tabs of the '10mg'$  until you run out. New prescription is at '20mg'$     Do not start doxepin    In 1 week, if still having trouble sleeping, start trazadone 1/2 tab for 3 nights. If not sleeping, can take whole tab.

## 2016-02-10 ENCOUNTER — Encounter (HOSPITAL_BASED_OUTPATIENT_CLINIC_OR_DEPARTMENT_OTHER): Payer: Self-pay | Admitting: Family Medicine

## 2016-02-10 LAB — EKG

## 2016-02-11 LAB — PHOSPHORUS RANDOM URINE: PHOSPHORUS RANDOM URINE: 33 mg/dL

## 2016-02-11 NOTE — Addendum Note (Signed)
Addended by: Fredirick Lathe on: 02/11/2016 05:05 PM     Modules accepted: Orders

## 2016-02-11 NOTE — Progress Notes (Signed)
Will inform in letter and discuss at next visit with repeat EKG

## 2016-02-21 ENCOUNTER — Other Ambulatory Visit (HOSPITAL_BASED_OUTPATIENT_CLINIC_OR_DEPARTMENT_OTHER): Payer: Self-pay | Admitting: Student in an Organized Health Care Education/Training Program

## 2016-02-21 DIAGNOSIS — G4700 Insomnia, unspecified: Secondary | ICD-10-CM

## 2016-02-25 ENCOUNTER — Encounter (HOSPITAL_BASED_OUTPATIENT_CLINIC_OR_DEPARTMENT_OTHER): Payer: Self-pay | Admitting: Family Medicine

## 2016-02-25 DIAGNOSIS — F411 Generalized anxiety disorder: Secondary | ICD-10-CM | POA: Insufficient documentation

## 2016-02-25 DIAGNOSIS — K219 Gastro-esophageal reflux disease without esophagitis: Secondary | ICD-10-CM | POA: Insufficient documentation

## 2016-02-25 NOTE — Progress Notes (Signed)
Pt informed by letter

## 2016-02-28 ENCOUNTER — Other Ambulatory Visit (HOSPITAL_BASED_OUTPATIENT_CLINIC_OR_DEPARTMENT_OTHER): Payer: Self-pay | Admitting: Family Medicine

## 2016-02-28 DIAGNOSIS — Z1239 Encounter for other screening for malignant neoplasm of breast: Secondary | ICD-10-CM

## 2016-03-06 ENCOUNTER — Ambulatory Visit (HOSPITAL_BASED_OUTPATIENT_CLINIC_OR_DEPARTMENT_OTHER): Payer: Medicare (Managed Care) | Admitting: Otolaryngology

## 2016-03-06 ENCOUNTER — Ambulatory Visit (HOSPITAL_BASED_OUTPATIENT_CLINIC_OR_DEPARTMENT_OTHER): Payer: Self-pay | Admitting: Family Medicine

## 2016-03-06 ENCOUNTER — Encounter (HOSPITAL_BASED_OUTPATIENT_CLINIC_OR_DEPARTMENT_OTHER): Payer: Self-pay | Admitting: Otolaryngology

## 2016-03-06 VITALS — BP 136/66 | HR 84

## 2016-03-06 DIAGNOSIS — Z1239 Encounter for other screening for malignant neoplasm of breast: Secondary | ICD-10-CM

## 2016-03-06 DIAGNOSIS — J381 Polyp of vocal cord and larynx: Secondary | ICD-10-CM

## 2016-03-06 DIAGNOSIS — K219 Gastro-esophageal reflux disease without esophagitis: Secondary | ICD-10-CM

## 2016-03-06 DIAGNOSIS — J310 Chronic rhinitis: Secondary | ICD-10-CM

## 2016-03-06 DIAGNOSIS — M26623 Arthralgia of bilateral temporomandibular joint: Secondary | ICD-10-CM

## 2016-03-06 NOTE — Progress Notes (Signed)
HPI: 70 year old female Corabelle Spackman presents for assessment of dysphonia.     This started 2 months ago.  It fluctuates is severity from day to day.  She noticed new episodes of heartburn around the same time.  It was happening with most meals.  She did diet and lifestyle changes originally.  She was started on PPI and increased from once a day to twice a day.  She started taking a herbal dandilion that has helped. She takes this once a day.  This has helped with heartburn. It has not helped her throat or ears.    Ears are blocked and sometimes painful.  It can radiate down into her throat on right side sometimes.  She is unsure of hearing loss. She is having more troubles that has been present for years. She has intermittent tinnitus that is same on both sides. She denies otorrhea.      She is on flonase intermittently.  She does it around once a week.  She takes claritin as well. The claritin helps some but not the flonase    She had a large sebaceous cyst removed from left side of neck that was attached to voice box when removed by her surgeon in [place].  This was removed 10-15 years ago.     She has cut down on smoking but not quit. She is down to 5 per day.     Past medical, social and family history reviewed in EPIC.    Review of Systems -   Fever No  Cough No  Easy Bruising No  Blurred Vision No  Palpitations No  Heartburn Yes  Stiff Neck No  Headache No  Skin Lesions of the Face No    EXAM  General: WD WN female with a normal voice.  Face: No lesions.  Facial Strength: Normal and symmetric.  Eyes: PERRLA and EOMI.  Ear R: No lesions. Cerumen: none / minimal. Canal clear. TM clear.   Ear L:  No lesions. Cerumen: none / minimal. Canal clear. TM clear.  Nose:  Septum midline.  Turbinates normal size.  No polyps or masses.  Nasopharynx:  clear, no masses    Oral Cavity/Oropharynx:  Mucous membranes moist.  Tonsils 0+.  Teeth in moderate condition.  Tongue no lesions. + TMJ  tenderness  Hypopharynx: Base of tongue no masses. Pyriform sinuses clear.  Larynx: Epiglottis no edema or lesions. Vocal cords: See FOE.  Neck:  Trachea midline.  No palpable masses.  No supraclavicular masses, prominent fat pad, surrounding neck muscles TTP  Thyroid: Normal to palpation.  Lymph:  No palpable nodes.  Subman glands:  Normal size, non-tender.  Parotids: Normal size, no masses or nodes.    FOE:  Indications:  Hx of dysphonia and tobacco abuse, need to evaluate laryngeal and pharyngeal structures not easily seen on indirect laryngoscopy  Nasal cavity was anesthetized with afrin and lidocaine. Scope was passed in Right nasal passage.  Nasopharynx: NML masses or lesions, ET orifices NML  OP:  No/+ cobblestoning, no erythema, no masses or lesions on pharyngeal walls  BOT :  clear no asymmetry, no masses or lesions  Vallecula: clear, no masses or lesions  Epiglottis:  no erythema, no edema, No masses or lesions  AE Folds/Arytenoids:  no erythema, + Edema, No masses or lesions  FVF: No masses or lesions, No erythema, No Edema  TVF: No masses or lesions, No erythema, ++ Edema  (mild Reinke's edema), mobile B/L  Pyriform  Sinuses: No asymmetry, no pooling of secretions, No masses or lesions  Post Cricoid:  No erythema, + Edema, No masses or lesions        A/P  70 year old female presents for assessment of dysphonia.  This has been fluctuating and present for some time.  She is a smoker and has some reflux symptoms.  Her exam shows some mild Reinke's edema and LPR changes.  She also has some history consistent with chronic rhinitis. 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

## 2016-03-06 NOTE — Addendum Note (Signed)
Addended by: Rowan Blase on: 03/06/2016 02:20 PM     Modules accepted: Orders

## 2016-03-07 ENCOUNTER — Telehealth (HOSPITAL_BASED_OUTPATIENT_CLINIC_OR_DEPARTMENT_OTHER): Payer: Self-pay | Admitting: Family Medicine

## 2016-03-07 NOTE — Progress Notes (Unsigned)
Victoria Macdonald from [facility] called the Central Refill Department to complete a benefit analysis for the TDAP and PCV-13 Vaccine.    The vaccine is covered under the patients prescription coverage.    Please choose [id number] for TDAP and [id number] for PCV-13 (Prior Auth/Pharmacy)

## 2016-03-08 ENCOUNTER — Encounter (HOSPITAL_BASED_OUTPATIENT_CLINIC_OR_DEPARTMENT_OTHER): Payer: Self-pay | Admitting: Family Medicine

## 2016-03-08 ENCOUNTER — Ambulatory Visit (HOSPITAL_BASED_OUTPATIENT_CLINIC_OR_DEPARTMENT_OTHER): Payer: Medicare (Managed Care) | Admitting: Family Medicine

## 2016-03-08 VITALS — BP 124/60 | HR 85 | Temp 97.2°F | Wt 139.6 lb

## 2016-03-08 DIAGNOSIS — M810 Age-related osteoporosis without current pathological fracture: Secondary | ICD-10-CM

## 2016-03-08 DIAGNOSIS — M5442 Lumbago with sciatica, left side: Secondary | ICD-10-CM

## 2016-03-08 DIAGNOSIS — G8929 Other chronic pain: Secondary | ICD-10-CM

## 2016-03-08 DIAGNOSIS — R9431 Abnormal electrocardiogram [ECG] [EKG]: Secondary | ICD-10-CM

## 2016-03-08 DIAGNOSIS — R81 Glycosuria: Secondary | ICD-10-CM

## 2016-03-08 DIAGNOSIS — R221 Localized swelling, mass and lump, neck: Secondary | ICD-10-CM

## 2016-03-08 DIAGNOSIS — G479 Sleep disorder, unspecified: Secondary | ICD-10-CM

## 2016-03-08 LAB — POC URINALYSIS
BILIRUBIN, URINE: NEGATIVE
GLUCOSE,URINE: NEGATIVE
KETONE, URINE: NEGATIVE
NITRITE, URINE: NEGATIVE
OCCULT BLOOD, URINE: NEGATIVE
PH URINE: 6 (ref 5.0–8.0)
PROTEIN, URINE: NEGATIVE
SPECIFIC GRAVITY, URINE: 1.025 (ref 1.003–1.030)
UROBILINOGEN URINE: 0.2 (ref 0.2–1.0)

## 2016-03-08 LAB — URINALYSIS
CASTS: NONE SEEN PER LPF
GLUCOSE, URINE: NEGATIVE MG/DL
KETONE, URINE: NEGATIVE MG/DL
NITRITE, URINE: NEGATIVE
OCCULT BLOOD, URINE: NEGATIVE
PH URINE: 5.5 (ref 5.0–8.0)
PROTEIN, URINE: NEGATIVE MG/DL
RED BLOOD CELLS URINE: NONE SEEN PER HPF (ref 0–2)
SPECIFIC GRAVITY URINE: 1.02 (ref 1.003–1.035)
SQUAMOUS EPITHELIAL CELLS: >10 PER LPF — AB (ref 0–4)

## 2016-03-08 MED ORDER — MELOXICAM 15 MG PO TABS
15.0000 mg | ORAL_TABLET | Freq: Every day | ORAL | 2 refills | Status: DC | PRN
Start: 2016-03-08 — End: 2016-10-19

## 2016-03-08 MED ORDER — TRAZODONE HCL 50 MG PO TABS
25.0000 mg | ORAL_TABLET | Freq: Every evening | ORAL | 1 refills | Status: DC
Start: 2016-03-08 — End: 2016-05-02

## 2016-03-08 MED ORDER — ALENDRONATE SODIUM 70 MG PO TABS
ORAL_TABLET | ORAL | 11 refills | Status: DC
Start: 2016-03-08 — End: 2018-04-30

## 2016-03-08 NOTE — Progress Notes (Signed)
Family Medicine Office Note    HPI:    Insomnia:  Increased citalopram and added trazodone  Citalopram helped with mood  Sleep is still a problem- bed 10:30-11PM  Get up for a snack   Sometimes 3AM going to sleep  Does sleep well   Has to wake up at 9AM     Throat discomfort:  Seen by ENT- Reinke's  Advised smoking cessation and flonase daily- follow up with them in 2-3 months    EKG:  V2 with misplaced lead  Repeat EKG recommended    Reflux:  Stopped ranitidine because not effective   Using dandelion 1 tab daily  Stopped fried foods, spicy foods  Feels that this has been more helpful    HM- had mammogram on 5/15- not read yet    Social/PMH/FHx:   Tobacco use- when to acupuncture and they will work on Insurance underwriter. Will start patch the day she starts acupuncture    ROS:   Urinary smell- no pain. Drinking lots of water  Lost weight- thinks it is citalopram and change of diet for GERD    EXAM:  BP 124/60   Pulse 85   Temp 97.2 F (36.2 C) (Temporal)   Wt 63.3 kg (139 lb 9.6 oz)   LMP  (LMP Unknown)   SpO2 97%   BMI 26.36 kg/m2  GEN: Well appearing, NAD  NECK: The neck is supple and free of adenopathy or masses, the thyroid is normal without enlargement or nodules. Bilateral lower neck/supraclavicular area with fullness- L neck has palpable, mobile mass that is tender to palpation   MOOD: Normal thought content, speech, affect, mood and dress are noted.      EKG- sinus rhythm, normal rate, L axis deviation, normal intervals- QTc at 460, no T wave inversions or ST changes appreciated    ASSESSMENT/PLAN:    Victoria Macdonald is a 70 year old female who presents for  1. Glucose found in urine on examination  Unclear cause. Pt still complaining of urinary smell. Urine dip is amber in color with small LE.   Will send for UA  - Urinalysis    2. Neck fullness  Likely fatty tissue and consider reactive LN in L neck or cyst or lipoma  Will start with [place] for evaluation. Consider CT in future  - US Neck Soft Tissue;  Future    3. Age-related osteoporosis without current pathological fracture  DEXA 1 year ago  Continue bisphosphonate for 5-6 years  - alendronate (FOSAMAX) 70 MG tablet; with full glass of water on empty stomach-nothing else by mouth and do not lie down for 1/2 hour  Dispense: 4 tablet; Refill: 11    4. Chronic left-sided low back pain with left-sided sciatica  Also neck and headache pain. Discuss minimizing NSAID use due to cardiac risk,  - meloxicam (MOBIC) 15 MG tablet; Take 1 tablet by mouth daily as needed for Pain  Dispense: 90 tablet; Refill: 2    5. Sleep disturbance  Chronic. Pt to increase trazodone to 1 tab night. QTc is appropriate currently  - traZODone (DESYREL) 50 MG tablet; Take 0.5-1 tablets by mouth nightly  Dispense: 30 tablet; Refill: 1    6. EKG, abnormal  V2 lead misplaced previously. Appears appropriate today. QTc at upper level of normal. Will continue citalopram and trazodone as currently prescribed  - EKG      **AVS given and patient instructions reviewed with patient, who had the opportunity to ask questions. The  pt verbalizes and demonstrated understanding. Advised to call/RTC with concerns.

## 2016-03-08 NOTE — Progress Notes (Signed)
Informed in visit

## 2016-03-11 LAB — EKG

## 2016-03-13 LAB — MA NON ~~LOC~~ IMAGES

## 2016-03-13 LAB — MA SCREENING MAMMO BILATERAL DIGITAL WITH DBT & CAD

## 2016-03-16 ENCOUNTER — Ambulatory Visit (HOSPITAL_BASED_OUTPATIENT_CLINIC_OR_DEPARTMENT_OTHER): Payer: Self-pay | Admitting: Family Medicine

## 2016-03-16 DIAGNOSIS — R221 Localized swelling, mass and lump, neck: Secondary | ICD-10-CM

## 2016-03-16 NOTE — Progress Notes (Signed)
Noted report. Will address next visit

## 2016-03-16 NOTE — Addendum Note (Signed)
Addended by: Bing Plume on: 03/16/2016 03:48 PM     Modules accepted: Orders

## 2016-03-16 NOTE — Progress Notes (Signed)
Crystals with squamous cells. Will address further next visit

## 2016-03-17 LAB — US NECK SOFT TISSUE

## 2016-03-25 ENCOUNTER — Other Ambulatory Visit (HOSPITAL_BASED_OUTPATIENT_CLINIC_OR_DEPARTMENT_OTHER): Payer: Self-pay | Admitting: Student in an Organized Health Care Education/Training Program

## 2016-03-25 DIAGNOSIS — G4700 Insomnia, unspecified: Secondary | ICD-10-CM

## 2016-03-25 DIAGNOSIS — R9431 Abnormal electrocardiogram [ECG] [EKG]: Secondary | ICD-10-CM

## 2016-03-25 NOTE — Progress Notes (Signed)
PER Pharmacy, Victoria Macdonald is a 70 year old female has requested a refill of doxepin 10                                              - Please review, doxepin has been marked discontinued on 02-09-16.   Last Office Visit: 03-08-16 with PCP  Last Physical Exam: 03-04-15    Other Med Adult:  Most Recent BP Reading(s)  03/08/16 : 124/60          Cholesterol (mg/dL)   Date Value   03/04/2015 235   ----------    LOW DENSITY LIPOPROTEIN DIRECT (mg/dL)   Date Value   03/04/2015 171   ----------    HIGH DENSITY LIPOPROTEIN (mg/dL)   Date Value   03/04/2015 45   ----------  No results found for: TG      No results found for: TSHSC      No results found for: TSH    No results found for: HGBA1C      No results found for: INR      SODIUM (mmol/L)   Date Value   02/09/2016 144   ----------      POTASSIUM (mmol/L)   Date Value   02/09/2016 3.9   ----------          CREATININE (mg/dL)   Date Value   02/09/2016 0.7   ----------    Documented patient preferred pharmacies:    [id number] [place] [place] [address] Phone: [phone]: [phone]

## 2016-03-29 NOTE — Progress Notes (Signed)
[facility] to [facility]. Please review

## 2016-03-30 NOTE — Progress Notes (Signed)
Pt informed by phone. See telephone encounter

## 2016-03-30 NOTE — Progress Notes (Signed)
Headache in R side  Also hurts in R ear- pounding in R ear  Coming and going the last 2 weeks  Woke up with pain this morning at 2:30AM  Ice helped a little  Hurts on the skin/Scalp area   Vision is actually getting better     Saw ENT in may- thought R ear pain was related to TMJ    Feeling fullness in left shoulder neck  Occasional heart racing. No chest pain     Given Possible Anterior infarct (cited on or before 08-Mar-2016) per EKG, will order ECHO.     Advised pt if she has severe headache or associated nausea, vomiting, numbness or tingling, she should go to ER. She endorses understanding    Will make sooner visit next week with me to assess TMJ/neuro.

## 2016-04-04 ENCOUNTER — Encounter (HOSPITAL_BASED_OUTPATIENT_CLINIC_OR_DEPARTMENT_OTHER): Payer: Self-pay | Admitting: Family Medicine

## 2016-04-04 ENCOUNTER — Ambulatory Visit (HOSPITAL_BASED_OUTPATIENT_CLINIC_OR_DEPARTMENT_OTHER): Payer: Medicare (Managed Care) | Admitting: Family Medicine

## 2016-04-04 VITALS — BP 104/62 | HR 86 | Temp 97.4°F | Wt 134.0 lb

## 2016-04-04 DIAGNOSIS — R9431 Abnormal electrocardiogram [ECG] [EKG]: Secondary | ICD-10-CM

## 2016-04-04 DIAGNOSIS — H60391 Other infective otitis externa, right ear: Secondary | ICD-10-CM

## 2016-04-04 MED ORDER — NEOMYCIN-POLYMYXIN-HC 3.5-10000-1 OT SOLN
3.0000 [drp] | Freq: Four times a day (QID) | OTIC | 0 refills | Status: AC
Start: 2016-04-04 — End: 2016-04-09

## 2016-04-04 NOTE — Progress Notes (Signed)
Family Medicine Office Note    HPI:    R ear/head pain:  Otitis externa dx by ER- [facility]  Neomycin, Polymyxin  Tramadol given but pt did not fill  CT scan - negative per patient  Overall feeling close to normal  Occasional pains around ear but much improved    Abnormal EKG  Pt has ECHO set up already  No new sx    EXAM:  BP 104/62   Pulse 86   Temp 97.4 F (36.3 C) (Temporal)   Wt 60.8 kg (134 lb)   LMP  (LMP Unknown)   SpO2 98%   BMI 25.3 kg/m2  GEN: Well appearing, NAD  HEENT: PERLA, EOMI; TM intact without perforation or effusion, external canal normal. R ear with erythema around TM and dried white medication. No significant cerumenosis noted;  Oral cavity, tongue, pharynx and palate have no inflammation or suspicious lesions.  Mild discomfort with palpation of masticator muscle and L TMJ click  MOOD: Normal thought content, speech, affect, mood and dress are noted.    ASSESSMENT/PLAN:    Victoria Macdonald is a 70 year old female who presents for    1. Infective otitis externa, right  Greatly improved headache with topical tx. Still may be component of TMJ. Unlikely vasculitis, mastoiditis given improvement.  She will stop medication after 5 days and monitor sx and complete a further 5d of treatment if needed.  pt will call if sx worsen or do not resolve completely.  - neomycin-polymyxin-hydrocortisone (CORTISPORIN) otic solution; Place 3 drops into the right ear 4 (four) times daily  Dispense: 10 mL; Refill: 0    2. Abnormal EKG  ECHO ordered. Pt will follow up     **AVS given and patient instructions reviewed with patient, who had the opportunity to ask questions. The pt verbalizes and demonstrated understanding. Advised to call/RTC with concerns.

## 2016-04-07 ENCOUNTER — Ambulatory Visit (HOSPITAL_BASED_OUTPATIENT_CLINIC_OR_DEPARTMENT_OTHER): Payer: Self-pay | Admitting: Family Medicine

## 2016-04-07 LAB — ECHOCARDIOGRAM W/ DOPPLER

## 2016-04-11 ENCOUNTER — Other Ambulatory Visit (HOSPITAL_BASED_OUTPATIENT_CLINIC_OR_DEPARTMENT_OTHER): Payer: Self-pay | Admitting: Family Medicine

## 2016-04-11 MED ORDER — RANITIDINE HCL 150 MG PO TABS
150.0000 mg | ORAL_TABLET | Freq: Two times a day (BID) | ORAL | 2 refills | Status: DC
Start: 2016-04-11 — End: 2017-02-08

## 2016-04-11 NOTE — Progress Notes (Signed)
PER Pharmacy, Victoria Macdonald is a 70 year old female has requested a refill of Ranitidine '150mg'$ .     - Please review, Ranitidine '150mg'$  has been discontinued on 03/08/2016.      **Capsules no longer available... Pharmacy does have tablets in stock**         Last Office Visit: 04/04/2016       With: Fredirick Lathe  Last Physical Exam: 03/04/2015      Other Med Adult:  Most Recent BP Reading(s)  04/04/16 : 104/62          Cholesterol (mg/dL)   Date Value   03/04/2015 235   ----------    LOW DENSITY LIPOPROTEIN DIRECT (mg/dL)   Date Value   03/04/2015 171   ----------    HIGH DENSITY LIPOPROTEIN (mg/dL)   Date Value   03/04/2015 45   ----------  No results found for: TG      No results found for: TSHSC      No results found for: TSH    No results found for: HGBA1C      No results found for: INR      SODIUM (mmol/L)   Date Value   02/09/2016 144   ----------      POTASSIUM (mmol/L)   Date Value   02/09/2016 3.9   ----------          CREATININE (mg/dL)   Date Value   02/09/2016 0.7   ----------    Documented patient preferred pharmacies:    [id number] [place] [place] [address] Phone: [phone]: [phone]

## 2016-04-12 ENCOUNTER — Telehealth (HOSPITAL_BASED_OUTPATIENT_CLINIC_OR_DEPARTMENT_OTHER): Payer: Self-pay | Admitting: Family Medicine

## 2016-04-12 NOTE — Telephone Encounter (Signed)
-----   Message from Rodena Piety sent at 04/12/2016 11:28 AM EDT -----  Regarding: requetsing ECHOCARDIOGRAM  results  Contact: [place] [phone], 70 year old, female    Calls today:  Clinical Questions ([place])    Requesting call re ECHOCARDIOGRAM   Return phone number [phone]  Person calling on behalf of patient: Patient (self)      Patient's language of care: English    Patient does not need an interpreter.    Patient's PCP: Fredirick Lathe, MD

## 2016-04-12 NOTE — Progress Notes (Signed)
Informed of essentially normal ECHO  Trace regurg at mitral and tricuspid- not concerning at this time    Thanked me for the call and wished me a happy 4th of July    I will see her for CPEX in July

## 2016-04-12 NOTE — Progress Notes (Signed)
Pt informed by phone. See telephone encounter

## 2016-04-14 ENCOUNTER — Other Ambulatory Visit (HOSPITAL_BASED_OUTPATIENT_CLINIC_OR_DEPARTMENT_OTHER): Payer: Self-pay | Admitting: Family Medicine

## 2016-04-14 DIAGNOSIS — F17200 Nicotine dependence, unspecified, uncomplicated: Secondary | ICD-10-CM

## 2016-04-14 NOTE — Progress Notes (Signed)
PER Pharmacy, Victoria Macdonald is a 70 year old female has requested a refill of Nicoderm CQ 21 mg.      Last Office Visit: 04/04/16 with Dr. Marissa Calamity  Last Physical Exam: 03/04/15      Other Med Adult:  Most Recent BP Reading(s)  04/04/16 : 104/62          Cholesterol (mg/dL)   Date Value   03/04/2015 235   ----------    LOW DENSITY LIPOPROTEIN DIRECT (mg/dL)   Date Value   03/04/2015 171   ----------    HIGH DENSITY LIPOPROTEIN (mg/dL)   Date Value   03/04/2015 45   ----------  No results found for: TG      No results found for: TSHSC      No results found for: TSH    No results found for: HGBA1C      No results found for: INR      SODIUM (mmol/L)   Date Value   02/09/2016 144   ----------      POTASSIUM (mmol/L)   Date Value   02/09/2016 3.9   ----------          CREATININE (mg/dL)   Date Value   02/09/2016 0.7   ----------    Documented patient preferred pharmacies:    [id number] [place] [place] [address] Phone: [phone]: [phone]

## 2016-04-26 ENCOUNTER — Telehealth (HOSPITAL_BASED_OUTPATIENT_CLINIC_OR_DEPARTMENT_OTHER): Payer: Self-pay | Admitting: Family Medicine

## 2016-04-26 NOTE — Progress Notes (Signed)
Victoria Macdonald from [facility] called the [facility]ill Department to complete a benefit analysis for the TDAP & PCV13 Vaccine.    The vaccine is covered under the patients prescription coverage.    Please choose 90715.2 Boostrix (Prior Auth/Pharmacy)    Please choose [phone] PCV13 (Prior Auth/Pharmacy)

## 2016-05-02 ENCOUNTER — Ambulatory Visit (HOSPITAL_BASED_OUTPATIENT_CLINIC_OR_DEPARTMENT_OTHER): Payer: Medicare (Managed Care) | Admitting: Family Medicine

## 2016-05-02 ENCOUNTER — Encounter (HOSPITAL_BASED_OUTPATIENT_CLINIC_OR_DEPARTMENT_OTHER): Payer: Self-pay | Admitting: Family Medicine

## 2016-05-02 VITALS — BP 106/60 | HR 84 | Temp 97.8°F | Ht 61.61 in | Wt 134.0 lb

## 2016-05-02 DIAGNOSIS — G8929 Other chronic pain: Secondary | ICD-10-CM

## 2016-05-02 DIAGNOSIS — J3089 Other allergic rhinitis: Secondary | ICD-10-CM

## 2016-05-02 DIAGNOSIS — Z0000 Encounter for general adult medical examination without abnormal findings: Secondary | ICD-10-CM

## 2016-05-02 DIAGNOSIS — Z7189 Other specified counseling: Secondary | ICD-10-CM

## 2016-05-02 DIAGNOSIS — F17200 Nicotine dependence, unspecified, uncomplicated: Secondary | ICD-10-CM

## 2016-05-02 DIAGNOSIS — M545 Low back pain: Secondary | ICD-10-CM

## 2016-05-02 MED ORDER — FEXOFENADINE HCL 180 MG PO TABS
180.0000 mg | ORAL_TABLET | Freq: Every day | ORAL | 11 refills | Status: DC
Start: 2016-05-02 — End: 2016-05-26

## 2016-05-02 NOTE — Progress Notes (Signed)
Female Annual Wellness Visit  Progress Note     Subjective:   Victoria Macdonald is a 70 year old female who comes for the Initial Annual wellness visit, including Personalized Prevention Plan (PPPE) (it has been at least 12 months after this patient's effective date with Medicare B, and is the first annual wellness visit)..     L rib pain- 3-4 weeks ago. Ribs are better but lower abd more painful    R ear and scalp pain: much better after EOM but still intermittent sharp pain     Neck pain, back pain and leg pain- Acupuncture is helping a lot    Nasal congestion and PND- now having some L upper tooth pain  Flonase- 1spray nightly and loratadine. Did not find that zyrtec work  Taking ranitidine and dandelion root      History: (These sections have been updated in their respective activities)   Review of Patient's Allergies indicates:   Lactose intolerance*    Other (See Comments)    Comment:bloating  Past Medical History:  No date: Back pain  05/13/2015: LLQ pain  Past Surgical History:  ~1985: ABDOMINAL SURGERY  ~1975: TUBAL LIGATION    Family History    Cancer - Other Sister     Comment: Uterus, ovarian, intestinal, liver    Glaucoma Brother     OTHER Sister     Comment: Dementia       Social History   Marital status: Single  Spouse name: N/A    Years of education: N/A  Number of children: N/A     Occupational History  None on file     Social History Main Topics   Smoking status: Current Every Day Smoker  0.25 Packs/day  For 53.00 Years     Smokeless tobacco: Never Used    Alcohol use No    Drug use: No    Sexual activity: Not Currently     Other Topics Concern   None on file     Social History Narrative    2017    Lives alone    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[employer], helps others with translation    Diet- changed because of heartburn- yogurt, potatoes, broccoli    Exercise- walking a little     Wears her seatbelt    Denies safety concerns    Faith is important to her        Current Outpatient Prescriptions:  [facility] NTS STEP 1 21 MG/24HR PLACE 1 PATCH ONTO THE SKIN DAILY Disp: 28 patch Rfl: 0   raNITIdine (ZANTAC) 150 MG tablet Take 1 tablet by mouth 2 (two) times daily Disp: 60 tablet Rfl: 2   alendronate (FOSAMAX) 70 MG tablet with full glass of water on empty stomach-nothing else by mouth and do not lie down for 1/2 hour Disp: 4 tablet Rfl: 11   meloxicam (MOBIC) 15 MG tablet Take 1 tablet by mouth daily as needed for Pain Disp: 90 tablet Rfl: 2   citalopram (CELEXA) 20 MG tablet Take 1 tablet by mouth daily Disp: 30 tablet Rfl: 2   LORazepam (ATIVAN) 0.5 MG tablet Take 0.5 mg by mouth nightly as needed Disp:  Rfl: 0   fexofenadine (ALLEGRA) 180 MG tablet Take 1 tablet by mouth daily Disp: 30 tablet Rfl: 11   fluticasone (FLONASE) 50 MCG/ACT nasal spray 1 spray by Each Nostril route daily Disp: 16 g Rfl: 3  No current facility-administered medications for this visit.     Providers and Suppliers (See demographics, encounters tab, filter by provider)  Fredirick Lathe, MD    [id number] [place] [place] Phone: [phone]: [phone]   Specialists:  ENT and acuptuncture    Screening:     Cognitive screen -  Mini cognitive test--Patient was given the following instructions  1. Repeat the following: APPLE, WATCH, PENNY  2. Draw the hours of a clock inside a circle, placing the hands of the clock to represent the time forty five minutes past ten oclock  3. Repeat the three words given previously    Scoring--Number of words recalled properly:  1-2: With normal clock drawing--Negative for cognitive impairment    Cognitive impairment is not detected byobservation, patient report and minicog   Depression screen, See Questions 3-4 of Health Risk Assessment.  Last PHQ9 total score (Recorded from the Screening Questions section):    PHQ-9 TOTAL SCORE 03/08/2016   Doc FlowSheet Total Score 4      Vision screen -    Eyes checked this year Hearing loss  screen -  Trouble hearing the television or radio when others can hear it? No  Straining or struggling to hear/understand conversations? No   Falls risk screen, See Questions 19-20 of Health Risk Assessment. Function screen, See Questions 8-13 of Health Risk Assessment.   Screen for overweight and hypertension:   BP 106/60   Pulse 84   Temp 97.8 F (36.6 C) (Temporal)   Ht 5' 1.61" (1.565 m)   Wt 60.8 kg (134 lb)   LMP  (LMP Unknown)   SpO2 97%   BMI 24.82 kg/m2  Body mass index is 24.82 kg/(m^2).   One-time screening for abdominal aortic aneurysm (AAA) by ultrasonography in men aged 654to 753who have ever smoked:  Is not indicated in this female who reports that she has been smoking.  She has a 13.25 pack-year smoking history. She has never used smokeless tobacco.   Hepatitis B risk Assessment:    Because of lack of identifiable risk factors, her risk for hepatitis B is low (1) (moderate or high: consider Hep B vaccination)     Home safety screen:    Does home have throw rugs, poor lighting or slippery bathtub or shower? No  Does home LACK grab bars in bathrooms or handrails on stairs and steps? No  Does home LACK functioning smoke alarms? No   Health Risk Assessment: Recorded in the Screening Questions Section, can be reviewed in Flowsheets activity tab under '[place].     All above screening responses were reviewed, and all positive or abnormal responses were further evaluation or sent for referral as appropriate.    Counseling:     Advanced Care Planning:  The patient  does have the ability to prepare an advance directive  The patient has identified a Healthcare Proxy (recorded in demographics section)  Considering being a DNR but will think about it and has not talked to her son    Health Maintenance: See Health Maintenance Report for details of screening done.    Health Maintenance Items Overdue and Due Soon  HEP C SCREEN (DOB [birth year]) due on 0August 28, 1947 TDAP/TD VACCINE(1 - Tdap) due on  06/09/1958  HEP B HIGH RISK VACCINE EVAL (ONCE) due on 06/09/1965  ZOSTER VACCINE,AGE 32 AND OLDER (ONCE) due on 06/10/2007  PNEUMOCOCCAL VACCINE PPSV-23 due on 06/09/2012  PNEUMOCOCCAL VACCINE PCV-13  due on 06/09/2012  PHYSICAL EXAM (AGE 17+) due on 03/03/2016    All Health Maintenance Items and DueDates -  HEP C SCREEN (DOB [birth year]) due on 04[birth year]20/1946  TDAP/TD VACCINE(1 - Tdap) due on 06/09/1958  HEP B HIGH RISK VACCINE EVAL (ONCE) due on 06/09/1965  ZOSTER VACCINE,AGE 4 AND OLDER (ONCE) due on 06/10/2007  PNEUMOCOCCAL VACCINE PPSV-23 due on 06/09/2012  PNEUMOCOCCAL VACCINE PCV-13 due on 06/09/2012  PHYSICAL EXAM (AGE 17+) due on 03/03/2016  INFLUENZA VACCINE(1) due on 06/23/2016  FALL RISK ASSESSMENT: OVER 65 due on 01/23/2017  HEALTH CARE PROXY due on 02/08/2017  Smoking/Tobacco Cessation Counseling due on 02/08/2017  COLONOSCOPY due on 02/12/2017  AWQ Questionnaire due on 03/08/2017  MAMMOGRAPHY due on 03/06/2018  LIPID SCREENING due on 03/03/2020  BONE DENSITY due on 03/23/2030    Medicare GYN Exam:     GYN History:  post menopausal  Estrogen replacementtherapy: never  DES exposure: no  History of abnormal Papsmear: no  Family history of uterine or ovarian cancer: no  Regular self breast exam: yes  History of abnormal mammogram: no  Family history of breast cancer: no  Exam:   BP 106/60   Pulse 84   Temp 97.8 F (36.6 C) (Temporal)   Ht 5' 1.61" (1.565 m)   Wt 60.8 kg (134 lb)   LMP  (LMP Unknown)   SpO2 97%   BMI 24.82 kg/m2  GEN: well appearing, NAD  HEENT: PERLA, EOMI; TM intact without perforation or effusion, external canal normal. No significant cerumenosis noted;  Oral cavity, tongue, pharynx and palate have no inflammation or suspicious lesions. No tenderness to palpation of bilateral temples or TMJ joints  NECK: The neck is supple and free of adenopathy or masses- except L fat pad, the thyroid is normal without enlargement or nodules.  CARD: S1 and S2 normal, no murmurs, clicks, gallops or rubs.  Regular rate and rhythm.   CHEST: Chest is clear; no wheezes or rales.   ABD: The abdomen is soft without tenderness, guarding, mass, rebound or organomegaly. Bowel sounds are normal.  EXT: Extremities: extremities, peripheral pulses and reflexes normal.  MOOD: Mental status exam; she is alert, orient to time, person and place. Normal thought content, speech, affect, mood and dress are noted.      Assessment & Plan:     1. Encounter for Medicare annual wellness exam  Overall doing well  Complete exam and reviewed hx today    2. Chronic right-sided low back pain, with sciatica presence unspecified  Stable. Some improvement with acupuncture    3. Allergic rhinitis due to other allergic trigger, unspecified rhinitis seasonality  Chronic. May be contributing to some of her facial sx  Will trial fexofenadine in replacement of loratadine  - fexofenadine (ALLEGRA) 180 MG tablet; Take 1 tablet by mouth daily  Dispense: 30 tablet; Refill: 11    4. Tobacco use disorder  Chronic and cutting down. Will check CT for lung cancer screen. Pt is currently asymptomatic.   - CT Lung Cancer Screening [[id number]]; Future  - Counseling To Determ CT Lung Scr Elig [G0296]    5. Advance care planning  Discussed today. Pt is considering being a DNR  Has not talked to her son about her wishes and has not firmly decided  MOLST form and brochure given to the patient to review and discuss with her son if she would like      Personalized Instructions:   See Patient Instruction section

## 2016-05-03 ENCOUNTER — Encounter (HOSPITAL_BASED_OUTPATIENT_CLINIC_OR_DEPARTMENT_OTHER): Payer: Self-pay | Admitting: Family Medicine

## 2016-05-11 ENCOUNTER — Ambulatory Visit: Payer: Self-pay | Admitting: Family Medicine

## 2016-05-11 DIAGNOSIS — F17200 Nicotine dependence, unspecified, uncomplicated: Secondary | ICD-10-CM

## 2016-05-11 LAB — CT LUNG SCREENING (INITIAL)

## 2016-05-11 NOTE — Addendum Note (Signed)
Addended by: Evette Georges on: 05/11/2016 03:03 PM     Modules accepted: Orders

## 2016-05-12 ENCOUNTER — Telehealth (HOSPITAL_BASED_OUTPATIENT_CLINIC_OR_DEPARTMENT_OTHER): Payer: Self-pay | Admitting: Family Medicine

## 2016-05-12 DIAGNOSIS — R918 Other nonspecific abnormal finding of lung field: Secondary | ICD-10-CM | POA: Insufficient documentation

## 2016-05-12 NOTE — Progress Notes (Signed)
Pt informed by phone. See telephone encounter

## 2016-05-12 NOTE — Progress Notes (Signed)
Called pt and informed of multiple small nodules on CT and one more concerning nodule in LLL  Need repeat CT scan in 3 months  If stable, less worried about lesion and if changing, then more worried  Pt endorses understanding and will have repeat CT scan in the fall.     Ordered placed today and problem list and HM updated.

## 2016-05-15 ENCOUNTER — Other Ambulatory Visit (HOSPITAL_BASED_OUTPATIENT_CLINIC_OR_DEPARTMENT_OTHER): Payer: Self-pay | Admitting: Family Medicine

## 2016-05-15 DIAGNOSIS — R918 Other nonspecific abnormal finding of lung field: Secondary | ICD-10-CM

## 2016-05-18 ENCOUNTER — Ambulatory Visit (HOSPITAL_BASED_OUTPATIENT_CLINIC_OR_DEPARTMENT_OTHER): Payer: Medicare (Managed Care)

## 2016-05-18 DIAGNOSIS — R918 Other nonspecific abnormal finding of lung field: Secondary | ICD-10-CM

## 2016-05-18 LAB — BASIC METABOLIC PANEL
ANION GAP: 5 mmol/L (ref 5–15)
BUN (UREA NITROGEN): 10 mg/dL (ref 7–18)
CALCIUM: 8.4 mg/dL — ABNORMAL LOW (ref 8.5–10.1)
CARBON DIOXIDE: 29 mmol/L (ref 21–32)
CHLORIDE: 108 mmol/L — ABNORMAL HIGH (ref 98–107)
CREATININE: 0.8 mg/dL (ref 0.4–1.2)
ESTIMATED GLOMERULAR FILT RATE: >60 mL/min (ref >60)
Glucose Random: 90 mg/dL (ref 74–160)
POTASSIUM: 4.4 mmol/L (ref 3.5–5.1)
SODIUM: 142 mmol/L (ref 136–145)

## 2016-05-18 NOTE — Progress Notes (Signed)
Labs drawn

## 2016-05-19 ENCOUNTER — Encounter (HOSPITAL_BASED_OUTPATIENT_CLINIC_OR_DEPARTMENT_OTHER): Payer: Self-pay | Admitting: Family Medicine

## 2016-05-23 ENCOUNTER — Ambulatory Visit: Payer: Self-pay | Admitting: Family Medicine

## 2016-05-23 DIAGNOSIS — R918 Other nonspecific abnormal finding of lung field: Secondary | ICD-10-CM

## 2016-05-23 LAB — CT CHEST WO CONTRAST

## 2016-05-23 NOTE — Addendum Note (Signed)
Addended by: Evette Georges on: 05/23/2016 11:26 AM     Modules accepted: Orders

## 2016-05-23 NOTE — Progress Notes (Signed)
Non-urgent, leave for PCP.

## 2016-05-25 ENCOUNTER — Telehealth (HOSPITAL_BASED_OUTPATIENT_CLINIC_OR_DEPARTMENT_OTHER): Payer: Self-pay | Admitting: Family Medicine

## 2016-05-25 DIAGNOSIS — F419 Anxiety disorder, unspecified: Secondary | ICD-10-CM

## 2016-05-25 DIAGNOSIS — J310 Chronic rhinitis: Secondary | ICD-10-CM

## 2016-05-25 DIAGNOSIS — J3089 Other allergic rhinitis: Secondary | ICD-10-CM

## 2016-05-25 NOTE — Telephone Encounter (Signed)
-----   Message from Delene Ruffini sent at 05/25/2016 10:53 AM EDT -----  Regarding: Questions regarding lab results  Contact: [place] [phone], 70 year old, female    Calls today:  Clinical Questions ([place])    Specific nature of request Questions regarding lab results  Return phone number [phone]  Person calling on behalf of patient: Patient (self)    Patient's PCP: Fredirick Lathe, MD

## 2016-05-26 MED ORDER — CITALOPRAM HYDROBROMIDE 20 MG PO TABS
20.0000 mg | ORAL_TABLET | Freq: Every day | ORAL | 1 refills | Status: DC
Start: 2016-05-26 — End: 2016-10-19

## 2016-05-26 MED ORDER — FEXOFENADINE HCL 180 MG PO TABS
180.0000 mg | ORAL_TABLET | Freq: Every day | ORAL | 3 refills | Status: DC
Start: 2016-05-26 — End: 2017-05-30

## 2016-05-26 MED ORDER — FLUTICASONE PROPIONATE 50 MCG/ACT NA SUSP
1.0000 | Freq: Every day | NASAL | 3 refills | Status: DC
Start: 2016-05-26 — End: 2016-12-22

## 2016-05-26 NOTE — Progress Notes (Signed)
Spoke with patient, concerned about CT scan would like to speak with PCP about results.

## 2016-05-26 NOTE — Telephone Encounter (Signed)
-----   Message from Delene Ruffini sent at 05/26/2016  2:50 PM EDT -----  Regarding:  Questions regarding lab results-very concerned  Contact: [phone]  Regarding: Questions regarding lab results-very concerned  Contact: [place] [phone], 70 year old, female    Calls today:  Clinical Questions ([place])    Specific nature of request Questions regarding lab results  Return phone number [phone]  Person calling on behalf of patient: Patient (self)    Patient's PCP: Fredirick Lathe, MD

## 2016-05-26 NOTE — Progress Notes (Signed)
Pt informed by phone. See telephone encounter

## 2016-05-26 NOTE — Telephone Encounter (Signed)
-----   Message from Delene Ruffini sent at 05/26/2016  2:50 PM EDT -----  Regarding:  Questions regarding lab results-very concerned  Contact: [phone]  Regarding: Questions regarding lab results-very concerned  Contact: [place] [phone], 70 year old, female    Calls today:  Clinical Questions ([place])    Specific nature of request Questions regarding lab results  Return phone number [phone]  Person calling on behalf of patient: Patient (self)    Patient's PCP: Fredirick Lathe, MD

## 2016-05-26 NOTE — Progress Notes (Signed)
Called patient and explain CT chest was stable  Unfortunately it was scheduled now and not in 3 months. I apologized that she got additional testing and radiation and that I would look into it. Reassured that nothing had changed on the CT scan.  She will still need an exam in 3 months  Will wait to order it.    Also reassured her BMP was normal and described that sometimes it is drawn if contrast is needed but no contrast was used    Refilled her medications for 3 months since she will be going to [place] in Sept for a short trip    She thanked me for the call.

## 2016-06-07 ENCOUNTER — Ambulatory Visit (HOSPITAL_BASED_OUTPATIENT_CLINIC_OR_DEPARTMENT_OTHER): Payer: Medicare (Managed Care) | Admitting: Otolaryngology

## 2016-06-22 ENCOUNTER — Encounter (HOSPITAL_BASED_OUTPATIENT_CLINIC_OR_DEPARTMENT_OTHER): Payer: Self-pay | Admitting: Otolaryngology

## 2016-06-22 ENCOUNTER — Ambulatory Visit (HOSPITAL_BASED_OUTPATIENT_CLINIC_OR_DEPARTMENT_OTHER): Payer: Medicare (Managed Care) | Admitting: Otolaryngology

## 2016-06-22 DIAGNOSIS — K219 Gastro-esophageal reflux disease without esophagitis: Secondary | ICD-10-CM

## 2016-06-22 DIAGNOSIS — J381 Polyp of vocal cord and larynx: Secondary | ICD-10-CM

## 2016-06-22 MED ORDER — AMOXICILLIN 500 MG PO CAPS
500.00 mg | ORAL_CAPSULE | Freq: Three times a day (TID) | ORAL | 0 refills | Status: AC
Start: 2016-06-22 — End: 2016-07-02

## 2016-06-22 NOTE — Progress Notes (Signed)
HPI: 70 year old female Victoria Macdonald is a followup for reinke's edema. She saw Dr. Merrilee Jansky 03/06/16 for dysphonia. She is not on PPI's anymore or H2 blockers. She uses dandelion root and says this helps w/ her GI symptoms. She says her dysphonia is much improved.     She was treated w/ a sinus infection in the ER w/ amoxil. This helped her ear pain resolve. She uses flonase daily which has been helping her nasal congestion.     Overall she is doing better. She is down to smoking 5 cigarettes per day. She has a h/o of large sebaceous cyst removed from left neck attached to voice box in [place].     She has frontal headaches every morning that resolve after she stands up. She requests imaging.       Past medical, social and family history reviewed in EPIC.    Review of Systems -   Fever No  Cough No  Easy Bruising No    EXAM  General: WD WN female with a normal voice.  Face: No lesions  Facial Strength: Normal and symmetric.  Eyes: PERRLA and EOMI.  Ear R: No lesions. Cerumen: none / minimal. Canal clear. TM clear.  Ear L:  No lesions. Cerumen: none / minimal. Canal clear. TM clear.  Nose:  Septum midline.  Turbinates normal size.  No polyps or masses.  Nasopharynx:  CNV.    Oral Cavity/Oropharynx:  Mucous membranes moist.  Tonsils 0+.  Teeth in good condition.  Tongue no lesions.  Hypopharynx: Base of tongue no masses. Pyriform sinuses clear.  Larynx: Epiglottis no edema or lesions. Vocal cords: CNV.  Neck:  Trachea midline.  No palpable masses.  Thyroid: Normal to palpation.  Lymph:  No palpable nodes.  Subman glands:  Normal size, non-tender  Parotids: Normal size, no masses or nodes.    A/P  70 year old female presents for reassessment of dysphonia, ETD. Continue flonase daily. Continue diet and lifestyle modifications. Follow up in 3 months for laryngoscopy to continue to monitor Reinke's.

## 2016-06-22 NOTE — Progress Notes (Signed)
I interviewed and examined the patient personally. I agree with the assessment and plan of Jaime Silva, PA-C.    Caidence Kaseman N. Raven Harmes, MD, MBA  Otolaryngology- Head and Neck Surgery

## 2016-08-21 ENCOUNTER — Other Ambulatory Visit (HOSPITAL_BASED_OUTPATIENT_CLINIC_OR_DEPARTMENT_OTHER): Payer: Self-pay | Admitting: Family Medicine

## 2016-08-21 DIAGNOSIS — R918 Other nonspecific abnormal finding of lung field: Secondary | ICD-10-CM

## 2016-08-21 NOTE — Progress Notes (Signed)
Placed new order for CT chest for lung nodule monitoring. Had discussed with patient this summer

## 2016-08-28 ENCOUNTER — Telehealth (HOSPITAL_BASED_OUTPATIENT_CLINIC_OR_DEPARTMENT_OTHER): Payer: Self-pay | Admitting: Family Medicine

## 2016-08-28 NOTE — Progress Notes (Signed)
Called the pt to let her know to change / correct her name with the Insurance from [place] to [place] submit the orders until the pt changes this information.  Gave her the number for pre reg and Senior whole health insurance to call and change . Pt will call .  Margarita Mail, 08/28/2016, 10:49 AM

## 2016-09-21 ENCOUNTER — Ambulatory Visit (HOSPITAL_BASED_OUTPATIENT_CLINIC_OR_DEPARTMENT_OTHER): Payer: Medicare (Managed Care) | Admitting: Otolaryngology

## 2016-09-26 ENCOUNTER — Ambulatory Visit: Payer: Self-pay | Admitting: Family Medicine

## 2016-09-26 DIAGNOSIS — R918 Other nonspecific abnormal finding of lung field: Secondary | ICD-10-CM

## 2016-09-26 LAB — CT CHEST WO CONTRAST

## 2016-09-26 NOTE — Addendum Note (Signed)
Addended byArdelle Balls on: 09/26/2016 11:26 AM     Modules accepted: Orders

## 2016-09-27 ENCOUNTER — Telehealth (HOSPITAL_BASED_OUTPATIENT_CLINIC_OR_DEPARTMENT_OTHER): Payer: Self-pay | Admitting: Family Medicine

## 2016-09-27 DIAGNOSIS — R918 Other nonspecific abnormal finding of lung field: Secondary | ICD-10-CM

## 2016-09-27 NOTE — Progress Notes (Signed)
Called patient  Still having pain in heads on the sides  Not getting dizzy or any noise in her ears  Missed her appt with ENT in November because ride did not come in time  Rescheduled for January. Advised to keep appointment.     Reviewed lung nodules on CT are unchanged but largest one in LLL has partial solid component and biopsy recommended.  Referred her to thoracic surgeon to discuss at Lung nodule review and complete biopsy as needed.     Pt endorses understanding and thanked me for the call.

## 2016-09-27 NOTE — Progress Notes (Signed)
Pt informed by phone. See telephone encounter

## 2016-09-28 ENCOUNTER — Encounter (HOSPITAL_BASED_OUTPATIENT_CLINIC_OR_DEPARTMENT_OTHER): Payer: Self-pay | Admitting: Family Medicine

## 2016-10-06 ENCOUNTER — Ambulatory Visit (HOSPITAL_BASED_OUTPATIENT_CLINIC_OR_DEPARTMENT_OTHER): Payer: Medicare (Managed Care) | Admitting: Thoracic Surgery (Cardiothoracic Vascular Surgery)

## 2016-10-06 ENCOUNTER — Telehealth (HOSPITAL_BASED_OUTPATIENT_CLINIC_OR_DEPARTMENT_OTHER): Payer: Self-pay | Admitting: Ambulatory Care

## 2016-10-06 NOTE — Progress Notes (Signed)
Called and spoke to patient and she had to miss the appt with Dr. Leonor Liv today because she is in [place]. She will call them to reschedule.    She is in [place] until 12/16.  A few days ago she had been sick with a cough and fever and went to the ER in [place] and they diagnosed her with the beginning of pneumonia and put her on antibiotic.  She wanted to let D.D'Agata know this.

## 2016-10-06 NOTE — Telephone Encounter (Signed)
-----   Message from Valentina Gu sent at 10/06/2016  9:52 AM EST -----  Regarding: Appt Concerns   Contact: [place] [phone], 70 year old, female    Calls today:  Clinical Questions ([place])    Name of person calling Patient   Specific nature of request Pt is calling wanting to speak with nurse about medical speciality appt. Please advise   Return phone number [phone]  Person calling on behalf of patient: Patient (self)      Patient's language of care: English    Patient does not need an interpreter.    Patient's PCP: Fredirick Lathe, MD

## 2016-10-19 ENCOUNTER — Other Ambulatory Visit (HOSPITAL_BASED_OUTPATIENT_CLINIC_OR_DEPARTMENT_OTHER): Payer: Self-pay | Admitting: Family Medicine

## 2016-10-19 DIAGNOSIS — M5442 Lumbago with sciatica, left side: Secondary | ICD-10-CM

## 2016-10-19 DIAGNOSIS — F419 Anxiety disorder, unspecified: Secondary | ICD-10-CM

## 2016-10-19 DIAGNOSIS — G8929 Other chronic pain: Secondary | ICD-10-CM

## 2016-10-19 NOTE — Progress Notes (Signed)
PER Pharmacy, Victoria Macdonald is a 70 year old female has requested a refill of      - Citalopram    - Meloxicam      Last Office Visit: 05/02/2016 with Fredirick Lathe  Last Physical Exam: 05/02/2016      Other Med Adult:  Most Recent BP Reading(s)  05/02/16 : 106/60          Cholesterol (mg/dL)   Date Value   03/04/2015 235   ----------    LOW DENSITY LIPOPROTEIN DIRECT (mg/dL)   Date Value   03/04/2015 171   ----------    HIGH DENSITY LIPOPROTEIN (mg/dL)   Date Value   03/04/2015 45   ----------  No results found for: TG      No results found for: TSHSC      No results found for: TSH    No results found for: HGBA1C    No results found for: POCA1C      No results found for: INR      SODIUM (mmol/L)   Date Value   05/18/2016 142   ----------      POTASSIUM (mmol/L)   Date Value   05/18/2016 4.4   ----------          CREATININE (mg/dL)   Date Value   05/18/2016 0.8   ----------      Documented patient preferred pharmacies:    [id number] [place] [place] [address] Phone: [phone]: [phone]

## 2016-10-20 ENCOUNTER — Encounter (HOSPITAL_BASED_OUTPATIENT_CLINIC_OR_DEPARTMENT_OTHER): Payer: Self-pay | Admitting: Thoracic Surgery (Cardiothoracic Vascular Surgery)

## 2016-10-20 ENCOUNTER — Ambulatory Visit (HOSPITAL_BASED_OUTPATIENT_CLINIC_OR_DEPARTMENT_OTHER): Payer: Medicare (Managed Care) | Admitting: Thoracic Surgery (Cardiothoracic Vascular Surgery)

## 2016-10-20 VITALS — BP 142/75 | HR 85

## 2016-10-20 DIAGNOSIS — R918 Other nonspecific abnormal finding of lung field: Secondary | ICD-10-CM

## 2016-10-20 MED ORDER — BENZONATATE 100 MG PO CAPS
100.0000 mg | ORAL_CAPSULE | Freq: Three times a day (TID) | ORAL | 1 refills | Status: AC | PRN
Start: 2016-10-20 — End: 2016-11-03

## 2016-10-20 MED ORDER — BENZONATATE 100 MG PO CAPS: 100 mg | capsule | Freq: Three times a day (TID) | ORAL | 1 refills | 0 days | Status: AC | PRN

## 2016-10-20 NOTE — Progress Notes (Signed)
-THORACIC SURGERY CONSULT NOTE -    Reason for Consult: Lung nodules      Consult Requested by: Fredirick Lathe, MD      HPI:    The patient is a 70 year old female smoker who was referred for evaluation of lung nodules. She underwent a LCS CT in 04/2016 and has had 2 subsequent CT scans (05/2016 and 09/2016).  She denies new cough, hemoptysis, weight loss, new headache, or bone pain.      Past Medical History:   Past Medical History:  No date: Back pain  05/13/2015: LLQ pain    Past Surgical History:   Past Surgical History:  ~1985: ABDOMINAL SURGERY  ~1975: TUBAL LIGATION    Medications:     Current Outpatient Prescriptions on File Prior to Visit:  citalopram (CELEXA) 20 MG tablet Take 1 tablet by mouth daily Disp: 90 tablet Rfl: 3   meloxicam (MOBIC) 15 MG tablet Take 1 tablet by mouth daily as needed for Pain Disp: 90 tablet Rfl: 3   fexofenadine (ALLEGRA) 180 MG tablet Take 1 tablet by mouth daily Disp: 90 tablet Rfl: 3   fluticasone (FLONASE) 50 MCG/ACT nasal spray 1 spray by Each Nostril route daily Disp: 2 Bottle Rfl: 3   [facility] NTS STEP 1 21 MG/24HR PLACE 1 PATCH ONTO THE SKIN DAILY Disp: 28 patch Rfl: 0   alendronate (FOSAMAX) 70 MG tablet with full glass of water on empty stomach-nothing else by mouth and do not lie down for 1/2 hour Disp: 4 tablet Rfl: 11   LORazepam (ATIVAN) 0.5 MG tablet Take 0.5 mg by mouth nightly as needed Disp:  Rfl: 0     No current facility-administered medications on file prior to visit.     Allergies:   Review of Patient's Allergies indicates:   Lactose intolerance*    Other (See Comments)    Comment:bloating    Social History:    Activity:  Can walk up 2 filghts of stairs and 4 blocks on a flat surface with out stopping for dyspnea    Tobacco Use:     Smoking status: Current Every Day Smoker 0.25 Packs/day For 53.00 Years       Alcohol:     Alcohol use No       Family History:     Family History    Cancer - Other Sister     Comment: Uterus, ovarian, intestinal, liver     Glaucoma Brother     OTHER Sister     Comment: Dementia        Review of Systems:   Systems reviewed and all systems negative except  Resp chronic cough, bronchitis    Physical Exam:   BP 142/75  Pulse 85  LMP  (LMP Unknown)  General appearance: alert, appears stated age and cooperative  Head: Normocephalic, without obvious abnormality, atraumatic  Eyes:  conjunctivae/corneas clear. PERRL, EOM's intact. Fundi benign.  Neck: no adenopathy, no carotid bruit, no JVD, supple, symmetrical, trachea midline and thyroid not enlarged, symmetric, no tenderness/mass/nodules  Lungs: clear to auscultation bilaterally  Heart:  regular rate and rhythm, S1, S2 normal, no murmur, click, rub or gallop  Abdomen: soft, non-tender; bowel sounds normal; no masses,  no organomegaly  Neurologic: Grossly normal       Imaging:       Component   RAD [facility]   [place], [place] [address]   [phone]  Department of Diagnostic Radiology   PATIENT: [name],[name]    DOB: Dec 20, 1945 AGE: 60  SEX: F    ACCT#: [id number] LOCATION: [facility]    UNIT#: [id number] STATUS: REG REF    ORD PHY: CAITLIN G. D'AGATA MD      Exam Date: 09/26/16  Exam Status: Signed      Exam: CT CHEST WO IV CONTRAST    Reason for Exam: Monitoring lung nodules from previous CT.          INDICATION: Monitoring lung nodules from previous CT      TECHNIQUE: CT scan of the chest was performed without intravenous    contrast.      Radiation dose: Radiation dose reduction techniques were employed.    CTDIvol: 2.4 mGy. DLP: 80 mGy-cm.      COMPARISON: Chest CT May 11, 2016; chest CT May 23, 2016      FINDINGS:      Lungs: 6 mm groundglass nodule right upper lobe, image 92 of series 3.    8 mm groundglass nodule left lower lobe, image 119. 6 mm groundglass    nodule left lower lobe, image 113. 17 mm groundglass nodule with a 6    mm region of increased density or partially solid  component as before    left lower lobe, image 139.      Airways: Patent      Pleura: No pleural effusion      Mediastinum: Unremarkable      Lymph nodes: No adenopathy      Soft tissues: Unremarkable      Vasculature: No aortic aneurysm. No cardiomegaly. There are coronary    artery and aortic calcifications.      Upper abdomen: 13 mm hypodensity in the spleen, unchanged from the    abdominopelvic CT of 2016.    Bones: Mild degenerative changes of the spine.      IMPRESSION: No change in groundglass pulmonary nodules. The dominant    nodule in the left lower lobe has an area of increased density or    partially solid component as before and is unchanged in size.    Recommend biopsy of this lesion. Continued yearly CT surveillance for    5 years is recommended.          Dictated By:                  Gunnar Fusi MD    Reviewed and Electronically Signed By:  Gunnar Fusi, MD    Technologist: [email or web address]   Signed Date/Time: 09/26/16 1304   Transcribed Date/Time: 09/26/16 1242 by Trula Slade   Printed Date/Time:    Report #: [id number]   Addendum Transcribed Date/Time: /   Addendum Signed Date/Time    CC:                  Impression & Recommendations:   35M with ground glass nodules. CT was read as increased solid component of the 17 mm nodule in the LLL, however I do not feel this is changed since her initial LCS CT in 04/2016.    Follow up CT chest in 6 months and visit with me after.  Smoking cessation recommended, she is doing acupuncture and the nicotine patch. Smoking 5 cigs/day currently.     Leonor Liv, MD, 10/20/2016, 9:38 AM

## 2016-10-20 NOTE — Patient Instructions (Signed)
I have recommended a CT chest in 6 months and would like to see you in follow up after the scan.

## 2016-10-26 ENCOUNTER — Ambulatory Visit (HOSPITAL_BASED_OUTPATIENT_CLINIC_OR_DEPARTMENT_OTHER): Payer: Medicare (Managed Care) | Admitting: Otolaryngology

## 2016-11-02 ENCOUNTER — Ambulatory Visit (HOSPITAL_BASED_OUTPATIENT_CLINIC_OR_DEPARTMENT_OTHER): Payer: Medicare (Managed Care) | Admitting: Otolaryngology

## 2016-11-02 ENCOUNTER — Encounter (HOSPITAL_BASED_OUTPATIENT_CLINIC_OR_DEPARTMENT_OTHER): Payer: Self-pay | Admitting: Otolaryngology

## 2016-11-02 DIAGNOSIS — J320 Chronic maxillary sinusitis: Secondary | ICD-10-CM

## 2016-11-02 DIAGNOSIS — J302 Other seasonal allergic rhinitis: Secondary | ICD-10-CM

## 2016-11-02 NOTE — Progress Notes (Signed)
HPI: 71 year old female Victoria Macdonald is a followup for chronic sinusitis and with chronic frontal sinusitis and with pain and pressure- longstanding history of allergies.     And here to see if that can be tested as significant allergic history and with no acute sinusitis. And her left sided sialadenitis and done well.     Past medical, social and family history reviewed in EPIC.    Review of Systems -   Fever No  Cough No  Wheeze No  Easy Bruising No  Blurred Vision No  Palpitations No  Heartburn No  Stiff Neck No  Headache No  Skin Lesions of the Face No    EXAM  General: WD WN female with a good voice.  Face: No lesions  Facial Strength: Normal and symmetric.  Eyes: PERRLA and EOMI.  Ear R: No lesions. Cerumen: none / minimal. Canal clear. TM clear.  Ear L:  No lesions. Cerumen: none / minimal. Canal clear. TM clear.  Nose:  Septum midline.  Turbinates normal size.  No polyps or masses.  Nasopharynx:  CNV    Oral Cavity/Oropharynx:  Mucous membranes moist.  Tonsils 0+.  Teeth in good condition.  Tongue no lesions.  Neck:  Trachea midline.  No palpable masses.  Thyroid: Normal to palpation.  Lymph:  No palpable nodes.  Subman glands:  Normal size, non-tender  Parotids: Normal size, no masses or nodes.    A/P  71 year old female with chronic allergic rhinitis and with allergy testing and with failure of medical management. And not on Beta blockers.     Patient had all their questions answered and concerns addressed. Will call if any issues arise, otherwise will see this patient in 4-6 weeks.     Claudie Revering, MD  Otolaryngology-Head and Neck Surgery  Rhinology and Allergy

## 2016-12-22 ENCOUNTER — Other Ambulatory Visit (HOSPITAL_BASED_OUTPATIENT_CLINIC_OR_DEPARTMENT_OTHER): Payer: Self-pay | Admitting: Family Medicine

## 2016-12-22 DIAGNOSIS — J310 Chronic rhinitis: Secondary | ICD-10-CM

## 2016-12-22 NOTE — Progress Notes (Signed)
PER Pharmacy, Victoria Macdonald is a 71 year old female has requested a refill of      - Flonase       Last Office Visit: 05/02/2016 with Fredirick Lathe  Last Physical Exam: 05/02/2016      Other Med Adult:  Most Recent BP Reading(s)  10/20/16 : 142/75          Cholesterol (mg/dL)   Date Value   03/04/2015 235   ----------    LOW DENSITY LIPOPROTEIN DIRECT (mg/dL)   Date Value   03/04/2015 171   ----------    HIGH DENSITY LIPOPROTEIN (mg/dL)   Date Value   03/04/2015 45   ----------  No results found for: TG      No results found for: TSHSC      No results found for: TSH    No results found for: HGBA1C    No results found for: POCA1C      No results found for: INR      SODIUM (mmol/L)   Date Value   05/18/2016 142   ----------      POTASSIUM (mmol/L)   Date Value   05/18/2016 4.4   ----------          CREATININE (mg/dL)   Date Value   05/18/2016 0.8   ----------      Documented patient preferred pharmacies:    [id number] [place] [place] Phone: [phone]: [phone]

## 2016-12-22 NOTE — Progress Notes (Signed)
Covering provider. Script approved, sent to pharmacy via e-prescribing.

## 2017-01-17 ENCOUNTER — Ambulatory Visit (HOSPITAL_BASED_OUTPATIENT_CLINIC_OR_DEPARTMENT_OTHER): Payer: Medicare (Managed Care)

## 2017-02-07 ENCOUNTER — Encounter (HOSPITAL_BASED_OUTPATIENT_CLINIC_OR_DEPARTMENT_OTHER): Payer: Self-pay | Admitting: Pulmonary

## 2017-02-07 DIAGNOSIS — R918 Other nonspecific abnormal finding of lung field: Secondary | ICD-10-CM

## 2017-02-07 NOTE — Progress Notes (Signed)
Problems List, Letters and/or HM updated by Benjamin Zimetbaum, Lung Nodule Coordinator  -Pulmonary.

## 2017-02-08 ENCOUNTER — Ambulatory Visit (HOSPITAL_BASED_OUTPATIENT_CLINIC_OR_DEPARTMENT_OTHER): Payer: Medicare (Managed Care) | Admitting: Family Medicine

## 2017-02-08 ENCOUNTER — Encounter (HOSPITAL_BASED_OUTPATIENT_CLINIC_OR_DEPARTMENT_OTHER): Payer: Self-pay | Admitting: Family Medicine

## 2017-02-08 VITALS — BP 116/70 | HR 86 | Temp 98.1°F | Wt 125.0 lb

## 2017-02-08 DIAGNOSIS — K635 Polyp of colon: Secondary | ICD-10-CM

## 2017-02-08 DIAGNOSIS — K219 Gastro-esophageal reflux disease without esophagitis: Secondary | ICD-10-CM

## 2017-02-08 MED ORDER — RANITIDINE HCL 150 MG PO TABS
150.0000 mg | ORAL_TABLET | Freq: Two times a day (BID) | ORAL | 2 refills | Status: DC
Start: 2017-02-08 — End: 2017-04-06

## 2017-02-08 MED ORDER — RANITIDINE HCL 150 MG PO TABS: 150 mg | tablet | Freq: Two times a day (BID) | ORAL | 2 refills | 0 days | Status: DC

## 2017-02-08 NOTE — Patient Instructions (Signed)
Appointment scheduled for:    Reason for Visit:   Reason for not being seen at [facility]:     Specialty Location:                                                     Specialtist's name:                                      Specialty Phone Number:    Specialty Fax Number:       Day of appt:                                     Date of appt:     Time of appt:

## 2017-02-08 NOTE — Progress Notes (Signed)
Family Medicine Office Note    HPI:    GERD:  Changed diet - eating salads, broccoli and lean meats  Losing weight. Still having sx every night  Not as severe  Dandelion was helping but now it is not  Sometimes feels choking sensation with food  Cheese and sweets makes it worse  Denies black stools or bloody stools  No known EGD  Pt has hx of sessile polyps. Due for colonoscopy this year    Excess upper eyelid skin:  Today is best day but sometimes blocks her vision  Her insurance said it will cover it with a referral  She has an eye appointment on April 23 but not sure where.   Has the information at thome    EXAM:  BP 116/70 (Site: LA, Position: Sitting, Cuff Size: Reg)  Pulse 86  Temp 98.1 F (36.7 C) (Temporal)  Wt 56.7 kg (125 lb)  LMP  (LMP Unknown)  SpO2 97%  BMI 23.15 kg/m2  GEN: Well appearing, NAD  EYES: no drooping excess skin on exam though skin is able to be stretched   ABD: TTP in LLQ and epigastric area. No mass, guarding or rebound  MOOD: Normal thought content, speech, affect, mood and dress are noted.    ASSESSMENT/PLAN:    Victoria Macdonald is a 71 year old female who presents for    1. Gastroesophageal reflux disease without esophagitis  Improved but persistent sx. Given chronicity of sx, will check for esophagitis or lesions with EGD. Potential interaction between PPI and citalopram so will refill ranitidine for now.   - REFERRAL TO OPEN ACCESS UPPER ENDOSCOPY (EGD)  - raNITIdine (ZANTAC) 150 MG tablet; Take 1 tablet by mouth 2 (two) times daily  Dispense: 60 tablet; Refill: 2    2. Sessile colonic polyp  Due for follow up colonoscopy  - REFERRAL TO OPEN ACCESS COLONOSCOPY    Pt is aware she is due for repeat CT lung this summer.     **AVS given and patient instructions reviewed with patient, who had the opportunity to ask questions. The pt verbalizes and demonstrated understanding. Advised to call/RTC with concerns.

## 2017-02-09 ENCOUNTER — Telehealth (HOSPITAL_BASED_OUTPATIENT_CLINIC_OR_DEPARTMENT_OTHER): Payer: Self-pay | Admitting: Family Medicine

## 2017-02-09 ENCOUNTER — Encounter (HOSPITAL_BASED_OUTPATIENT_CLINIC_OR_DEPARTMENT_OTHER): Payer: Self-pay

## 2017-02-09 ENCOUNTER — Other Ambulatory Visit (HOSPITAL_BASED_OUTPATIENT_CLINIC_OR_DEPARTMENT_OTHER): Payer: Self-pay

## 2017-02-09 DIAGNOSIS — Z1211 Encounter for screening for malignant neoplasm of colon: Secondary | ICD-10-CM

## 2017-02-09 DIAGNOSIS — H0230 Blepharochalasis unspecified eye, unspecified eyelid: Secondary | ICD-10-CM

## 2017-02-09 NOTE — Progress Notes (Signed)
Patient states she was told to call her PCP with referral information. Also wants to discuss with PCP

## 2017-02-09 NOTE — Telephone Encounter (Signed)
-----   Message from [facility] sent at 02/09/2017 10:51 AM EDT -----  Regarding: [facility]  Contact: [place] [phone], 71 year old, female    Calls today:  Clinical Questions ([place])      Specific nature of request pt state she spoke with pcp and pcp told her to call with the information where she is going to be seeing so she can place a referral in is going to be [facility] at [address] ,[place],[address] [phone] dr.shah sabera 02/12/17 10:30 am. Pt want to discuss with pcp about it. thanks  Return phone number [phone]  Person calling on behalf of patient: Patient (self)    Patient's language of care: English    Patient does not need an interpreter.    Patient's PCP: Fredirick Lathe, MD

## 2017-02-09 NOTE — Patient Instructions (Addendum)
Colonoscopy Standard       Division of Gastroenterology  [facility]  Phone:__________________  Address: [address], [place], [place] [address]    PLEASE READ CAREFULLY OR HAVE SOMEONE READ THIS TO YOU!      ____________________________________________  Name    Your Colonoscopy test is scheduled at _____________ Hospital    Day_______________     Time to Arrive _______________    Register ______________________            You are having a colonoscopy.  This test lets the doctor see the inside your large intestine (also called your colon) using a small camera on a flexible tube.  There are several reasons to have the test:   *Screening for colon cancers and polyps    *To check unexplained changes in bowel habits   *To evaluate abnormal X-ray findings   *To look for bleeding ulcers or other abnormalities of the colon lining                 HOW TO GET READY FOR THE TEST      Your prescription was sent to your pharmacy or given to you.    Please pick up your prescription within 1 week of receiving these instructions.     IF YOU ARE USING NULYTELY OR A DRUG LIKE IT, DO NOT ADD WATER TO THE PRESCRIPTION UNTIL 1 DAY BEFORE THE DAY OF YOUR EXAM.    Your prescription was sent to: _______________________________________    Call a friend or a family member to make sure you have   someone to take you home after your test.   You must have someone to go home with after the test or we will not be able to give you sedatives!      If you have any questions or worries, or if you are unsure about how to prepare for this test, please call _____________                                  DAY OF THE TEST    4 to 5 hours before your test, drink the remaining half of the bottle of laxative.    It is very important to finish the WHOLE GALLON.    The morning of the test, you can take your other medications at the usual time with a sip of water. These include blood pressure pills, seizure medications, heart medications,  thyroid medications, etc).     Don’t take pills for diabetes. Bring these medicines with you so that you can take them right after your test.      NOTHING to drink for 3 hours before the test.      IMPORTANT INFORMATION About Your Medicines  Based On Recommendations From Experts    IF YOU HAVE ANY CONCERNS ABOUT YOUR MEDICATIONS ASK YOUR OWN DOCTOR. DO NOT WAIT UNTIL THE DAY BEFORE THE TEST!!!!!      Your Medications  You can take your medications for high blood pressure, heart problems, or anxiety with a sip of water at your usual time.  Bring a list of your medications with you.     If you take medicines for Type 2 Diabetes:    One day before the test: If you are taking diabetes pills: Take PILLS in the morning only. If you take morning insulin: take your usual dose.    On the night before the   test: Do not take diabetes pills.  If you take insulin for type 2 diabetes, take ½ your usual long acting insulin.  For example: if you usually take 40 units of Lantus or NPH, take 20 units instead.    On the morning of the test: Do not take diabetes pills. If you are on long-acting insulin for type 2 diabetes, you can take half the dose. For example if you are on 40 units of Lantus or NPH, take 20 units instead.     After the test: You will be allowed to eat normally again. At that time, you should resume taking your diabetes pills at your usual times. If on insulin, take your usual evening dose of insulin. If you can’t eat for any reason, check with your doctor.    If you have Type 1 Diabetes:    One day before the test: Take your usual long-acting basal insulin (Lantus or NPH) and make sure your clear liquid diets contain sugar. Check your blood sugars at meal times and cover with short acting insulin only if blood sugar is over 200. Otherwise do not take your short-acting insulin.    On the night before the test: Just take your usual basal insulin dose that you would normally take at that time (for example, your usual  full dose of Lantus or NPH).    On the morning of the test: Take your usual basal insulin dose that you would normally take at that time (for example, your usual full dose of Lantus or NPH).                            Test Checklist    Please use this list to prepare for your test.     7 days before the test (____/____/____)     STOP taking Motrin, Advil, Naprosyn, Aleve or related drugs.   Continue to take all other prescribed meds.   Ask your doctor about whether you should continue Aspirin. It is sometimes        important to stay on this medication.    3 days before the test (____/____/____)     STOP eating any foods that contain beans, seeds, skins, nuts, or are high in fiber.    2 days before the test (____/____/____)     MAKE SURE YOU HAVE A RIDE ARRANGED    EAT dinner no later than 7 pm.  BEGIN a liquid diet. (Do not eat solids. This includes foods such as rice, pasta, bread and fruit).    1 day before the test (____/____/____)     PREPARE the laxative.   START taking the laxative at 6:00pm.   DRINK ½ the bottle of the laxative.    Day of your test (____/____/____)     FINISH  the rest of the laxative 5 to 6 hours before your test.     DO NOT drink anything 3 hours before the test.      WHAT TO EXPECT ON THE DAY OF YOUR TEST    Colonoscopy  Colonoscopy is a procedure used to see inside the colon and rectum.  During a colonoscopy a flexible tube with a camera and a light is inserted through the rectum. The doctor then examines the large bowel (also called the large intestine or colon).    Colonoscopies can detect inflamed tissue, ulcers and abnormal growths called polyps. Some polyps are cancerous, but most are “pre cancerous”.   These might become dangerous someday. A doctor can usually remove these polyps during the test. They are then sent to a laboratory to be checked. Polyps are common in adults and are generally harmless. However, most colorectal cancers begin as polyps. Removing them is a good way to  prevent cancer. Your doctor may also take biopsies (small pieces of tissue) for analysis in the lab of abnormalities that they want to check in more detail.    It is extremely important to follow all of the steps for cleaning out your colon. If you do not follow all the steps, the doctor may not be able to see clearly. The exam might need to be cancelled and repeated another day. The clear liquids you can take during the clean out are treated as food by your body, so you won’t starve or get dehydrated.        Getting Ready At The Hospital  On the day of your test, a nurse and doctor will ask you some more information about your health history.  You will be put into a hospital gown. A small intravenous needle will be inserted in the back of your hand or forearm to give you medications that will make you comfortable during the test.    In the procedure room, you will be asked to lie down in a curled position on your left side.  Please inform the doctor if this is uncomfortable for you. You will be given oxygen to breathe. The test usually lasts 30 minutes to an hour. It may last longer if polyps need to be removed or if other abnormalities are noted.      You will be given medication through the IV in order to control discomfort and help you relax.  You may sleep or be partially awake during the test. Sometimes, you might feel a cramping sensation. We will monitor you and try to make you as comfortable as possible. The tube will be inserted into your rectum (back side) and advanced through the large bowel.  The doctor will try and look at all of the inner walls of your colon. The outer wall and organs outside the colon are not visible with this test.    Possible Complications  Complications are unusual during or after the test but they can happen. The most common risks include colonic perforation (a tear in the colon), bleeding, respiratory problems, blood pressure problems, heart problems, discomfort and adverse  reactions to the medications used.  A perforation may result in the need for emergency surgery and a colostomy bag. Also note, colonoscopy like other medical tests is not perfect. It may not detect problems such as polyps, cancers and other diseases up to 2 to 6% of the time. Luckily, the combined risk of all of these problems is small.    After the Test  You may feel bloated from air which was put into your colon during the test. You may also feel a little drowsy from the medications. You cannot drive or operate heavy machinery or do any important work for the rest of the day. You should plan on resting, watching TV or reading light material after the test. You may forget things that happen during and directly after the test. It is important have someone with you that can remind you of any instructions we give.    Depending on what is found, you may need to have the colonoscopy repeated. Usually, this is done several years later but it may   need to be done much sooner. Talk to your doctor about when you should have repeat study or other testing.    You will usually be at the hospital between 2-3 hours (although sometimes it can take longer).  We will make sure that you are alright before sending you home. You must arrange for someone to drive you home after the test.  Once again, we will not perform the test unless you have an arranged ride. You cannot go home in a taxi or a bus.    Colonoscopy is a safe and effective test that is commonly done at our facilities. You may receive a call to remind you of the date and time of your test. If you have any questions, please feel free to call.     For questions about the test itself, call [phone].    For questions about the date and time of your test, or to change the date or time, call [phone]    For questions regarding your regular medications or health issues, please call your doctor.    Upper Endoscopy Prep    Upper endoscopy, also known as a gastroscopy or  EGD, is a procedure that uses an endoscope (a black flexible, lighted tube with a camera) to evaluate your esophagus, stomach and the first part of your small intestine (the duodenum). The endoscope allows direct viewing of a variety of diseases that can affect these areas. It can detect inflamed tissue, ulcers and abnormal growths. It often sees details that cannot be seen on x-ray examinations. Your doctor may take biopsies (small pieces of tissue) for analysis of abnormalities that they want to check in more detail.            To Prepare  You should not use Ibuprofen, Motrin, Advil, Naprosyn, Aleve or any other nonsteroidal antinflammatory agent a week before the test. Tylenol is ok for moderate pain relief. If you are on blood thinners (such as Aspirin, Warfarin, Coumadin, Heparin or Plavix) you must ask your doctor or nurse before stopping these medications!!! This important if you take your medicines for heart disease, stroke disease, a pulmonary embolus, artificial heart valves, a heart stent or a vascular stent and any other problems that could lead to a serious clot forming, stroke or heart attack. You may need to taper these medications depending on what is to be done. Ask your doctor!          You Should Not Eat Anything After Midnight The Night Before The Test.      Your medications:  The morning of the test, you can take your important medications (blood pressure pills, seizure medications, heart medications, thyroid medications, etc) at the usual time with a small sip of water.     Don’t take vitamins, antacids, supplements, pills for diabetes or iron pills the morning of the test.    If you have Type 2 Diabetes:    On the night before the test: Do not take diabetes pills.  If you take insulin for type 2 diabetes, take ½ your usual long acting insulin.  For example: if you usually take 40 units of Lantus or NPH, take 20 units instead.    On the morning of the test: Do not take diabetes pills. If you  are on long-acting insulin for type 2 diabetes, you can take half the dose. For example if you are on 40 units of Lantus or NPH, take 20 units instead.     After the   test: You will be allowed to eat again. At that time, should resume take your diabetes pills at your usual times. Take your usual evening dose of insulin.      If you have Type 1 Diabetes:    On the night before the test: Just take your usual basal insulin dose that you would normally take at that time (for example, your usual full dose of Lantus or NPH).    On the morning of the test: Take your usual basal insulin dose that you would normally take at that time (for example, your usual full dose of Lantus or NPH).      If you have any questions about your medications ask your own doctor or nurse.    Getting Ready At The Hospital    On the day of your test, a nurse will ask you some information about your health history.  You will be put into a hospital gown. A small intravenous needle will be inserted in the back of your hand or forearm to give you medications that will make you comfortable during the test. We may also ask you to gargle with some medication to anesthetize the back of your throat. In some people this reduces the chance of gagging, but may make your throat feel numb.    In the procedure room, you will be asked to lie down in on your left side. Please inform the doctor if this is uncomfortable for you. You will be started on oxygen which is routine. During the test you will be able to breathe normally around a plastic mouthpiece on which you can rest your teeth. You will then be asked to gently swallow the tube. The tube will be inserted into your mouth and advanced through your esophagus, stomach and upper small intestine.  The doctor will try and look at the inner walls of these areas. The outer wall and organs outside the esophagus, stomach and duodenum are not visible with this test.    You will be given medication through the IV in  order to control discomfort and help you relax. You may sleep or be partially awake during the test. ). A small amount of air is used to expand the stomach and duodenum so that the doctor can see. Secretions are removed by suction.  Sometimes, you might feel a gassy or gagging sensation. We will monitor you and try to make you as comfortable as possible.     Possible Complications    Complications are unusual during or after the test but they can happen. The most common risks include perforation (a tear in the throat, esophagus, stomach and small bowel), bleeding, respiratory problems, blood pressure problems, heart problems, discomfort and adverse reactions to the medications used.  A perforation may result in the need for emergency surgery. Also note, upper endoscopy like other medical tests is not perfect. It may not detect problems such as ulcers, cancers and other diseases up to 2 to 6% of the time. Luckily, the combined risk of all of these problems is small.    After the Test    You may feel bloated from air which was put into your stomach and intestine during the test. You may also feel a little drowsy from the medications. You cannot drive or operate heavy machinery or do any important work for the rest of the day. You should plan on resting, watching TV or reading light material after the test. You may forget things that happen during   and directly after the test. It is important have someone with you that can remind you of any instructions we give.    Depending on what is found, you may need to have the endoscopy repeated. Usually, this is done several years later but it may need to be done much sooner. Talk to your doctor about when you should have repeat study or other testing.    You will usually be at the hospital between 2-3 hours (although sometimes it can take longer).  We will make sure that you are alright before sending you home. You must arrange for someone to drive you home after the test.   Once again, we will not perform the test unless you have an arranged ride. You cannot go home in a taxi or a bus.    Endoscopy is a safe and effective test that is commonly done at our facilities. You may receive a call to remind you of the date and time of your test. If you have any questions, please feel free to call.     For questions about the test itself, call [phone].    For questions about the date and time of your test, or to change the date or time, call [phone]    For questions regarding your regular medications or health issues, please call your doctor.            Verbal instructions given over phone. Written instructions sent to patient in mail. Script sent to pharmacy.

## 2017-02-09 NOTE — Telephone Encounter (Signed)
Spoke with patient regarding scheduling colonoscopy. Procedure/preparation/need for ride explained. Assessment completed. Patient scheduled for an EGD/ colonoscopy with Dr Loleta Chance and moderate sedation on 02/21/17 for GERD and sessile polyp.

## 2017-02-12 ENCOUNTER — Telehealth (HOSPITAL_BASED_OUTPATIENT_CLINIC_OR_DEPARTMENT_OTHER): Payer: Self-pay | Admitting: Family Medicine

## 2017-02-12 MED ORDER — PEG 3350-KCL-NA BICARB-NACL 420 G PO SOLR
ORAL | 0 refills | Status: AC
Start: 2017-02-12 — End: 2017-04-10

## 2017-02-12 MED ORDER — PEG 3350-KCL-NA BICARB-NACL 420 G PO SOLR: Bottle | ORAL | 0 refills | 0 days | Status: AC

## 2017-02-12 NOTE — Progress Notes (Deleted)
.  Appointment scheduled for: Ophthalmology    Specialty Location:   [place]                                                   Specialist's name: Dr Jefm Miles        Specialist's NPI#: [id number]                           Specialty Phone Number: [phone]     Specialty Fax Number: [phone]    Reason for appointment: DM eye exam    Day of appt: Monday                                    Date of appt: 4.23.18    Time of appt: unk     Patient Notification:  Mychart message sent from pt      If you have any questions concerning the referral authorization please call [phone]    If you have any questions concerning your appointment please call the specialty phone number above.

## 2017-02-15 ENCOUNTER — Telehealth (HOSPITAL_BASED_OUTPATIENT_CLINIC_OR_DEPARTMENT_OTHER): Payer: Self-pay | Admitting: Ambulatory Care

## 2017-02-15 DIAGNOSIS — H0230 Blepharochalasis unspecified eye, unspecified eyelid: Secondary | ICD-10-CM

## 2017-02-15 NOTE — Progress Notes (Signed)
Called and spoke to patient. She states she trusts everything Dr.D'Agata tells her and she really wants her opinion.  She states she is sorry to call for so many questions.   Advised it is ok.    She states she went to her eye doctor and they want her to have cataract surgery but not until  July.  She states she is supposed to have eye lid surgery and she is wondering if she can have the eye lid surgery before her cataract and if so she needs a referral.

## 2017-02-15 NOTE — Telephone Encounter (Signed)
-----   Message from [place] sent at 02/15/2017  1:03 PM EDT -----  Regarding: cataract surgery   Victoria Macdonald [id number], 71 year old, female    Calls today:  Clinical Questions (NON-SICK CLINICAL QUESTIONS ONLY)    Specific nature of request wants to speak to pcp regarding upcoming cataract surgery.   Return phone number [phone]  Person calling on behalf of patient: Patient (self)    Patient's language of care: English    Patient's PCP: Fredirick Lathe, MD

## 2017-02-16 NOTE — Addendum Note (Signed)
Addended by: Fredirick Lathe on: 02/16/2017 04:37 PM     Modules accepted: Orders

## 2017-02-16 NOTE — Progress Notes (Signed)
Called patient  She has an appointment for cataract surgery in July  Eye surgeon recommends plastic surgeon for possible blepharoplasty    Pt is interested in [place] if available. Referral placed

## 2017-02-19 ENCOUNTER — Encounter (HOSPITAL_BASED_OUTPATIENT_CLINIC_OR_DEPARTMENT_OTHER): Payer: Self-pay | Admitting: Family Medicine

## 2017-02-21 ENCOUNTER — Ambulatory Visit (HOSPITAL_BASED_OUTPATIENT_CLINIC_OR_DEPARTMENT_OTHER)
Admit: 2017-02-21 | Disposition: A | Discharge status: Home / Self Care | Payer: Self-pay | Source: Ambulatory Visit | Attending: Gastroenterology | Admitting: Gastroenterology

## 2017-02-21 ENCOUNTER — Encounter (HOSPITAL_BASED_OUTPATIENT_CLINIC_OR_DEPARTMENT_OTHER): Payer: Self-pay | Admitting: Gastroenterology

## 2017-02-21 MED ORDER — OMEPRAZOLE 20 MG PO CPDR: 20 mg | capsule | Freq: Every day | ORAL | 0 refills | 0 days | Status: DC

## 2017-02-21 MED ORDER — FENTANYL CITRATE 0.05 MG/ML IJ SOLN
INTRAMUSCULAR | Status: DC
Start: 2017-02-21 — End: 2017-02-21
  Filled 2017-02-21: qty 4

## 2017-02-21 MED ORDER — MIDAZOLAM HCL 2 MG/2ML IJ SOLN
Freq: Once | INTRAMUSCULAR | Status: AC | PRN
Start: 2017-02-21 — End: 2017-02-21
  Administered 2017-02-21: 2 mg via INTRAVENOUS

## 2017-02-21 MED ORDER — MIDAZOLAM HCL 2 MG/2ML IJ SOLN
Freq: Once | INTRAMUSCULAR | Status: AC | PRN
Start: 2017-02-21 — End: 2017-02-21
  Administered 2017-02-21: 1 mg via INTRAVENOUS

## 2017-02-21 MED ORDER — DIPHENHYDRAMINE HCL 50 MG/ML IJ SOLN
INTRAMUSCULAR | Status: DC
Start: 2017-02-21 — End: 2017-02-21
  Filled 2017-02-21: qty 1

## 2017-02-21 MED ORDER — SODIUM CHLORIDE 0.9 % IV SOLN
INTRAVENOUS | Status: DC
Start: 2017-02-21 — End: 2017-02-21
  Administered 2017-02-21: 10:00:00 via INTRAVENOUS

## 2017-02-21 MED ORDER — OMEPRAZOLE 20 MG PO CPDR
20.0000 mg | DELAYED_RELEASE_CAPSULE | Freq: Every day | ORAL | 0 refills | Status: DC
Start: 2017-02-21 — End: 2017-03-15

## 2017-02-21 MED ORDER — FENTANYL CITRATE 0.05 MG/ML IJ SOLN
Freq: Once | INTRAMUSCULAR | Status: AC | PRN
Start: 2017-02-21 — End: 2017-02-21
  Administered 2017-02-21: 50 ug via INTRAVENOUS

## 2017-02-21 MED ORDER — FENTANYL CITRATE 0.05 MG/ML IJ SOLN
Freq: Once | INTRAMUSCULAR | Status: AC | PRN
Start: 2017-02-21 — End: 2017-02-21
  Administered 2017-02-21: 25 ug via INTRAVENOUS

## 2017-02-21 MED ORDER — DIPHENHYDRAMINE HCL 50 MG/ML IJ SOLN
Freq: Once | INTRAMUSCULAR | Status: AC | PRN
Start: 2017-02-21 — End: 2017-02-21
  Administered 2017-02-21: 25 mg via INTRAVENOUS

## 2017-02-21 MED ORDER — MIDAZOLAM HCL 5 MG/5ML IJ SOLN
INTRAMUSCULAR | Status: DC
Start: 2017-02-21 — End: 2017-02-21
  Filled 2017-02-21: qty 10

## 2017-02-21 NOTE — Narrator Note (Signed)
Moderate Sedation    Patient Care Timeline     Sedation: 02/21/2017 10:33     Time Event Details User    10:33:38 Sedation Start    Carmell Austria    10:33:59 Staff Arrived Adline Mango [Physician]; Willa Rough, Claudette [Scrub Tech]; Carmell Austria [Registered Nurse]   Carmell Austria    10:34:00 Other Flowsheet Documentation Pre-Sedation Confirmation    Emergency Equipment:  Emergency cart; Defibrillator; Cardiac Monitor; ETCO2; Airway - adult; Age appropriate AMBU bag; Intubation tray; Suction; Oxygen set up; Pulse oximeter    Other flowsheet entries    Attending Present in Area:  Yes Correct PATIENT?:  Yes   Availability of Necessary Equipment Confirmed:  Yes CONSENT, Surgery & H&P (complete and present):  Yes   Site Marked by a Surgeon or Resident MARK visible, if applicable:  N/A Correct SITE?:  Yes    Carmell Austria    10:34:53 MODERATE SEDATION    Carmell Austria    11:01:00 Devices Testing Template Device Data    Pulse:  83 Resp:  18   SpO2:  100 % BP:  142/70   MAP (mmHg):  94     Fred Hammes    11:01:00 Other Flowsheet Documentation Other flowsheet entries    Cardiac Rhythm:  NSR     Valencia Kassa    11:01:36 Medication Ordered and Given diphenhydrAMINE (BENADRYL) injection -  Dose:  25 mg ; Route:  Intravenous Ordered by: Jake Shark    11:01:42 Medication Ordered and Given midazolam (VERSED) injection -  Dose:  2 mg ; Route:  Intravenous Ordered by: Jake Shark    11:01:47 Medication Ordered and Given fentaNYL (SUBLIMAZE) injection -  Dose:  50 mcg ; Route:  Intravenous Ordered by: Jake Shark    11:01:52 [place] Mod Sedation [place] Details    [place] Behavioral Pain Scale (for sedated patients):  0 - Restful.  No facial expression Sedation Scale (Modified Ramsey):  5 - Alert    Carmell Austria    11:04:45 Medication Ordered and Given midazolam (VERSED) injection -  Dose:  1 mg ; Route:  Intravenous Ordered by: Jake Shark     11:04:49 Medication Ordered and Given fentaNYL (SUBLIMAZE) injection -  Dose:  25 mcg ; Route:  Intravenous Ordered by: Jake Shark    11:04:52 Pain Assessment Pain Assessment    Pain Assessment (Thermometer Scale):  No Pain (0)     Carmell Austria    11:04:57 [place]/Ram[place] Mod Sedation [place] Details    [place] Behavioral Pain Scale (for sedated patients):  0 - Restful.  No facial expression Sedation Scale (Modified Ramsey):  3 - Awakens easily    Carmell Austria    11:05:00 Devices Testing Template Device Data    Pulse:  82 (Device Time: 11:05:00) SpO2:  99 % (Device Time: 11:05:00)   BP:  142/70 (Device Time: 11:05:00) MAP (mmHg):  94    Alyanna Stoermer    11:05:00 Other Flowsheet Documentation Other flowsheet entries    Cardiac Rhythm:  NSR     E[place]y [place]    11:07:00 Devices Testing Template Device Data    Pulse:  88 (Device Time: 11:07:00) SpO2:  96 % (Device Time: 11:07:00)   BP:  145/64 (Device Time: 11:07:30) MAP (mmHg):  91   Additional Vitals (Critical Care Only)    AWRR:  0 (Device Time: 11:07:30)  Other flowsheet entries    ETCO2 (mmHg):  24 mmHg (Device Time: 11:07:30)     Carmell Austria    11:07:00 Other Flowsheet Documentation Other flowsheet entries    Cardiac Rhythm:  NSR     Carmell Austria    11:09:47 [place]/Ramsey Mod Sedation [place] Details    ONEOK Pain Scale (for sedated patients):  0 - Restful.  No facial expression Sedation Scale (Modified Ramsey):  3 - Awakens easily    Carmell Austria    11:09:51 Pain Assessment Pain Assessment    Pain Assessment (Thermometer Scale):  No Pain (0)     Carmell Austria    11:10:00 Devices Testing Template Device Data    Pulse:  77 (Device Time: 11:10:00) SpO2:  93 % (Device Time: 11:10:00)   BP:  120/59 (Device Time: 11:10:30) MAP (mmHg):  79.33   Additional Vitals (Critical Care Only)    AWRR:  13 (Device Time: 11:10:00)    Other flowsheet entries    ETCO2 (mmHg):  29 mmHg (Device Time: 11:10:00)     Carmell Austria    11:10:00 Other Flowsheet Documentation Other flowsheet entries    Cardiac Rhythm:  NSR     Carmell Austria    11:13:00 Devices Testing Template Device Data    Pulse:  80 (Device Time: 11:13:00) SpO2:  91 % (Device Time: 11:13:00)   BP:  106/60 (Device Time: 11:13:30) MAP (mmHg):  75.33   Additional Vitals (Critical Care Only)    AWRR:  8 (Device Time: 11:13:00)    Other flowsheet entries    ETCO2 (mmHg):  10 mmHg (Device Time: 11:13:00)     Carmell Austria    11:13:00 Other Flowsheet Documentation Other flowsheet entries    Cardiac Rhythm:  NSR     Carmell Austria    11:14:06 [place]/Ramsey Mod Sedation [place] Details    ONEOK Pain Scale (for sedated patients):  0 - Restful.  No facial expression Sedation Scale (Modified Ramsey):  3 - Awakens easily    Carmell Austria    11:14:12 Pain Assessment Pain Assessment    Pain Assessment (Thermometer Scale):  No Pain (0)     Carmell Austria    11:16:00 Devices Testing Template Device Data    Pulse:  80 (Device Time: 11:16:00) SpO2:  95 % (Device Time: 11:16:00)   BP:  118/54 (Device Time: 11:16:30) MAP (mmHg):  75.33   Additional Vitals (Critical Care Only)    AWRR:  0 (Device Time: 11:16:00)    Other flowsheet entries    ETCO2 (mmHg):  13 mmHg (Device Time: 11:16:00)     Carmell Austria    11:16:00 Other Flowsheet Documentation Other flowsheet entries    Cardiac Rhythm:  NSR     Carmell Austria    11:19:00 Devices Testing Template Device Data    Pulse:  79 (Device Time: 11:19:00) SpO2:  100 % (Device Time: 11:19:00)   BP:  138/57 (Device Time: 11:19:30) MAP (mmHg):  84   Additional Vitals (Critical Care Only)    AWRR:  9 (Device Time: 11:19:00)    Other flowsheet entries    ETCO2 (mmHg):  25 mmHg (Device Time: 11:19:00)     Carmell Austria    11:19:00 Other Flowsheet Documentation Other flowsheet entries    Cardiac Rhythm:  NSR     Carmell Austria    11:19:23 [place]/Ramsey Mod Sedation [place] Details    ONEOK Pain Scale (for  sedated patients):  0 -  Restful.  No facial expression Sedation Scale (Modified Ramsey):  3 - Awakens easily    Carmell Austria    11:19:29 Pain Assessment Pain Assessment    Pain Assessment (Thermometer Scale):  No Pain (0)     Carmell Austria    11:21:07 Intake/Output sodium chloride 0.9% infusion    Volume (mL):  250 mL     Carmell Austria    11:24:47 [place]/Ramsey Mod Sedation [place] Behavioral Pain Scale (for sedated patients):  0 - Restful.  No facial expression Sedation Scale (Modified Ramsey):  3 - Awakens easily    Carmell Austria    11:24:53 Pain Assessment Pain Assessment    Pain Assessment (Thermometer Scale):  No Pain (0)     Carmell Austria    11:27:00 Devices Testing Template Device Data    Pulse:  78 (Device Time: 11:27:00) SpO2:  100 % (Device Time: 11:27:00)   BP:  116/60 (Device Time: 11:27:30) MAP (mmHg):  78.67   Additional Vitals (Critical Care Only)    AWRR:  0 (Device Time: 11:27:00)    Other flowsheet entries    ETCO2 (mmHg):  15 mmHg (Device Time: 11:27:00)     Carmell Austria    11:27:00 Other Flowsheet Documentation Other flowsheet entries    Cardiac Rhythm:  NSR     Carmell Austria

## 2017-02-21 NOTE — H&P (Signed)
Procedure H & P    HPI:    Briefly, 71 year old presents today through open access for EGD and surveillance colonoscopy.   GERD x 1-1.5 years, better on ranitidine.   Still feels hoarse.  Occasional dysphagia to solids.  No odynophagia.  No chronic abdominal pain.  No change in bowel pattern or stool caliber   No rectal bleeding  No unintentional weight loss  Hx of sessile polyps in the past (OSH)    Past Medical History  Past Medical History:  No date: Back pain  05/13/2015: LLQ pain    Allergies   Review of Patient's Allergies indicates:   Lactose intolerance*    Other (See Comments)    Comment:bloating    Medications    Current Outpatient Prescriptions on File Prior to Encounter:  raNITIdine (ZANTAC) 150 MG tablet Take 1 tablet by mouth 2 (two) times daily Disp: 60 tablet Rfl: 2   DOXYCYCLINE HYCLATE PO Take 100 mg by mouth every 12 (twelve) hours Disp:  Rfl:    citalopram (CELEXA) 20 MG tablet Take 1 tablet by mouth daily Disp: 90 tablet Rfl: 3   fexofenadine (ALLEGRA) 180 MG tablet Take 1 tablet by mouth daily Disp: 90 tablet Rfl: 3   alendronate (FOSAMAX) 70 MG tablet with full glass of water on empty stomach-nothing else by mouth and do not lie down for 1/2 hour Disp: 4 tablet Rfl: 11   polyethylene glycol-electrolytes (NULYTELY WITH FLAVOR PACKS) 420 g solution Take as directed prior to colonoscopy Disp: 1 Bottle Rfl: 0   fluticasone (FLONASE) 50 MCG/ACT nasal spray 1 spray by Each Nostril route daily Disp: 32 mL Rfl: 5   meloxicam (MOBIC) 15 MG tablet Take 1 tablet by mouth daily as needed for Pain Disp: 90 tablet Rfl: 3   [employer] NTS STEP 1 21 MG/24HR PLACE 1 PATCH ONTO THE SKIN DAILY Disp: 28 patch Rfl: 0   LORazepam (ATIVAN) 0.5 MG tablet Take 0.5 mg by mouth nightly as needed Disp:  Rfl: 0     No current facility-administered medications on file prior to encounter.       Social History:  Tobacco -   Alochol -     Family History:  No colon cancer or polyps    ROS:  GEN: NAD  CV: no chest pain,  palpitations  PULM:  no SOB, no OSA  ID: no fever, chills  SKIN: no rashes  NEURO: no weakness, numbness    Physical Examination:  Current VS  BP 127/66  Pulse 88  Temp 97.8 F (36.6 C) (Temporal)  Resp 12  Ht 5' 1.61" (1.565 m)  Wt 56.7 kg (125 lb)  LMP  (LMP Unknown)  SpO2 99%  BMI 23.15 kg/m2    GEN: NAD, comfortable  NECK: supple  Gag Reflex: intact  Airway classification: Class I     A soft palate, fauces, uvula, anterior and posterior tonsil pillars are seen.  ASA: ASA Class II (a patient with mild systemic disease)  Dentures:  No  Mouth: able to open wide  Loose teeth: no  CV: RRR  PULM: CTA b/l  ABD: soft, non-tender  PSYCH: A&O x3  NEURO: Grossly intact      Pertinent Labs:  n/a    Assessment/Plan: Proceed as planned with open access EGD for GERD/dysphagia to solids and surveillance colonoscopy (hx sessile polyps at OSH) with moderate sedation.  These procedures have been fully reviewed with the patient and written informed consent has been obtained.  Follow up determined by procedure findings, please review final report.

## 2017-02-21 NOTE — PROVATION-GI (Signed)
[facility]  Patient Name: Victoria Macdonald  MRN: [id number]  Account Number: [id number]  Gender: Female  Age: 71  Date of Birth: 1945/12/12  Admit Type: Outpatient  Patient Location: [facility]  Note Status: Finalized  Referring MD:         Cathe Mons. D'AGATA, MD  Procedure Date:       02/21/2017 10:48:54 AM  Procedure:            Upper GI endoscopy  Endoscopist:          Adline Mango, MD  Indications for Procedure:       Dysphagia, Follow-up of esophageal reflux  Medications:          Midazolam 3 mg IV, Fentanyl 75 micrograms IV,                         Diphenhydramine 25 mg IV  Procedure:       Just prior to the procedure, an updated history and physical was done. I        obtained an informed consent from the patient reviewing the risk of the        procedure including (but not limited to) respiratory depression, perforation,        bleeding, discomfort, a possible need for surgery and unexpected reactions to        medications. The patient is aware that test has limitations and may not        detect significant lesions such as cancer or other potential diseases. The        patient was also informed that they might need a repeat upper endoscopy        earlier than standard guidelines if there are changes in their symptoms or        concerning findings noted. A time out was performed with the entire procedure        staff present. The scope was passed under direct vision. Throughout the        procedure, the patient's blood pressure, pulse, and oxygen saturations were        monitored continuously. The [id number] was introduced through the        mouth, and advanced to the second part of duodenum. The upper GI endoscopy        was accomplished without difficulty. The patient tolerated the procedure well.  Findings:       The Z-line was irregular and was found 40 cm from the incisors. Biopsies were        taken with a cold forceps for histology.       A small hiatal hernia was present.       No other  significant abnormalities were identified in a careful examination        of the esophagus.       A single, diminutive non-bleeding erosion was found in the gastric antrum.        There were no stigmata of recent bleeding. Biopsies were taken with a cold        forceps for histology.       Diffuse mildly erythematous mucosa without bleeding was found in the gastric        body. Biopsies were taken with a cold forceps for histology.       The cardia and gastric fundus were normal on retroflexion.       No gross lesions were noted in the entire examined duodenum.  Post Procedure Diagnosis:       - Z-line irregular, 40 cm from the incisors. Biopsied.       - Small hiatal hernia.       - Non-bleeding erosive gastropathy. Biopsied.       - Erythematous mucosa in the gastric body. Biopsied.       - No gross lesions in the entire examined duodenum.  Complications:        No immediate complications.  Estimated Blood Loss: None.  Recommendation:       - Await pathology results.       - Telephone my office for pathology results [phone]), if you have not        received a results letter in 4 weeks.       - No miloxicam, ibuprofen, naproxen, or other non-steroidal anti-inflammatory        drugs for 5 days.       - Use Prilosec (omeprazole) 20 mg PO daily for 4 weeks. Faxed to preferred        pharmacy (can use instead of Ranitidine).       - Perform a colonoscopy today, as planned.  Adline Mango, MD  02/21/2017 11:41:36 AM  This report has been signed electronically.  Number of Addenda: 0  Note Initiated On: 02/21/2017 10:48 AM

## 2017-02-21 NOTE — PROVATION-GI (Signed)
[facility]  Patient Name: Victoria Macdonald  MRN: [id number]  Account Number: [id number]  Gender: Female  Age: 71  Date of Birth: June 11, 1946  Admit Type: Outpatient  Patient Location: [facility]  Note Status: Finalized  Referring MD:         Cathe Mons. D'AGATA, MD  Procedure Date:       02/21/2017 10:47:21 AM  Procedure:            Colonoscopy  Endoscopist:          Adline Mango, MD  Indications for Procedure:       High risk colon cancer surveillance: Personal history of colonic polyps  Medications:          See the other procedure note for documentation of the                         administered medications  Procedure:       Just prior to the procedure, an updated history and physical was done. I        obtained an informed consent from the patient reviewing the risk of the        procedure including (but not limited to) respiratory depression, perforation,        bleeding, discomfort, a possible need for surgery and unexpected reactions to        medications. The patient is aware that test has limitations and may not        detect significant lesions such as cancer or other potential diseases. The        patient was also informed that they might need a repeat colonoscopy earlier        than standard guidelines if there are changes in their symptoms or concerning        findings noted. A time out was performed with the entire procedure staff        present. The scope was passed under direct vision. Throughout the procedure,        the patient's blood pressure, pulse, and oxygen saturations were monitored        continuously. The [id number] was introduced through the anus and        advanced to the cecum, identified by appendiceal orifice and ileocecal valve.        The scope was then slowly withdrawn with confirmation of the noted findings.        The colonoscopy was performed without difficulty. The patient tolerated the        procedure well. The quality of the bowel preparation was adequate. Scope         withdrawal time was 8 minutes. The total duration of the procedure was 12        minutes.  Findings:       The perianal and digital rectal examinations were normal.       A 5 mm polyp was found in the transverse colon. The polyp was sessile. The        polyp was removed with a hot snare. Resection and retrieval were complete.       A 3 mm polyp was found in the ascending colon. The polyp was sessile. The        polyp was removed with a jumbo cold forceps. Resection and retrieval were        complete.       Many medium-mouthed diverticula were found in the sigmoid colon, descending  colon and transverse colon.       Internal hemorrhoids were found during retroflexion. The hemorrhoids were        Grade I (internal hemorrhoids that do not prolapse).       No additional abnormalities were found on retroflexion.  Post Procedure Diagnosis:       - One 5 mm polyp in the transverse colon, removed with a hot snare. Resected        and retrieved.       - One 3 mm polyp in the ascending colon, removed with a jumbo cold forceps.        Resected and retrieved.       - Diverticulosis in the sigmoid colon, in the descending colon and in the        transverse colon.       - Internal hemorrhoids.  Complications:        No immediate complications.  Estimated Blood Loss: None.  Recommendation:       - Discharge patient to home.       - No miloxicam, ibuprofen, naproxen, or other non-steroidal anti-inflammatory        drugs for 3 weeks after polyp removal.       - Await pathology results.       - Telephone my office for pathology results [phone]), if you have not        received a results letter in 4 weeks.       - Repeat colonoscopy for surveillance based on pathology results, in 3-5        years.       - Return to my office at appointment to be scheduled.  Adline Mango, MD  02/21/2017 11:46:25 AM  This report has been signed electronically.  Number of Addenda: 0  Note Initiated On: 02/21/2017 10:47 AM

## 2017-02-21 NOTE — Discharge Instructions (Signed)
GI CENTER DISCHARGE INSTRUCTIONS     When you return home, you may feel sleepy. Get plenty of rest for the remainder of the day.     If you received sedation for your procedure DO NOT DRIVE, OPERATE MACHINERY OR MAKE IMPORTANT DECISIONS for the reminder of the day.     It is normal after having a COLONOSCOPY to feel a little gassy and bloated, but if you develop SEVERE ABDOMINAL PAIN call your doctor immediately.     It is normal after having a COLONOSCOPY to see a small amount of blood after your first few bowel movements, but if you see a LARGE AMOUNT OF BRIGHT RED BLOOD, call your doctor immediately.     It is normal after having an UPPER ENDOSCOPY to develop a mild sore throat that will last a few days.     If you had a biopsy or Polypectomy you will need o hold aspirin or medications containing aspirin for  {NUMBERS 1-12:10} day/s.     If you had a biopsy or Polypectomy you will need o hold your Coumadin for {NUMBERS 1-12:10} day/s.     If you had a biopsy or Polypectomy you will need to hold any medication such as Advil, Motrin, Naproxin, Ibuprofen etc. for days.      Call your doctor immediately if you develop:   -  Chest Pain   -  Shortness of breath   -  Difficulty swallowing   -  Vomit bright red blood   -  Notice your bowel movements are black or maroon colored   -  You feel weak and tired     Call your physician for any unusual symptoms.     If for any reason you are unable to reach your doctor go to the nearest         [place].     SPECIFIC INSTRUCTIONS:    ENDOSCOPY:    Post Procedure Diagnosis:       - Z-line irregular, 40 cm from the incisors. Biopsied.       - Small hiatal hernia.       - Non-bleeding erosive gastropathy. Biopsied.       - Erythematous mucosa in the gastric body. Biopsied.       - No gross lesions in the entire examined duodenum.  Complications:        No immediate complications.  Estimated Blood Loss: None.  Recommendation:       - Await pathology results.       -  Telephone my office for pathology results [phone]), if you have not        received a results letter in 4 weeks.       - No miloxicam, ibuprofen, naproxen, or other non-steroidal anti-inflammatory        drugs for 5 days.       - Use Prilosec (omeprazole) 20 mg PO daily for 4 weeks. Faxed to preferred        pharmacy (can use instead of Ranitidine).       - Perform a colonoscopy today, as planned.  Adline Mango, MD  02/21/2017 11:41:36 AM    COLON:   Post Procedure Diagnosis:       - One 5 mm polyp in the transverse colon, removed with a hot snare. Resected        and retrieved.       - One 3 mm polyp in the ascending colon, removed with a  jumbo cold forceps.        Resected and retrieved.       - Diverticulosis in the sigmoid colon, in the descending colon and in the        transverse colon.       - Internal hemorrhoids.  Complications:        No immediate complications.  Estimated Blood Loss: None.  Recommendation:       - Discharge patient to home.       - No miloxicam, ibuprofen, naproxen, or other non-steroidal anti-inflammatory        drugs for 3 weeks after polyp removal.       - Await pathology results.       - Telephone my office for pathology results [phone]), if you have not        received a results letter in 4 weeks.       - Repeat colonoscopy for surveillance based on pathology results, in 3-5        years.       - Return to my office at appointment to be scheduled.  Adline Mango, MD  02/21/2017 11:46:25 AM

## 2017-02-23 LAB — SURGICAL PATH SPECIMEN

## 2017-02-26 ENCOUNTER — Encounter (HOSPITAL_BASED_OUTPATIENT_CLINIC_OR_DEPARTMENT_OTHER): Payer: Self-pay | Admitting: Gastroenterology

## 2017-02-27 ENCOUNTER — Telehealth (HOSPITAL_BASED_OUTPATIENT_CLINIC_OR_DEPARTMENT_OTHER): Payer: Self-pay | Admitting: Family Medicine

## 2017-02-27 NOTE — Telephone Encounter (Signed)
-----   Message from [facility] sent at 02/27/2017 12:18 PM EDT -----  Regarding: surgery questions  Contact: [place] [phone], 71 year old, female    Calls today:  Clinical Questions ([place])    Name of person calling Self   Specific nature of request patient is requesting to speak with nurse regarding concerns she has about surgery. I offered appointment with PA but only wants to see D'Agata  Return phone number [phone]  Person calling on behalf of patient: Patient (self)    CALL BACK NUMBER: [phone]    Patient's language of care: English    Patient does not need an interpreter.    Patient's PCP: Fredirick Lathe, MD

## 2017-02-27 NOTE — Progress Notes (Signed)
Returned call to pt.     She has a lot of confusion about all these upcoming appointments. She sees an ophthalmologist outside [facility] which she was told was fine, (PCP provided referral.) She is having cataract surgery on 7/26.    Received a call about ophthalmology appointment that was set up here on 8/2 and she doesn't know why that was made right after her surgery. She did say she spoke with PCP about switching over all care to [facility] providers and informed pt it is a new pt visit. Provided the phone number for her to call and reschedule if needed.    She also received a call about a GI appointment may 24th and has no idea what this is. Informed her it is a f/u visit and read result letter that was sent to her yesterday.     No further questions at this time.

## 2017-02-28 ENCOUNTER — Ambulatory Visit (HOSPITAL_BASED_OUTPATIENT_CLINIC_OR_DEPARTMENT_OTHER): Payer: Medicare (Managed Care) | Admitting: Medical

## 2017-03-15 ENCOUNTER — Other Ambulatory Visit (HOSPITAL_BASED_OUTPATIENT_CLINIC_OR_DEPARTMENT_OTHER): Payer: Self-pay | Admitting: Physician Assistant

## 2017-03-15 ENCOUNTER — Telehealth (HOSPITAL_BASED_OUTPATIENT_CLINIC_OR_DEPARTMENT_OTHER): Payer: Self-pay | Admitting: Physician Assistant

## 2017-03-15 ENCOUNTER — Ambulatory Visit (HOSPITAL_BASED_OUTPATIENT_CLINIC_OR_DEPARTMENT_OTHER): Payer: Medicare (Managed Care) | Admitting: Gastroenterology

## 2017-03-15 VITALS — BP 141/64 | HR 78 | Temp 97.2°F | Wt 125.0 lb

## 2017-03-15 DIAGNOSIS — A048 Other specified bacterial intestinal infections: Secondary | ICD-10-CM

## 2017-03-15 MED ORDER — BISMUTH SUBSALICYLATE 262 MG PO CHEW: 262 mg | tablet | Freq: Three times a day (TID) | ORAL | 0 refills | 0 days | Status: AC

## 2017-03-15 MED ORDER — CLARITHROMYCIN 500 MG PO TABS
500.00 mg | ORAL_TABLET | Freq: Two times a day (BID) | ORAL | 0 refills | Status: AC
Start: 2017-03-15 — End: 2017-03-25

## 2017-03-15 MED ORDER — TETRACYCLINE HCL 500 MG PO CAPS
500.00 mg | ORAL_CAPSULE | Freq: Three times a day (TID) | ORAL | 0 refills | Status: AC
Start: 2017-03-15 — End: 2017-03-25

## 2017-03-15 MED ORDER — METRONIDAZOLE 500 MG PO TABS: 500 mg | tablet | Freq: Three times a day (TID) | ORAL | 0 refills | 0 days | Status: DC

## 2017-03-15 MED ORDER — BISMUTH SUBSALICYLATE 262 MG PO CHEW
262.00 mg | CHEWABLE_TABLET | Freq: Three times a day (TID) | ORAL | 0 refills | Status: AC
Start: 2017-03-15 — End: 2017-03-25

## 2017-03-15 MED ORDER — AMOXICILLIN 500 MG PO TABS: 1000 mg | tablet | Freq: Two times a day (BID) | ORAL | 0 refills | 0 days | Status: DC

## 2017-03-15 MED ORDER — PANTOPRAZOLE SODIUM 40 MG PO TBEC: 40 mg | tablet | Freq: Two times a day (BID) | ORAL | 0 refills | 0 days | Status: AC

## 2017-03-15 MED ORDER — METRONIDAZOLE 500 MG PO TABS
500.0000 mg | ORAL_TABLET | Freq: Three times a day (TID) | ORAL | 0 refills | Status: DC
Start: 2017-03-15 — End: 2017-03-15

## 2017-03-15 MED ORDER — TETRACYCLINE HCL 500 MG PO CAPS: 500 mg | capsule | Freq: Three times a day (TID) | ORAL | 0 refills | 0 days | Status: AC

## 2017-03-15 MED ORDER — AMOXICILLIN 500 MG PO TABS
1000.0000 mg | ORAL_TABLET | Freq: Two times a day (BID) | ORAL | 0 refills | Status: DC
Start: 2017-03-15 — End: 2017-03-15

## 2017-03-15 MED ORDER — OMEPRAZOLE 20 MG PO CPDR
20.0000 mg | DELAYED_RELEASE_CAPSULE | Freq: Two times a day (BID) | ORAL | 0 refills | Status: DC
Start: 2017-03-15 — End: 2017-03-15

## 2017-03-15 MED ORDER — PANTOPRAZOLE SODIUM 40 MG PO TBEC
40.00 mg | DELAYED_RELEASE_TABLET | Freq: Two times a day (BID) | ORAL | 0 refills | Status: AC
Start: 2017-03-15 — End: 2017-03-25

## 2017-03-15 MED ORDER — OMEPRAZOLE 20 MG PO CPDR: 20 mg | capsule | Freq: Two times a day (BID) | ORAL | 0 refills | 0 days | Status: DC

## 2017-03-15 MED ORDER — CLARITHROMYCIN 500 MG PO TABS: 500 mg | tablet | Freq: Two times a day (BID) | ORAL | 0 refills | 0 days | Status: AC

## 2017-03-15 NOTE — Telephone Encounter (Signed)
-----   Message from [facility] sent at 03/15/2017 12:45 PM EDT -----  Regarding: Linna Caprice PA  Person calling on behalf of patient: prescription from the fax machine.    Medicine Name: Omepraxole  Dosage: 20Mg  ORAL CAPSULE  Frequency (how many pills, how many times a day): TAKE 1 capsule by mouth two times daily  Number of pills left: n/a  Documented patient preferred pharmacies:  No Pharmacies Listed  Pharmacy Name: [facility] Telephone Number: [phone]  Pharmacy  Fax Number: [phone]    [place] NUMBER: [phone]  Cell phone:   Other phone:    Available times:    Patient's language of care: Data Unavailable    Patient does not need an interpreter.

## 2017-03-15 NOTE — Progress Notes (Signed)
Patient understands and agrees with the plan

## 2017-03-15 NOTE — Progress Notes (Signed)
CC: Victoria Macdonald is a 71 year old female who presents for follow up of open access EGD/colonoscopy for heartburn and CRC screening     HPI:   EGD with gastritis and h pylori   Colonoscopy with 2 sessile serrated adenomas   Continuing to have regular heartburn   She does use caffeine and mint daily     Past Medical History  Patient Active Problem List:     Right low back pain     Tobacco use disorder     Sessile colonic polyp     Adjustment disorder with depressed mood     Osteoporosis     GAD (generalized anxiety disorder)     Gastroesophageal reflux disease without esophagitis     Lung nodules      Past Surgical History  Past Surgical History:  ~1985: ABDOMINAL SURGERY  ~1975: TUBAL LIGATION    Review of Patient's Allergies indicates:   Lactose intolerance*    Other (See Comments)    Comment:bloating    Social Hx  Smoking status: Current Every Day Smoker                                                   Packs/day: 0.25      Years: 53.00     Smokeless tobacco: Never Used                      Alcohol use: No                Family Hx    Family History    Cancer - Other Sister     Comment: Uterus, ovarian, intestinal, liver    Glaucoma Brother     OTHER Sister     Comment: Dementia           Current Outpatient Prescriptions:  amoxicillin (AMOXIL) 500 MG tablet Take 2 tablets by mouth 2 (two) times daily Disp: 40 tablet Rfl: 0   clarithromycin (BIAXIN) 500 MG tablet Take 1 tablet by mouth 2 (two) times daily Disp: 20 tablet Rfl: 0   metroNIDAZOLE (FLAGYL) 500 MG tablet Take 1 tablet by mouth 3 (three) times daily Disp: 30 tablet Rfl: 0   omeprazole (PRILOSEC) 20 MG capsule Take 1 capsule by mouth 2 (two) times daily Disp: 20 capsule Rfl: 0   polyethylene glycol-electrolytes (NULYTELY WITH FLAVOR PACKS) 420 g solution Take as directed prior to colonoscopy Disp: 1 Bottle Rfl: 0   raNITIdine (ZANTAC) 150 MG tablet Take 1 tablet by mouth 2 (two) times daily Disp: 60 tablet Rfl: 2   fluticasone (FLONASE) 50 MCG/ACT  nasal spray 1 spray by Each Nostril route daily Disp: 32 mL Rfl: 5   DOXYCYCLINE HYCLATE PO Take 100 mg by mouth every 12 (twelve) hours Disp:  Rfl:    citalopram (CELEXA) 20 MG tablet Take 1 tablet by mouth daily Disp: 90 tablet Rfl: 3   meloxicam (MOBIC) 15 MG tablet Take 1 tablet by mouth daily as needed for Pain Disp: 90 tablet Rfl: 3   fexofenadine (ALLEGRA) 180 MG tablet Take 1 tablet by mouth daily Disp: 90 tablet Rfl: 3   [facility] NTS STEP 1 21 MG/24HR PLACE 1 PATCH ONTO THE SKIN DAILY Disp: 28 patch Rfl: 0   LORazepam (ATIVAN) 0.5 MG tablet Take 0.5 mg by  mouth nightly as needed Disp:  Rfl: 0     No current facility-administered medications for this visit.     REVIEW OF SYSTEMS:    See HPI. All other systems reviewed and negative.        Physical Exam:  Vital Signs: BP 141/64 (Site: RA, Position: Sitting, Cuff Size: Reg)  Pulse 78  Temp 97.2 F (36.2 C) (Temporal)  Wt 56.7 kg (125 lb)  LMP  (LMP Unknown)  SpO2 96%  BMI 23.15 kg/m2 Body mass index is 23.15 kg/m.  General: No acute distress.   HEENT: Normocephalic.  No scleral icterus.   Abdominal: Non-tender.  Ext: no edema.  Neurological: Normal gait  Psych: Alert and oriented x 3, appropriate.  Skin: No rashes in exposed regions.  No jaundice.    Assessment/Plan: Victoria Macdonald is a 71 year old female who presents for follow up of open access EGD/colonoscopy for heartburn and CRC screening.   - discussed results in detail   - given Rx for h pylori (she will ask pharmacist about possible DDI with citalopram and omperazole)   - do stool antigen 3 weeks after finishing treatment   - f/u in fall  - repeat colonoscopy in 3 years     I have spent 25 minutes in face to face time with this patient/patient proxy of which > 50% was in counseling or coordination of care regarding above issues/Dx.                  Patient understands and agrees with the plan

## 2017-03-15 NOTE — Progress Notes (Signed)
PT. States she is safe at home.

## 2017-03-15 NOTE — Progress Notes (Unsigned)
Prior authorization request for Omeprazole 20mg     Patient has Medicare Part D for prescription coverage.  I.D.# [id number]  Phone number: [phone]

## 2017-03-15 NOTE — Progress Notes (Unsigned)
Patient's insurance covers 1 cap / day. Please considering changing to OMEPRAZOLE 40MG  daily    If change is not appropriate, please provide clinical rationale for omeprazole 20mg  twice aday to central refill pool.

## 2017-03-20 ENCOUNTER — Telehealth (HOSPITAL_BASED_OUTPATIENT_CLINIC_OR_DEPARTMENT_OTHER): Payer: Self-pay | Admitting: Family Medicine

## 2017-03-20 NOTE — Progress Notes (Signed)
Returned call to patient. Called number 3 times and immediately rang busy. Will try call later

## 2017-03-20 NOTE — Telephone Encounter (Signed)
-----   Message from Stevenson Clinch sent at 03/20/2017  4:12 PM EDT -----  Regarding: surgery paperwork questions   Contact: [place] [phone], 71 year old, female    Calls today:  Clinical Questions ([place])    Name of person calling pt   Specific nature of request Would like to speak to RN in regards to surgery ([facility]) paperwork thank you  Return phone number [phone]  Person calling on behalf of patient: Patient (self)      Patient's language of care: English    Patient does not need an interpreter.    Patient's PCP: Fredirick Lathe, MD

## 2017-03-21 ENCOUNTER — Telehealth (HOSPITAL_BASED_OUTPATIENT_CLINIC_OR_DEPARTMENT_OTHER): Payer: Self-pay | Admitting: Family Medicine

## 2017-03-21 ENCOUNTER — Telehealth (HOSPITAL_BASED_OUTPATIENT_CLINIC_OR_DEPARTMENT_OTHER): Payer: Self-pay

## 2017-03-21 NOTE — Progress Notes (Unsigned)
I tried returning the call to [facility] answer, L/M that I will forward her message to Dr. Marissa Calamity.

## 2017-03-21 NOTE — Progress Notes (Signed)
Saw paperwork. Faxed on 5/24. Requesting preop physical, EKG and labs  Called patient. Will not be completed before tomorrow  Pt was unaware that she needed pre-op    She will call and rescheduled surgery and ride    I will book pt for preop on 6/5 at 3:20PM with me  Pt will call back if surgery scheduled > 30 days from 6/5

## 2017-03-21 NOTE — Telephone Encounter (Signed)
-----   Message from Stevenson Clinch sent at 03/21/2017  4:00 PM EDT -----  Regarding: most recent colonoscopy results   Contact: [place] [phone], 71 year old, female    Calls today:  Clinical Questions ([place])    [facility]   Specific nature of request would like to get colonoscopy result from April fax [phone]  Return phone number [phone]  Person calling on behalf of patient: Martine :    Patient's language of care: English    Patient does not need an interpreter.    Patient's PCP: Fredirick Lathe, MD

## 2017-03-21 NOTE — Progress Notes (Signed)
Returned call to pt. She is really worried because she got a call from [facility] stating that they will need to cancel her surgery tomorrow if they do not receive the paperwork back from her PCP's office. They told her they sent forms to be completed in early May.     In review of chart, I don't see note of paperwork received. Asked her to have them refax and provided phone number. Routing to PCP to be on the lookout for it. She will call them now and have them refax it.     Pt is very anxious and asks that Dr. Marissa Calamity please please please fax it back today because she needs to have this procedure done and can't wait any longer.

## 2017-03-21 NOTE — Telephone Encounter (Signed)
-----   Message from Valentina Gu sent at 03/21/2017 11:21 AM EDT -----  Regarding: Paperwork for Surgery tomorrow   Contact: [phone]  Calls today:  Clinical Questions ([place])    Name of person calling pt   Specific nature of request Would like to speak to nurse in regards to surgery ([facility]) paperwork not filled out by doctor for surgery tomorrow. thank you  Return phone number [phone]  Person calling on behalf of patient: Patient (self)      Patient's language of care: English    Patient does not need an interpreter.    Patient's PCP: Fredirick Lathe, MD

## 2017-03-21 NOTE — Progress Notes (Signed)
Called and spoke with Alan Ripper from the [facility].     Relayed message to her that pt has not had her preop appointment yet and Dr. Marissa Calamity spoke with her about an hour ago to let her know that paperwork can not be completed today. Informed Martine of preop appointment date and that pt should be calling her to have surgery rescheduled.

## 2017-03-21 NOTE — Progress Notes (Signed)
Called Victoria Macdonald per pt request. Left message on machine for call back.

## 2017-03-21 NOTE — Telephone Encounter (Signed)
-----   Message from Stevenson Clinch sent at 03/21/2017 11:31 AM EDT -----  Regarding: Returning call   Contact: [place] [phone], 71 year old, female    Calls today:  Clinical Questions ([place])    Name of person calling pt   Specific nature of request Returning call please call pt back   Return phone number [phone]  Person calling on behalf of patient: Patient (self)    Patient's language of care: English    Patient does not need an interpreter.    Patient's PCP: Fredirick Lathe, MD

## 2017-03-21 NOTE — Progress Notes (Signed)
Appt scheduled

## 2017-03-21 NOTE — Telephone Encounter (Signed)
-----   Message from Rhae Lerner sent at 03/21/2017  1:48 PM EDT -----  Regarding: Notes  Contact: [place] [phone], 71 year old, female    Calls today:  Clinical Questions ([place])    Name of person calling Verdis Frederickson  Specific nature of request pt want nurse to call surgery coordinator [facility]ipper [phone] [facility] to [facility] notes.Fax number [phone].Need notes before 3:00 pm tomorrow.  Return phone number [phone]  Person calling on behalf of patient: patient    Patient's language of care: English    Patient does not need an interpreter.    Patient's PCP: Fredirick Lathe, MD

## 2017-03-22 ENCOUNTER — Other Ambulatory Visit (HOSPITAL_BASED_OUTPATIENT_CLINIC_OR_DEPARTMENT_OTHER): Payer: Self-pay | Admitting: Gastroenterology

## 2017-03-22 NOTE — Progress Notes (Signed)
PER Pharmacy, Victoria Macdonald is a 71 year old female has requested a refill of omeprazole.      Last Office Visit: 03/15/2017 with Adline Mango  Last Physical Exam: 05/02/2016      Other Med Adult:  Most Recent BP Reading(s)  03/15/17 : 141/64          Cholesterol (mg/dL)   Date Value   03/04/2015 235   ----------    LOW DENSITY LIPOPROTEIN DIRECT (mg/dL)   Date Value   03/04/2015 171   ----------    HIGH DENSITY LIPOPROTEIN (mg/dL)   Date Value   03/04/2015 45   ----------  No results found for: TG      No results found for: TSHSC      No results found for: TSH    No results found for: HGBA1C    No results found for: POCA1C      No results found for: INR      SODIUM (mmol/L)   Date Value   05/18/2016 142   ----------      POTASSIUM (mmol/L)   Date Value   05/18/2016 4.4   ----------          CREATININE (mg/dL)   Date Value   05/18/2016 0.8   ----------    Documented patient preferred pharmacies:    [id number] [place] [place] [facility] [address]  Phone: [phone] Fax: [phone]

## 2017-03-23 ENCOUNTER — Ambulatory Visit (HOSPITAL_BASED_OUTPATIENT_CLINIC_OR_DEPARTMENT_OTHER): Payer: Self-pay | Admitting: Thoracic Surgery (Cardiothoracic Vascular Surgery)

## 2017-03-23 DIAGNOSIS — Z961 Presence of intraocular lens: Secondary | ICD-10-CM | POA: Insufficient documentation

## 2017-03-23 DIAGNOSIS — R918 Other nonspecific abnormal finding of lung field: Secondary | ICD-10-CM

## 2017-03-23 HISTORY — PX: CATARACT EXTRACTION, BILATERAL: SHX1313

## 2017-03-23 HISTORY — PX: PB - POST-CATARACT LASER SURGERY: PB-66821

## 2017-03-26 NOTE — Progress Notes (Signed)
IAdline Mango, personally interviewed, examined and reviewed the chart of this patient and formulated the plan.  Patient was seen together with Linna Caprice, please see her note from today's visit for the complete evaluation.  Due to interactions with other medications, h.pylori regimen had to be adjusted to tetracycline/metronidazole/bismuth and pantoprazole.    All questions answered.  Pt understands and agrees with plan.

## 2017-03-27 ENCOUNTER — Ambulatory Visit (HOSPITAL_BASED_OUTPATIENT_CLINIC_OR_DEPARTMENT_OTHER): Payer: Medicare (Managed Care) | Admitting: Family Medicine

## 2017-03-30 ENCOUNTER — Ambulatory Visit (HOSPITAL_BASED_OUTPATIENT_CLINIC_OR_DEPARTMENT_OTHER): Payer: Medicare (Managed Care) | Admitting: Thoracic Surgery (Cardiothoracic Vascular Surgery)

## 2017-04-06 ENCOUNTER — Other Ambulatory Visit (HOSPITAL_BASED_OUTPATIENT_CLINIC_OR_DEPARTMENT_OTHER): Payer: Self-pay | Admitting: Family Medicine

## 2017-04-06 DIAGNOSIS — K219 Gastro-esophageal reflux disease without esophagitis: Secondary | ICD-10-CM

## 2017-04-06 NOTE — Progress Notes (Signed)
PER Pharmacy, Victoria Macdonald is a 71 year old female has requested a refill of ranitidine.      Last Office Visit: 02/08/2017 with D'Agata, C  Last Physical Exam: 05/02/2016      Other Med Adult:  Most Recent BP Reading(s)  03/15/17 : 141/64          Cholesterol (mg/dL)   Date Value   03/04/2015 235   ----------    LOW DENSITY LIPOPROTEIN DIRECT (mg/dL)   Date Value   03/04/2015 171   ----------    HIGH DENSITY LIPOPROTEIN (mg/dL)   Date Value   03/04/2015 45   ----------  No results found for: TG      No results found for: TSHSC      No results found for: TSH    No results found for: HGBA1C    No results found for: POCA1C      No results found for: INR      SODIUM (mmol/L)   Date Value   05/18/2016 142   ----------      POTASSIUM (mmol/L)   Date Value   05/18/2016 4.4   ----------          CREATININE (mg/dL)   Date Value   05/18/2016 0.8   ----------    Documented patient preferred pharmacies:    [id number] [place] [place] [address] [place]  Phone: [phone] Fax: [phone]

## 2017-04-09 ENCOUNTER — Telehealth (HOSPITAL_BASED_OUTPATIENT_CLINIC_OR_DEPARTMENT_OTHER): Payer: Self-pay | Admitting: Internal Medicine

## 2017-04-09 ENCOUNTER — Ambulatory Visit (HOSPITAL_BASED_OUTPATIENT_CLINIC_OR_DEPARTMENT_OTHER): Payer: Medicare (Managed Care) | Admitting: Family Medicine

## 2017-04-09 VITALS — BP 116/60 | HR 88 | Temp 97.4°F

## 2017-04-09 DIAGNOSIS — R918 Other nonspecific abnormal finding of lung field: Secondary | ICD-10-CM

## 2017-04-09 DIAGNOSIS — F17200 Nicotine dependence, unspecified, uncomplicated: Secondary | ICD-10-CM

## 2017-04-09 NOTE — Progress Notes (Signed)
Betsy from [facility] called the Central Refill Department to complete a benefit analysis for the TDAP and PCV-13 Vaccine.    The vaccine is covered under the patients prescription coverage.    Please choose [id number] for TDAP and 90670.2 for PCV-13 (Prior Auth/Pharmacy)

## 2017-04-09 NOTE — Progress Notes (Signed)
Clinic Note:    SUBJECTIVE:  Victoria Macdonald is a 71 year old female with the following active problem list:    Patient Active Problem List:     Right low back pain     Tobacco use disorder     Sessile colonic polyp     Adjustment disorder with depressed mood     Osteoporosis     GAD (generalized anxiety disorder)     Gastroesophageal reflux disease without esophagitis     Lung nodules      CC: smoking    Surgery is done, everything went fine.  Both eyes cataracts. Vision already feels better. Appt for stitches out July 6.     Knows she needs CT lung cancer screen again. Has the info, will reschedule.  Has been smoking for over 99yrs. Depends on day, smokes more if sad/nervous. 5-8 cigarettes.   Patch didn't work  Not interested in chantix/wellbutrin    No cough, fever.   Lost 10lbs over a year - changed diet for reflux, feels much better now.     Only med is ranitidine    ROS  Reviewed as per HPI above. All other systems reviewed and negative.    Past Medical History:  No date: Back pain  05/13/2015: LLQ pain    Past Surgical History:  ~1985: ABDOMINAL SURGERY  ~1975: TUBAL LIGATION      Current Outpatient Prescriptions on File Prior to Visit:  omeprazole (PRILOSEC) 20 MG capsule Take 1 capsule by mouth daily before breakfast Disp: 30 capsule Rfl: 0   polyethylene glycol-electrolytes (NULYTELY WITH FLAVOR PACKS) 420 g solution Take as directed prior to colonoscopy Disp: 1 Bottle Rfl: 0   raNITIdine (ZANTAC) 150 MG tablet Take 1 tablet by mouth 2 (two) times daily Disp: 60 tablet Rfl: 2   fluticasone (FLONASE) 50 MCG/ACT nasal spray 1 spray by Each Nostril route daily Disp: 32 mL Rfl: 5   DOXYCYCLINE HYCLATE PO Take 100 mg by mouth every 12 (twelve) hours Disp:  Rfl:    citalopram (CELEXA) 20 MG tablet Take 1 tablet by mouth daily Disp: 90 tablet Rfl: 3   meloxicam (MOBIC) 15 MG tablet Take 1 tablet by mouth daily as needed for Pain Disp: 90 tablet Rfl: 3   fexofenadine (ALLEGRA) 180 MG tablet Take 1 tablet by  mouth daily Disp: 90 tablet Rfl: 3   [facility] NTS STEP 1 21 MG/24HR PLACE 1 PATCH ONTO THE SKIN DAILY Disp: 28 patch Rfl: 0   LORazepam (ATIVAN) 0.5 MG tablet Take 0.5 mg by mouth nightly as needed Disp:  Rfl: 0     No current facility-administered medications on file prior to visit.     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[employer], helps others with translation    Diet- changed because of heartburn- yogurt, potatoes, broccoli    Exercise- walking a little     Wears her seatbelt    Denies safety concerns    Faith  is important to her        OBJECTIVE:  BP 116/60 (Site: RA, Position: Sitting, Cuff Size: Reg)  Pulse 88  Temp 97.4 F (36.3 C) (Temporal)  LMP  (LMP Unknown)  SpO2 98%     Most Recent BP Reading(s)  04/09/17 : 116/60  03/15/17 : 141/64  02/21/17 : 104/56      Most Recent Weight Reading(s)  03/15/17 : 56.7 kg (125 lb)  02/21/17 : 56.7 kg (125 lb)  02/08/17 : 56.7 kg (125 lb)  05/02/16 : 60.8 kg (134 lb)  04/04/16 : 60.8 kg (134 lb)       Gen: comfortable, no distress  HEENT: conjunctivae pink, moist mucous membranes, oropharynx clear  CV: regular rate and rhythm, no murmurs  Resp: clear to auscultation bilaterally  Ext: no edema, erythema or rash  Neuro: alert and oriented, cranial nerves grossly intact  Psych: normal rate and content of speech, mood "good", affect congruent with mood. Organized and linear thought process,  No signs of psychosis.    ASSESSMENT AND PLAN:  1. Tobacco use disorder  Smoked for over 46yrs. Didn't find patch or gum helpful and declines chantix/wellbutrin today. Aware of importance  -motivational interviewing done  -contemplative,  continue to support at each visit    2. Lung nodules  >68yr smoker, aware of recommendation for repeat CT to monitor and has the information, she will schedule this. No B symptoms of malignancy currently and normal lung exam.      # HCM  - up to date    Follow up in 2 months  Future Appointments  Date Time Provider [place]   05/24/2017 1:30 PM [place]       1. The patient indicates understanding of these issues and agrees with the plan.   2. The patient is given an After Visit Summary sheet that lists all of their medications with directions, their allergies, orders placed during this encounter, immunization dates, and follow- up instructions.   3. I reviewed the patient's medical information including medical history and allergies  4. I reconciled the patient's medication list and supplied needed refills.    Alfonso Patten. Kalman Shan, MD        Notes for next visit:

## 2017-04-09 NOTE — Telephone Encounter (Signed)
-----   Message from [place]. Lemus sent at 04/09/2017  9:34 AM EDT -----  Regarding: benefit review  Vaccine Benefit Analysis:  Needed for Tdap and PCV-13 vaccine(s).  Is patient currently waiting in clinic? Yes    Route to the nursing pool?  Yes

## 2017-04-10 NOTE — Progress Notes (Signed)
Preceptor Note  I personally reviewed this case, along with the patient's history and exam findings, with Dr. Kalman Shan. I confirm the findings, and agree with the assessment and plan, as documented in the visit note.    Dshaun Reppucci MD

## 2017-04-11 ENCOUNTER — Other Ambulatory Visit (HOSPITAL_BASED_OUTPATIENT_CLINIC_OR_DEPARTMENT_OTHER): Payer: Self-pay

## 2017-04-23 ENCOUNTER — Other Ambulatory Visit (HOSPITAL_BASED_OUTPATIENT_CLINIC_OR_DEPARTMENT_OTHER): Payer: Self-pay | Admitting: Gastroenterology

## 2017-04-23 NOTE — Progress Notes (Signed)
PER Pharmacy, Victoria Macdonald is a 71 year old female has requested a refill of omeprazole 20mg .      Last Office Visit: 03/15/17 with Sabra Heck  Last Physical Exam: 05/02/16      Other Med Adult:  Most Recent BP Reading(s)  04/09/17 : 116/60          Cholesterol (mg/dL)   Date Value   03/04/2015 235   ----------    LOW DENSITY LIPOPROTEIN DIRECT (mg/dL)   Date Value   03/04/2015 171   ----------    HIGH DENSITY LIPOPROTEIN (mg/dL)   Date Value   03/04/2015 45   ----------  No results found for: TG      No results found for: TSHSC      No results found for: TSH    No results found for: HGBA1C    No results found for: POCA1C      No results found for: INR      SODIUM (mmol/L)   Date Value   05/18/2016 142   ----------      POTASSIUM (mmol/L)   Date Value   05/18/2016 4.4   ----------          CREATININE (mg/dL)   Date Value   05/18/2016 0.8   ----------    Documented patient preferred pharmacies:    [id number] [place] [place] [address]  Phone: [phone] Fax: [phone]

## 2017-05-17 ENCOUNTER — Ambulatory Visit (HOSPITAL_BASED_OUTPATIENT_CLINIC_OR_DEPARTMENT_OTHER): Payer: Self-pay | Admitting: Family Medicine

## 2017-05-17 NOTE — Telephone Encounter (Signed)
Regarding: arm pain   ----- Message from Elijah Birk sent at 05/17/2017 11:13 AM EDT -----  Victoria Macdonald [id number], 71 year old, female    Calls today:  Sick    What are the symptoms arm pain  How long has patient been sick? 2-3 weeks  What has pt. tried at home n/a  Person calling on behalf of patient: Patient (self)    Patient's language of care: English    Patient does not need an interpreter.    Patient's PCP: Fredirick Lathe, MD

## 2017-05-17 NOTE — Telephone Encounter (Signed)
Returned call and spoke w pt.    About 5 yrs ago had terrible shoulder pain, saw a dr in [place] who gave her joint injections. Helped a lot. Not sure what the cause ? Arthritis v Bursitis possibly.    The pain returned about 2 weeks ago.  Pain only when she moves in her arm and shoulder.  At it's worst pain = 9/10.  No recent injury or trauma.    Offered appt for tomorrow in clinic but unable to come in. Earliest date she's avail is 05/23/17.    Appt scheduled for 05/23/17.  Advised may try Tylenol or ibuprofen, heat or ice until seen.    Message to provider.

## 2017-05-23 ENCOUNTER — Encounter (HOSPITAL_BASED_OUTPATIENT_CLINIC_OR_DEPARTMENT_OTHER): Payer: Self-pay | Admitting: Medical

## 2017-05-23 ENCOUNTER — Ambulatory Visit (HOSPITAL_BASED_OUTPATIENT_CLINIC_OR_DEPARTMENT_OTHER): Payer: Medicare (Managed Care) | Admitting: Medical

## 2017-05-23 VITALS — BP 129/72 | HR 88 | Temp 97.2°F | Wt 130.0 lb

## 2017-05-23 DIAGNOSIS — F411 Generalized anxiety disorder: Secondary | ICD-10-CM

## 2017-05-23 DIAGNOSIS — R918 Other nonspecific abnormal finding of lung field: Secondary | ICD-10-CM

## 2017-05-23 DIAGNOSIS — F4321 Adjustment disorder with depressed mood: Secondary | ICD-10-CM

## 2017-05-23 MED ORDER — SERTRALINE HCL 25 MG PO TABS: 25 mg | tablet | Freq: Every day | ORAL | 0 refills | 0 days | Status: DC

## 2017-05-23 MED ORDER — SERTRALINE HCL 25 MG PO TABS
25.0000 mg | ORAL_TABLET | Freq: Every day | ORAL | 0 refills | Status: DC
Start: 2017-05-23 — End: 2017-05-30

## 2017-05-23 NOTE — Progress Notes (Signed)
[facility]  Office Visit Note   Subjective:   CC: Victoria Macdonald is a 71 year old female patient who presents today for med concern     #shoulder better with acupuncture     #med concern  - stopped citalopram for eye surgery - GERD went away!  - but then started feeling anxious again and had headache so took it again  - then had reflux again   - sees dr Loleta Chance for GERD  - tryin dec smoking    CT scan due - will reschedule     ROS:  Per HPI   Objective:   BP 129/72  Pulse 88  Temp 97.2 F (36.2 C) (Temporal)  Wt 59 kg (130 lb)  LMP  (LMP Unknown)  SpO2 97%  BMI 24.08 kg/m2  Pain Score: Data Unavailable    Physical Exam:  General: alert, appears stated age and cooperative  Eyes: EOMI, Conjunctiva Clear  Cardio: S1, S2 normal, regular rate and rhythm, no murmurs, rubs or gallops, no LE edema  Pulmonary: clear to auscultation and no wheezing, rhonchi, crackles  Skin: skin color, texture, turgor are normal, No rashes or lesions  Psych: interactive and Normal Mood   Neuro: Alert and oriented X 3. Strength grossly intact. Normal coordination and gait.    Assessment & Plan:   1. Adjustment disorder with depressed mood  2. GAD (generalized anxiety disorder)  71 y/o F with h/o anxiety and GERD, she stopped her celexa prior to eye surgery and found her GERD improved a lot, however her anxiety worsened. She desires to try a different SSRI. Will switch from celexa 20 mg to zoloft 25 mg, if no SE, will consider increasing dose.   - Call if SE with zoloft  - sertraline (ZOLOFT) 25 MG tablet; Take 1 tablet by mouth daily  Dispense: 30 tablet; Refill: 0    3. Lung nodules  Due for CT for f/u nodules. Scheduled at the [facility] before leaving today.     HM: discussed CT scan     We discussed the patients current medications. The patient expressed understanding and no barriers to adherence were identified.  The patient was given an AVS  Gwenlyn Found, PA-C 05/23/2017

## 2017-05-24 ENCOUNTER — Ambulatory Visit (HOSPITAL_BASED_OUTPATIENT_CLINIC_OR_DEPARTMENT_OTHER): Payer: Medicare (Managed Care) | Admitting: Ophthalmology

## 2017-05-29 ENCOUNTER — Encounter (HOSPITAL_BASED_OUTPATIENT_CLINIC_OR_DEPARTMENT_OTHER): Payer: Self-pay | Admitting: Family Medicine

## 2017-05-30 ENCOUNTER — Other Ambulatory Visit (HOSPITAL_BASED_OUTPATIENT_CLINIC_OR_DEPARTMENT_OTHER): Payer: Self-pay | Admitting: Family Medicine

## 2017-05-30 DIAGNOSIS — K219 Gastro-esophageal reflux disease without esophagitis: Secondary | ICD-10-CM

## 2017-05-30 DIAGNOSIS — F411 Generalized anxiety disorder: Secondary | ICD-10-CM

## 2017-05-30 DIAGNOSIS — F4321 Adjustment disorder with depressed mood: Secondary | ICD-10-CM

## 2017-05-30 DIAGNOSIS — G8929 Other chronic pain: Secondary | ICD-10-CM

## 2017-05-30 DIAGNOSIS — M5442 Lumbago with sciatica, left side: Secondary | ICD-10-CM

## 2017-05-30 MED ORDER — FLUTICASONE PROPIONATE 50 MCG/ACT NA SUSP
1.0000 | Freq: Every day | NASAL | 5 refills | Status: DC
Start: 2017-05-30 — End: 2017-12-20

## 2017-05-30 MED ORDER — OMEPRAZOLE 20 MG PO CPDR
20.0000 mg | DELAYED_RELEASE_CAPSULE | Freq: Every day | ORAL | 3 refills | Status: DC
Start: 2017-05-30 — End: 2018-04-30

## 2017-05-30 MED ORDER — FEXOFENADINE HCL 180 MG PO TABS
180.0000 mg | ORAL_TABLET | Freq: Every day | ORAL | 3 refills | Status: DC
Start: 2017-05-30 — End: 2017-07-06

## 2017-05-30 MED ORDER — FLUTICASONE PROPIONATE 50 MCG/ACT NA SUSP: 1 | mL | Freq: Every day | NASAL | 5 refills | 0 days | Status: DC

## 2017-05-30 MED ORDER — SERTRALINE HCL 25 MG PO TABS: 25 mg | tablet | Freq: Every day | ORAL | 0 refills | 0 days | Status: DC

## 2017-05-30 MED ORDER — MELOXICAM 15 MG PO TABS
15.0000 mg | ORAL_TABLET | Freq: Every day | ORAL | 3 refills | Status: DC | PRN
Start: 2017-05-30 — End: 2017-11-08

## 2017-05-30 MED ORDER — MELOXICAM 15 MG PO TABS: 15 mg | tablet | Freq: Every day | ORAL | 3 refills | 0 days | Status: DC | PRN

## 2017-05-30 MED ORDER — RANITIDINE HCL 150 MG PO TABS
150.0000 mg | ORAL_TABLET | Freq: Two times a day (BID) | ORAL | 3 refills | Status: DC
Start: 2017-05-30 — End: 2017-09-10

## 2017-05-30 MED ORDER — RANITIDINE HCL 150 MG PO TABS: 150 mg | tablet | Freq: Two times a day (BID) | ORAL | 3 refills | 0 days | Status: DC

## 2017-05-30 MED ORDER — SERTRALINE HCL 25 MG PO TABS
25.0000 mg | ORAL_TABLET | Freq: Every day | ORAL | 0 refills | Status: DC
Start: 2017-05-30 — End: 2017-09-27

## 2017-05-30 MED ORDER — FEXOFENADINE HCL 180 MG PO TABS: 180 mg | tablet | Freq: Every day | ORAL | 3 refills | 0 days | Status: DC

## 2017-05-30 MED ORDER — OMEPRAZOLE 20 MG PO CPDR: 20 mg | capsule | Freq: Every day | ORAL | 3 refills | 0 days | Status: DC

## 2017-05-30 NOTE — Progress Notes (Signed)
PER Pharmacy,Victoria Macdonald is a 71 year old female has requested a refill ofomeprazole ranitidne flonase meloxicam and citalopram .  ?  Last Office Visit: 05/23/2017 with durkee, amber   Last Physical Exam: 05/02/2016  ?  Other Med Adult:  Most Recent BP Reading(s)  05/23/17 : 129/72          Cholesterol (mg/dL)   Date Value   03/04/2015 235   ----------    LOW DENSITY LIPOPROTEIN DIRECT (mg/dL)   Date Value   03/04/2015 171   ----------    HIGH DENSITY LIPOPROTEIN (mg/dL)   Date Value   03/04/2015 45   ----------  No results found for: TG      No results found for: TSHSC      No results found for: TSH    No results found for: HGBA1C    No results found for: POCA1C      No results found for: INR      SODIUM (mmol/L)   Date Value   05/18/2016 142   ----------      POTASSIUM (mmol/L)   Date Value   05/18/2016 4.4   ----------          CREATININE (mg/dL)   Date Value   05/18/2016 0.8   ----------  Documented patient preferred pharmacies:      [place], [place]  Phone: [phone] Fax: [phone]

## 2017-05-30 NOTE — Progress Notes (Signed)
Med refill requested for new delivery pharmacy  Pt is not taking citalopram so removed from medication list  Refilled setraline at low dose since she has only taken it once  Also on allegra daily  Pt to make CPEX appt this fall. She is aware

## 2017-05-30 NOTE — Progress Notes (Signed)
[facility]    Person calling on behalf of patient: Pharmacy    May list multiple medications in this section    Medicine Name: Omeprazole Dr Aviva Kluver 20 MG  90 Supply   Ranitdine Tabs 150MG  90 day supply   Fluticasone PR NS SP 16GM RX 81mcg  90 day supply   Fexofenadine HCL TABS-OTC 180MG  90 day supply   Citalopram HBR TABS 20MG   90 day supply   MELOXICAM TABS 15MG  90 day supply         Documented patient preferred pharmacies:     [place], [place]  Phone: [phone] Fax: [phone]        Patient's language of care: [place]

## 2017-06-06 LAB — CT LUNG NODULE FU WO CONTRAST

## 2017-06-06 NOTE — Addendum Note (Signed)
Addended by: Charolett Bumpers on: 06/06/2017 11:47 AM     Modules accepted: Orders

## 2017-06-14 ENCOUNTER — Other Ambulatory Visit (HOSPITAL_BASED_OUTPATIENT_CLINIC_OR_DEPARTMENT_OTHER): Payer: Self-pay

## 2017-06-14 DIAGNOSIS — A048 Other specified bacterial intestinal infections: Secondary | ICD-10-CM

## 2017-06-14 NOTE — Progress Notes (Unsigned)
Received pharmacy request for amoxicillin which was previous prescribed in triple cocktail for H pylori.  Unclear reason for request or if patient is looking for a different medication

## 2017-06-14 NOTE — Progress Notes (Unsigned)
[facility]    Person calling on behalf of patient: {who is calling:[phone]}    May list multiple medications in this section    Medicine Name: amoxicillin (AMOXIL) 500 MG tablet   Documented patient preferred pharmacies:   [id number] [place], [place]  Phone: [phone] Fax: [phone]    Patient's language of care: [place]

## 2017-06-14 NOTE — Progress Notes (Unsigned)
PER Pharmacy,Victoria Macdonald is a 71 year old female has requested a refill ofAMOXICILLIN  ?Please note that AMOX IS DISCONTINUED IN EPIC ON 03/15/17    Last Office Visit: 05/23/2017 with durkee, amber   Last Physical Exam: 05/02/2016  ?  Other Med Adult:  Most Recent BP Reading(s)  05/23/17 : 129/72              Cholesterol (mg/dL)   Date Value   03/04/2015 235   ----------        LOW DENSITY LIPOPROTEIN DIRECT (mg/dL)   Date Value   03/04/2015 171   ----------        HIGH DENSITY LIPOPROTEIN (mg/dL)   Date Value   03/04/2015 45   ----------  No results found for: TG      No results found for: TSHSC      No results found for: TSH    No results found for: HGBA1C    No results found for: POCA1C      No results found for: INR          SODIUM (mmol/L)   Date Value   05/18/2016 142   ----------          POTASSIUM (mmol/L)   Date Value   05/18/2016 4.4   ----------              CREATININE (mg/dL)   Date Value   05/18/2016 0.8   ----------  Documented patient preferred pharmacies:      [place], [place]  Phone: [phone] Fax: [phone]

## 2017-06-14 NOTE — Progress Notes (Unsigned)
Spoke with patient will go to near by urgent care for sinusitis.

## 2017-06-14 NOTE — Progress Notes (Unsigned)
Please note patient called stating that she has sinus infection and looking for refill on Amoxicillin. [facility] Please review.

## 2017-06-20 ENCOUNTER — Encounter (HOSPITAL_BASED_OUTPATIENT_CLINIC_OR_DEPARTMENT_OTHER): Payer: Self-pay | Admitting: Pulmonary

## 2017-06-20 DIAGNOSIS — R918 Other nonspecific abnormal finding of lung field: Secondary | ICD-10-CM

## 2017-06-20 NOTE — Progress Notes (Signed)
Problems List, Letters and/or HM may have been updated by Benjamin Zimetbaum, Lung Nodule Coordinator  -Pulmonary.

## 2017-06-22 ENCOUNTER — Encounter (HOSPITAL_BASED_OUTPATIENT_CLINIC_OR_DEPARTMENT_OTHER): Payer: Self-pay | Admitting: Family Medicine

## 2017-06-22 NOTE — Telephone Encounter (Signed)
Covering provider. Agree with need for appointment.

## 2017-06-22 NOTE — Telephone Encounter (Signed)
Emailed patient and forwarded message to PCP for review.

## 2017-07-06 ENCOUNTER — Other Ambulatory Visit (HOSPITAL_BASED_OUTPATIENT_CLINIC_OR_DEPARTMENT_OTHER): Payer: Self-pay | Admitting: Family Medicine

## 2017-07-06 NOTE — Progress Notes (Signed)
Refilled allegra since appears [facility]

## 2017-07-06 NOTE — Progress Notes (Signed)
PER Pharmacy, Victoria Macdonald is a 71 year old female has requested a refill of fexofenadine.      Last Office Visit: 05/23/2017 with durkee, amber  Last Physical Exam: 05/02/2016      Other Med Adult:  Most Recent BP Reading(s)  05/23/17 : 129/72        Cholesterol (mg/dL)   Date Value   03/04/2015 235     LOW DENSITY LIPOPROTEIN DIRECT (mg/dL)   Date Value   03/04/2015 171     HIGH DENSITY LIPOPROTEIN (mg/dL)   Date Value   03/04/2015 45     No results found for: TG      No results found for: TSHSC      No results found for: TSH    No results found for: HGBA1C    No results found for: POCA1C      No results found for: INR    SODIUM (mmol/L)   Date Value   05/18/2016 142       POTASSIUM (mmol/L)   Date Value   05/18/2016 4.4           CREATININE (mg/dL)   Date Value   05/18/2016 0.8       Documented patient preferred pharmacies:    [id number] [address], [place] - [place]  Phone: [phone] Fax: [phone]

## 2017-08-16 ENCOUNTER — Telehealth (HOSPITAL_BASED_OUTPATIENT_CLINIC_OR_DEPARTMENT_OTHER): Payer: Self-pay | Admitting: Medical

## 2017-08-16 ENCOUNTER — Encounter (HOSPITAL_BASED_OUTPATIENT_CLINIC_OR_DEPARTMENT_OTHER): Payer: Self-pay | Admitting: Medical

## 2017-08-16 ENCOUNTER — Ambulatory Visit (HOSPITAL_BASED_OUTPATIENT_CLINIC_OR_DEPARTMENT_OTHER): Payer: Self-pay | Admitting: Family Medicine

## 2017-08-16 ENCOUNTER — Ambulatory Visit (HOSPITAL_BASED_OUTPATIENT_CLINIC_OR_DEPARTMENT_OTHER): Payer: Medicare (Managed Care) | Admitting: Medical

## 2017-08-16 VITALS — BP 119/87 | HR 86 | Temp 97.9°F | Wt 134.0 lb

## 2017-08-16 DIAGNOSIS — G8929 Other chronic pain: Secondary | ICD-10-CM

## 2017-08-16 DIAGNOSIS — M25512 Pain in left shoulder: Principal | ICD-10-CM

## 2017-08-16 DIAGNOSIS — R229 Localized swelling, mass and lump, unspecified: Secondary | ICD-10-CM

## 2017-08-16 LAB — XR SHOULDER LEFT MINIMUM 2 VIEWS

## 2017-08-16 MED ORDER — NAPROXEN 500 MG PO TABS: 500 mg | tablet | Freq: Two times a day (BID) | ORAL | 0 refills | 0 days | Status: DC

## 2017-08-16 MED ORDER — DICLOFENAC SODIUM 1 % TD GEL
2.0000 g | Freq: Two times a day (BID) | TRANSDERMAL | 2 refills | Status: DC
Start: 2017-08-16 — End: 2017-11-14

## 2017-08-16 MED ORDER — NAPROXEN 500 MG PO TABS
500.0000 mg | ORAL_TABLET | Freq: Two times a day (BID) | ORAL | 0 refills | Status: DC
Start: 2017-08-16 — End: 2017-09-27

## 2017-08-16 MED ORDER — DICLOFENAC SODIUM 1 % TD GEL: 2 g | g | Freq: Two times a day (BID) | 2 refills | 0 days | Status: AC

## 2017-08-16 NOTE — Progress Notes (Signed)
Spoke with patient regarding normal x ray.   Pt does have appt with ortho next week, plans to go. Encouraged her to schedule PT as well  She did try the naproxen today and it did help her pain.     Patient verified understanding and all questions were answered.  [address] Durkee, PA-C, 08/16/2017, 6:18 PM

## 2017-08-16 NOTE — Addendum Note (Signed)
Addended by: Everardo Pacific on: 99/77/4142 10:53 AM     Modules accepted: Orders

## 2017-08-16 NOTE — Progress Notes (Signed)
Spoke with patient, see telephone encounter.  

## 2017-08-16 NOTE — Progress Notes (Signed)
[facility]  Office Visit Note   Subjective:   CC: Victoria Macdonald is a 71 year old female patient who presents today for shoulder pain     #left shoulder pain   - acupuncture was helping, then hd to stop because of a trip  - had this 6 y ago - in [place]  - not sure if arthritis or bursitis   - any motion hurts - flexion  - tylenol doesn't help  - used to use voltaren gel in [place] but ran out   - no swelling or redness on shoulder   - no fevers     #lump on neck  - was told it was fat   - feels like it is hurting, not sure if related to shoulder  - [place] 02/2016  - not red   - no fevers     #HM  - flu - declines     ROS:  Per HPI   Objective:   BP 119/87  Pulse 86  Temp 97.9 F (36.6 C) (Temporal)  Wt 60.8 kg (134 lb)  LMP  (LMP Unknown)  SpO2 98%  BMI 24.82 kg/m2  Pain Score: Data Unavailable    Physical Exam:  General: alert, appears stated age and cooperative  Eyes: EOMI, Conjunctiva Clear   Neck: 3 cm soft mass on left side of neck. No lymphadenopathy    Cardio: S1, S2 normal, regular rate and rhythm, no murmurs, rubs or gallops, no LE edema  Pulmonary: clear to auscultation and no wheezing, rhonchi, crackles  MSK: B/l shoulders without deformity, edema, erythema. Left compared to normal right: Nontender scapula, AC joint, clavicle, deltoid insertion, biceps insertion. TTP greater tuberosity, trapezius, SCM, coracoid process. Active flexion and abduction limited by pain to 90 degrees. Slightly improved passive ROM, to 120 degrees but pt having difficulty fully relaxing. Positive painful arc, Neers, Hawkins.    Skin: skin color, texture, turgor are normal, No rashes or lesions  Psych: interactive and Normal Mood   Neuro: Alert and oriented X 3. Strength grossly intact. Normal coordination and gait.    Assessment & Plan:   1. Chronic left shoulder pain  Acute on chronic pain of left shoulder, previously had injections (unsure what type) while living in [place] 6 y ago. Recently helped with acupuncture.  Now pain worsening without new injury. Will get x ray to eval for OA. Possible rotator cuff tendinopathy. Less likely tear. No sign of radiculopathy. Will trial short course of NSAIDs, refer to PT and ortho eval due to chronic nature.   - XR SHOULDER LEFT MINIMUM 2 VW; Future  - REFERRAL TO ORTHOPEDICS ( INT)  - REFERRAL TO PHYSICAL THERAPY ( INT)  - diclofenac (VOLTAREN) 1 % GEL Gel; Apply 2 g topically 2 (two) times daily Do Not Exceed 32 g in 24 Hours  Dispense: 100 g; Refill: 2  - naproxen (NAPROSYN) 500 MG tablet; Take 1 tablet by mouth 2 (two) times daily with meals for 7 days  Dispense: 14 tablet; Refill: 0    2. Localized soft tissue swelling  Unchanged from prior, normal [place] 03/16/2016. Also with several chest CT since then without concerning findings. Reassured of benign nature. Likely pain in the area is due to shoulder.     HM: declined flu shot   Has cpex scheduled     We discussed the patients current medications. The patient expressed understanding and no barriers to adherence were identified.  The patient was given an  AVS  Gwenlyn Found, PA-C 08/16/2017

## 2017-08-21 ENCOUNTER — Other Ambulatory Visit (HOSPITAL_BASED_OUTPATIENT_CLINIC_OR_DEPARTMENT_OTHER): Payer: Self-pay | Admitting: Ambulatory Care

## 2017-08-21 DIAGNOSIS — M25512 Pain in left shoulder: Secondary | ICD-10-CM

## 2017-08-22 ENCOUNTER — Ambulatory Visit (HOSPITAL_BASED_OUTPATIENT_CLINIC_OR_DEPARTMENT_OTHER): Payer: Medicare (Managed Care) | Admitting: Orthopaedic Surgery

## 2017-08-22 VITALS — BP 116/64 | HR 67 | Resp 20

## 2017-08-22 DIAGNOSIS — M7502 Adhesive capsulitis of left shoulder: Secondary | ICD-10-CM

## 2017-08-22 NOTE — Patient Instructions (Signed)
Patient Education     Adhesive Capsulitis  Adhesive capsulitis is inflammation of the tendons and ligaments that surround the shoulder joint (shoulder capsule). This condition causes the shoulder to become stiff and painful to move. Adhesive capsulitis is also called frozen shoulder.  What are the causes?  This condition may be caused by:   An injury to the shoulder joint.   Straining the shoulder.   Not moving the shoulder for a period of time. This can happen if your arm was injured or in a sling.   Long-standing health problems, such as:  ? Diabetes.  ? Thyroid problems.  ? Heart disease.  ? Stroke.  ? Rheumatoid arthritis.  ? Lung disease.    In some cases, the cause may not be known.  What increases the risk?  This condition is more likely to develop in:   Women.   People who are older than 70 years of age.    What are the signs or symptoms?  Symptoms of this condition include:   Pain in the shoulder when moving the arm. There may also be pain when parts of the shoulder are touched. The pain is worse at night or when at rest.   Soreness or aching in the shoulder.   Inability to move the shoulder normally.   Muscle spasms.    How is this diagnosed?  This condition is diagnosed with a physical exam and imaging tests, such as an X-ray or MRI.  How is this treated?  This condition may be treated with:   Treatment of the underlying cause or condition.   Physical therapy. This involves performing exercises to get the shoulder moving again.   Medicine. Medicine may be given to relieve pain, inflammation, or muscle spasms.   Steroid injections into the shoulder joint.   Shoulder manipulation. This is a procedure to move the shoulder into another position. It is done after you are given a medicine to make you fall asleep (general anesthetic). The joint may also be injected with salt water at high pressure to break down scarring.   Surgery. This may be done in severe cases when other treatments have  failed.    Although most people recover completely from adhesive capsulitis, some may not regain the full movement of the shoulder.  Follow these instructions at home:   Take over-the-counter and prescription medicines only as told by your health care provider.   If you are being treated with physical therapy, follow instructions from your physical therapist.   Avoid exercises that put a lot of demand on your shoulder, such as throwing. These exercises can make pain worse.   If directed, apply ice to the injured area:  ? Put ice in a plastic bag.  ? Place a towel between your skin and the bag.  ? Leave the ice on for 20 minutes, 2-3 times per day.  Contact a health care provider if:   You develop new symptoms.   Your symptoms get worse.  This information is not intended to replace advice given to you by your health care provider. Make sure you discuss any questions you have with your health care provider.  Document Released: 08/06/2009 Document Revised: 03/16/2016 Document Reviewed: 02/01/2015  Elsevier Interactive Patient Education  2017 [employer].

## 2017-08-22 NOTE — Progress Notes (Signed)
Review of Systems   Constitutional: Negative.    HENT: Negative.    Eyes: Negative.    Respiratory: Negative.    Cardiovascular: Negative.    Gastrointestinal: Positive for nausea.   Genitourinary: Negative.    Musculoskeletal: Negative.    Skin: Negative.    Neurological: Negative.    Endo/Heme/Allergies: Negative.    Psychiatric/Behavioral: Negative.

## 2017-08-22 NOTE — Progress Notes (Signed)
CC: Patient presents with:  Shoulder Pain: left shoulder pain      Victoria Macdonald is being seen in consultation at the request of Fredirick Lathe, MD for left shoulder pain.       HPI: Victoria Macdonald is a 71 year old female presenting with left shoulder pain. She initially had pain 6 years ago, had some injections while in [place] and pain resolved. Pain started up again around 3 months ago, no injury. She reports constant pain about the superior and anterior shoulder. Pain is worse with movement and better with rest.     Associated symptoms- no numbness or tingling; locking; swelling; no fevers/chills/SOB     Re: HPI, see also scanned New Patient form for additional info provided by patient     ROS: Review of systems filled out by patient and scanned into the medical record. Reviewed by me. All other systems are reviewed and are negative except as noted in HPI.    PMH: Past Medical History:  No date: Back pain  05/13/2015: LLQ pain    Surgical HX: Past Surgical History:  ~1985: ABDOMINAL SURGERY  ~1975: TUBAL LIGATION    SH:   Social History     Socioeconomic History    Marital status: Single     Spouse name: Not on file    Number of children: Not on file    Years of education: Not on file    Highest education level: Not on file   Social Needs    Financial resource strain: Not on file    Food insecurity - worry: Not on file    Food insecurity - inability: Not on file    Transportation needs - medical: Not on file    Transportation needs - non-medical: Not on file   Occupational History    Not on file   Tobacco Use    Smoking status: Current Every Day Smoker     Packs/day: 0.25     Years: 53.00     Pack years: 13.25    Smokeless tobacco: Never Used   Substance and Sexual Activity    Alcohol use: No     Alcohol/week: 0.0 oz    Drug use: No    Sexual activity: Not Currently   Other Topics Concern    Not on file   Social History Narrative    2017    Lives alone    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[employer], helps others with translation    Diet- changed because of heartburn- yogurt, potatoes, broccoli    Exercise- walking a little     Wears her seatbelt    Denies safety concerns    Faith is important to her       Allergies: Review of Patient's Allergies indicates:   Lactose intolerance*    Other (See Comments)    Comment:bloating    Current Medications:   Current Outpatient Medications:     diclofenac (VOLTAREN) 1 % GEL Gel, Apply 2 g topically 2 (two) times daily Do Not Exceed 32 g in 24 Hours, Disp: 100 g, Rfl: 2    naproxen (NAPROSYN) 500 MG tablet, Take 1 tablet by mouth 2 (two) times daily with meals for 7 days, Disp: 14 tablet, Rfl: 0    fexofenadine (ALLEGRA) 180 MG tablet, Take 1 tablet by mouth daily, Disp: 90 tablet, Rfl: 3    raNITIdine (ZANTAC) 150 MG tablet, Take  1 tablet by mouth 2 (two) times daily, Disp: 180 tablet, Rfl: 3    fluticasone (FLONASE) 50 MCG/ACT nasal spray, 1 spray by Each Nostril route daily, Disp: 32 mL, Rfl: 5    meloxicam (MOBIC) 15 MG tablet, Take 1 tablet by mouth daily as needed for Pain, Disp: 90 tablet, Rfl: 3    sertraline (ZOLOFT) 25 MG tablet, Take 1 tablet by mouth daily, Disp: 90 tablet, Rfl: 0    omeprazole (PRILOSEC) 20 MG capsule, Take 1 capsule by mouth daily before breakfast, Disp: 90 capsule, Rfl: 3    DOXYCYCLINE HYCLATE PO, Take 100 mg by mouth every 12 (twelve) hours, Disp: , Rfl:     LORazepam (ATIVAN) 0.5 MG tablet, Take 0.5 mg by mouth nightly as needed, Disp: , Rfl: 0    Re: past history, social history, meds and allergies, see also scanned New Patient form for additional info provided by patient    Physical Exam    08/22/17  1049   BP: 116/64   Pulse: 67   Resp: 20   SpO2: 99%     General Appearance: Clean, well dressed, well groomed.  Alert and oriented x3, pleasant and cooperative  Mood: Euthymic.  Eyes: Pupils round and reactive.  Breathing/Respiratory: Non-labored.  Upper Extremities:   Right  UE: No skin changes. Normal with regards to inspection, palpation, ROM, stability, and strength. All tendons grossly intact and functioning, neuro intact, vascular intact.   Left UE: No skin changes. Stiffness with ER- ROM 10 degrees, firm endpoint. All tendons grossly intact and functioning, neuro intact, vascular intact.   Gait/Posture: Normal.   Coordination and balance: grossly normal    Imaging - Available images visualized by me and reports reviewed. No fractures, dislocations or loose bodies seen.    A/P: This is an 71 year old female with 3 months of left shoulder pain consistent with adhesive capsulitis. She was educated on the diagnosis and treatment options. We will order her a fluoro guided Alba injection and refer her to PT. We can see her back in 3 months for repeat exam.     We reviewed the likely diagnosis, the prognosis, and various treatment options in detail.   Risks and benefits of treatment plan discussed.   Patient's questions have been answered, patient understands condition and agrees with treatment plan.     Dr. Williams Che saw and examined the patient and formulated the assessment and plan.    Bary Castilla, PA-C, 08/22/2017, 11:38 AM

## 2017-08-22 NOTE — Progress Notes (Signed)
I, Sarina Ill MD, interviewed and examined the paient, reviewed the images and reports, and agree with the impression and plan, formulated by me, as per the note attached.     Victoria Macdonald is a 71 year old female    Exam suggest frozen shoulder.  Fluoroscopy guided glenohumeral cortisone injection ordered.  Physical therapy referral placed.    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

## 2017-08-28 ENCOUNTER — Ambulatory Visit (HOSPITAL_BASED_OUTPATIENT_CLINIC_OR_DEPARTMENT_OTHER): Payer: Self-pay | Admitting: Physician Assistant

## 2017-08-28 DIAGNOSIS — M25512 Pain in left shoulder: Secondary | ICD-10-CM

## 2017-08-28 LAB — XR LARGE JOINT OR BURSA INJECTION/ASPIRATION

## 2017-08-28 MED ORDER — ROPIVACAINE HCL 5 MG/ML IJ SOLN
INTRAMUSCULAR | Status: AC
Start: 2017-08-28 — End: 2017-08-28
  Administered 2017-08-28: 5 mL via INTRA_ARTICULAR
  Filled 2017-08-28: qty 30

## 2017-08-28 MED ORDER — LIDOCAINE HCL (PF) 1 % IJ SOLN
INTRAMUSCULAR | Status: AC
Start: 2017-08-28 — End: 2017-08-29
  Filled 2017-08-28: qty 5

## 2017-08-28 MED ORDER — SODIUM BICARBONATE 4 % IV SOLN
INTRAVENOUS | Status: AC
Start: 2017-08-28 — End: 2017-08-29
  Filled 2017-08-28: qty 5

## 2017-08-28 MED ORDER — TRIAMCINOLONE ACETONIDE 40 MG/ML IJ SUSP
INTRAMUSCULAR | Status: AC
Start: 2017-08-28 — End: 2017-08-28
  Administered 2017-08-28: 40 mg
  Filled 2017-08-28: qty 1

## 2017-08-28 MED ORDER — SODIUM CHLORIDE 0.9 % IJ SOLN
INTRAMUSCULAR | Status: AC
Start: 2017-08-28 — End: 2017-08-29
  Filled 2017-08-28: qty 20

## 2017-08-28 MED ORDER — LIDOCAINE HCL 1 % IJ SOLN
1.00 mL | Freq: Once | INTRAMUSCULAR | Status: AC
Start: 2017-08-28 — End: 2017-08-28
  Administered 2017-08-28: 3 mL

## 2017-08-28 MED ORDER — IOHEXOL 300 MG/ML IJ SOLN
1.00 mL | Freq: Once | INTRAMUSCULAR | Status: AC
Start: 2017-08-28 — End: 2017-08-28
  Administered 2017-08-28: 2 mL via INTRA_ARTICULAR

## 2017-08-28 NOTE — Addendum Note (Signed)
Addended by: Remi Haggard on: 08/28/2017 11:00 AM     Modules accepted: Orders

## 2017-08-28 NOTE — Pre-Procedure Instructions (Signed)
Pre-Procedure Note       History: Left shoulder pain    Physical: No shoulder erythema or edema     Allergies: Reviewed today. No known allergy to lidocaine.    Planned Anesthesia: Local only     Planned Procedure: Left shoulder injection    Please do not hesitate to call or page with questions.    Evette Doffing, MD  Interventional Radiologist  Pager [phone]

## 2017-08-28 NOTE — Addendum Note (Signed)
Addended by: Krystal Clark on: 08/28/2017 11:00 AM     Modules accepted: Orders, SmartSet

## 2017-08-28 NOTE — Discharge Instructions (Signed)
Joint Injection, Care After  Refer to this sheet in the next few days. These instructions provide you with information on caring for yourself after you have had a joint injection. Your caregiver also may give you more specific instructions. Your treatment has been planned according to current medical practices, but problems sometimes occur. Call your caregiver if you have any problems or questions after your procedure.  After any type of joint injection, it is not uncommon to experience:  · Soreness, swelling, or bruising around the injection site.  · Mild numbness, tingling, or weakness around the injection site caused by the numbing medicine used before or with the injection.  It also is possible to experience the following effects associated with the specific agent after injection:  · Iodine-based contrast agents:    Allergic reaction (itching, hives, widespread redness, and swelling beyond the injection site).  · Corticosteroids (These effects are rare.):    Allergic reaction.    Increased blood sugar levels (If you have diabetes and you notice that your blood sugar levels have increased, notify your caregiver).    Increased blood pressure levels.    Mood swings.      These effects all should resolve within a day after your procedure.   HOME CARE INSTRUCTIONS  · Limit yourself to light activity the day of your procedure. Avoid lifting heavy objects, bending, stooping, or twisting.  · Take prescription or over-the-counter pain medication as directed by your caregiver.  · You may apply ice to your injection site to reduce pain and swelling the day of your procedure. Ice may be applied 03-04 times:    Put ice in a plastic bag.    Place a towel between your skin and the bag.    Leave the ice on for no longer than 15-20 minutes each time.  SEEK IMMEDIATE MEDICAL CARE IF:   · Pain and swelling get worse rather than better or extend beyond the injection site.  · Numbness does not go away.  · Blood or fluid continues to  leak from the injection site.  · You have chest pain.  · You have swelling of your face or tongue.  · You have trouble breathing or you become dizzy.  · You develop a fever, chills, or severe tenderness at the injection site that last longer than 1 day.  MAKE SURE YOU:  · Understand these instructions.  · Watch your condition.  · Get help right away if you are not doing well or if you get worse.                        .

## 2017-08-28 NOTE — Brief Op Note (Signed)
Brief Procedure Note      Procedure: Left shoulder steroid injection    Type of Anesthesia: Local only    Injection: 40 mg of Kenalog, 58ml 0.5% ropivocaine    Complications: None immediately    Evette Doffing, MD  Interventional Radiologist  Pager [phone]

## 2017-09-09 ENCOUNTER — Other Ambulatory Visit (HOSPITAL_BASED_OUTPATIENT_CLINIC_OR_DEPARTMENT_OTHER): Payer: Self-pay | Admitting: Family Medicine

## 2017-09-09 DIAGNOSIS — K219 Gastro-esophageal reflux disease without esophagitis: Secondary | ICD-10-CM

## 2017-09-09 NOTE — Progress Notes (Signed)
PER Pharmacy, Victoria Macdonald is a 71 year old female has requested a refill of ranitidine.  New scrip is needed last script was send to the mail order    Last Office Visit: 10/31//2018 with Sarina Ill  Last Physical Exam: 05/02/2016      Other Med Adult:  Most Recent BP Reading(s)  08/22/17 : 116/64        Cholesterol (mg/dL)   Date Value   03/04/2015 235     LOW DENSITY LIPOPROTEIN DIRECT (mg/dL)   Date Value   03/04/2015 171     HIGH DENSITY LIPOPROTEIN (mg/dL)   Date Value   03/04/2015 45     No results found for: TG      No results found for: TSHSC      No results found for: TSH    No results found for: HGBA1C    No results found for: POCA1C      No results found for: INR    SODIUM (mmol/L)   Date Value   05/18/2016 142       POTASSIUM (mmol/L)   Date Value   05/18/2016 4.4           CREATININE (mg/dL)   Date Value   05/18/2016 0.8       Documented patient preferred pharmacies:    [id number] [address], [place] - [place]  Phone: [phone] Fax: [phone]

## 2017-09-27 ENCOUNTER — Encounter (HOSPITAL_BASED_OUTPATIENT_CLINIC_OR_DEPARTMENT_OTHER): Payer: Self-pay | Admitting: Family Medicine

## 2017-09-27 ENCOUNTER — Ambulatory Visit (HOSPITAL_BASED_OUTPATIENT_CLINIC_OR_DEPARTMENT_OTHER): Payer: Medicare (Managed Care) | Admitting: Family Medicine

## 2017-09-27 VITALS — BP 136/73 | HR 87 | Temp 97.0°F | Ht 61.0 in | Wt 132.8 lb

## 2017-09-27 DIAGNOSIS — F4321 Adjustment disorder with depressed mood: Secondary | ICD-10-CM

## 2017-09-27 DIAGNOSIS — Z1159 Encounter for screening for other viral diseases: Secondary | ICD-10-CM

## 2017-09-27 DIAGNOSIS — R0981 Nasal congestion: Secondary | ICD-10-CM | POA: Insufficient documentation

## 2017-09-27 DIAGNOSIS — K635 Polyp of colon: Secondary | ICD-10-CM

## 2017-09-27 DIAGNOSIS — F411 Generalized anxiety disorder: Secondary | ICD-10-CM

## 2017-09-27 DIAGNOSIS — M7502 Adhesive capsulitis of left shoulder: Secondary | ICD-10-CM | POA: Insufficient documentation

## 2017-09-27 DIAGNOSIS — Z0000 Encounter for general adult medical examination without abnormal findings: Secondary | ICD-10-CM

## 2017-09-27 DIAGNOSIS — F17200 Nicotine dependence, unspecified, uncomplicated: Secondary | ICD-10-CM

## 2017-09-27 DIAGNOSIS — Z8619 Personal history of other infectious and parasitic diseases: Secondary | ICD-10-CM

## 2017-09-27 DIAGNOSIS — K219 Gastro-esophageal reflux disease without esophagitis: Secondary | ICD-10-CM

## 2017-09-27 NOTE — Progress Notes (Signed)
Medicare Annual Wellness Visit  Progress Note     Subjective:   Victoria Macdonald is a 71 year old female who comes for the Subsequent Annual wellness visit, including Personalized Prevention Plan (PPPE) (it has been at least 12 months since the annual wellness visit)..     Adhesive capsulitis:  Injection helped  Will schedule PT    GAD and adjustment disorder:  Doing OK  Distracts herself with games and doing self care  Citalopram helped mood but caused GI distress  Sertraline also caused GI distress so rather be off all things  Not taking lorazepam    GERD, H pylori infection and Colon polyp  Had EGD and colonoscopy in 2018  Tx for H pylori and pain improved  Has not had TOC   Has adjusted her diet   Still taking ranitidine  Occasional omeprazole use    Tobacco use  Pt increases and decreases use  Currently using less at 5cig/day  Feels OK about this    Nasal congestion:  Pt on fexofenadine and nasal steroid spray  Pt feels some foods- dairy and now others makes congestion worse  Wondering if there is anything else to be done  Seen by ENT last year and recommend 4-6 week follow up    History: (These sections have been updated in their respective activities)   Review of Patient's Allergies indicates:   Lactose intolerance*    Other (See Comments)    Comment:bloating    Current Outpatient Medications on File Prior to Visit:  artificial tears (LUBRIFRESH P.M.) OINT Apply to eye Disp:  Rfl:    raNITIdine (ZANTAC) 150 MG tablet TAKE 1 TABLET BY MOUTH TWICE A DAY Disp: 60 tablet Rfl: 11   diclofenac (VOLTAREN) 1 % GEL Gel Apply 2 g topically 2 (two) times daily Do Not Exceed 32 g in 24 Hours Disp: 100 g Rfl: 2   fexofenadine (ALLEGRA) 180 MG tablet Take 1 tablet by mouth daily Disp: 90 tablet Rfl: 3   fluticasone (FLONASE) 50 MCG/ACT nasal spray 1 spray by Each Nostril route daily Disp: 32 mL Rfl: 5   meloxicam (MOBIC) 15 MG tablet Take 1 tablet by mouth daily as needed for Pain Disp: 90 tablet Rfl: 3   omeprazole  (PRILOSEC) 20 MG capsule Take 1 capsule by mouth daily before breakfast Disp: 90 capsule Rfl: 3     No current facility-administered medications on file prior to visit.   Past Medical History:  No date: Back pain  05/13/2015: LLQ pain  Past Surgical History:  ~1985: ABDOMINAL SURGERY  ~1975: TUBAL LIGATION  Social History    Tobacco Use      Smoking status: Current Every Day Smoker        Packs/day: 0.25        Years: 53.00        Pack years: 13.25      Smokeless tobacco: Never Used      Tobacco comment: currently smoking 5/day    Alcohol use: No      Alcohol/week: 0.0 oz    Drug use: No        Providers and Suppliers (See demographics, encounters tab, filter by provider)  Fredirick Lathe, MD  [facility]    Screening:     Cognitive screen -  Mini cognitive test--Patient was given the following instructions  1. Repeat the following: APPLE, WATCH, PENNY  2. Draw the hours of a clock inside a circle, placing the hands of  the clock to represent the time forty five minutes past ten oclock  3. Repeat the three words given previously    Scoring--Number of words recalled properly:  3: Regardless of clock drawing--Negative screen for dementia (no need to score clock)    Cognitive impairment is not detected byobservation, patient report and minicog   Depression screen, See Questions 3-4 of Health Risk Assessment.  Last PHQ9 total score (Recorded from the Screening Questions section):  PHQ-9 TOTAL SCORE PHQ-9 TOTAL SCORE 04/09/2017 03/08/2016   Doc FlowSheet Total Score 2 (No Data)                         Vision screen -    COmpleted through optho this year   Hearing loss screen -  Trouble hearing the television or radio when others can hear it? No  Straining or struggling to hear/understand conversations? No   Falls risk screen, See Questions 19-20 of Health Risk Assessment. Function screen, See Questions 8-13 of Health Risk Assessment.   Screen for overweight and hypertension:   Body mass index is 25.09  kg/m.     One-time screening for abdominal aortic aneurysm (AAA) by ultrasonography in men aged 25 to 23 who have ever smoked:  Is not indicated in this female who reports that she has been smoking.  She has a 13.25 pack-year smoking history. she has never used smokeless tobacco.   Hepatitis B risk Assessment:    Because of lack of identifiable risk factors, his risk for hepatitis B is low (1) (moderate or high: consider Hep B vaccination)     Home safety screen:    Does home have throw rugs, poor lighting or slippery bathtub or shower? No  Does home LACK grab bars in bathrooms or handrails on stairs and steps? No  Does home LACK functioning smoke alarms? No   Health Risk Assessment: Recorded in the Screening Questions Section, can be reviewed in Flowsheets activity tab under [place]'.     All above screening responses were reviewed, and all positive or abnormal responses were further evaluation or sent for referral as appropriate.      ROS:   GEN: No fatigue, weight loss, weight gain, dizziness, syncope, fevers or chills  HEENT: No headache, visual changes, eye pain, blurry vision, earaches, hearing loss, eye or ear discharge, runny nose, mouth sores, sore throat.  NECK: No neck pain, limited ROM, lumps or bumps.  CHEST: No chest pain or palpitations.  LUNGS: No SOB or cough.  GI: No reflux, abdominal pain, nausea, vomiting, diarrhea, constipation, or hemorrhoids.  GU: No dysuria, burning, itching, frequency, urgency or discharge.  MUSC: No muscle or joint pain.  DERM: No rash, itch or changing moles  NEURO: No numbness, tingling, weakness or seizures.  PSYCH: No depression or anxiety. Mood stable.      Physical Exam:   BP 136/73  Pulse 87  Temp 97 F (36.1 C) (Temporal)  Ht 5\' 1"  (1.549 m)  Wt 60.2 kg (132 lb 12.8 oz)  LMP  (LMP Unknown)  SpO2 98%  BMI 25.09 kg/m2  General: well-appearing, comfortable, no apparent distress   HEENT:  PERRL, sclera non-injected, OP without erythema/edema, bilateral  inner eyes with <0.5cm papules, Well healing scars above eyebrows, MMM  Neck: supple, no lymphadenopathy, no thyromegaly    Lungs: clear to auscultation bilaterally, no wheezes/rales/ronchi  Heart: Regular rate and rhythm, normal S1 and S2, no murmurs, rubs or gallop  Abdomen: soft,  nontender, nondistended, normoactive bowel sounds, no organomegaly or masses  Extremities: symmetric without deformities, well-perfused, no edema   Skin: no rashes or lesions   Neuro: alert and oriented, cranial nerves II-XII grossly intact, strength 5/5 bilaterally upper and lower extremities, sensation grossly intact,  patellar reflexes 2+, gait normal  Psych: alert, oriented, mood euthymic, affect normal, speech normal, thought process goal-oriented      Counseling:     Advanced Care Planning:  The patient's Code Status is documented as Full Code  The patient has identified a Healthcare Proxy (recorded in demographics section)    Health Maintenance: See Health Maintenance Report for details of screening done.      Assessment & Plan:     1. Encounter for Medicare annual wellness exam  S/p eye surgery. Pt to follow up with surgeon about healing and bilateral papules  - FULL CODE  - COMPREHENSIVE METABOLIC PANEL; Future  - COLLECTION VENOUS BLOOD VENIPUNCTURE; Future    2. Sessile colonic polyp  Next colonoscopy in 2021    3. Adjustment disorder with depressed mood  4. GAD (generalized anxiety disorder)  Stable off medications  Pt to continue self care    5. Adhesive capsulitis of left shoulder  Improved with injection and plan to call for PT    6. Gastroesophageal reflux disease without esophagitis  Pt to continue ranitidine and omeprazole PRN  Discussed continuing healthy diet     7. Nasal congestion  Unclear triggers. Pt will try non-dairy milks and see if same trigger  New referral to ENT    8. History of Helicobacter pylori infection  TOC due   - HELICOBACTER PYLORI STOOL AG; Future    9. Tobacco use disorder  <3 min - pt  precontemplative about quitting  Continuing to cut down    10. Need for hepatitis C screening test  Routine per age and immigration with higher rates of Hep B   - HEPATITIS B SURFACE AB QUANT; Future  - HEPATITIS B CORE ANTIBODY; Future  - HEPATITIS C ANTIBODY; Future  - HEPATITIS B SURFACE ANTIGEN; Future        Personalized Instructions:   See Patient Instruction section

## 2017-10-25 ENCOUNTER — Ambulatory Visit (HOSPITAL_BASED_OUTPATIENT_CLINIC_OR_DEPARTMENT_OTHER): Payer: Medicare (Managed Care) | Admitting: Otolaryngology

## 2017-11-08 ENCOUNTER — Other Ambulatory Visit (HOSPITAL_BASED_OUTPATIENT_CLINIC_OR_DEPARTMENT_OTHER): Payer: Self-pay | Admitting: Family Medicine

## 2017-11-08 DIAGNOSIS — G8929 Other chronic pain: Secondary | ICD-10-CM

## 2017-11-08 DIAGNOSIS — M5442 Lumbago with sciatica, left side: Principal | ICD-10-CM

## 2017-11-08 NOTE — Progress Notes (Signed)
PER Pharmacy, Victoria Macdonald is a 72 year old female has requested a refill of meloxicam.    Please note patient [facility] Mail order pharmacies do not do transfers. Please send new rx to patients preferred pharmacy if appropriate.    Last Office Visit: 09/27/2017 with D'Agata, C  Last Physical Exam: 09/27/2017      Other Med Adult:  Most Recent BP Reading(s)  09/27/17 : 136/73        Cholesterol (mg/dL)   Date Value   03/04/2015 235     LOW DENSITY LIPOPROTEIN DIRECT (mg/dL)   Date Value   03/04/2015 171     HIGH DENSITY LIPOPROTEIN (mg/dL)   Date Value   03/04/2015 45     No results found for: TG      No results found for: TSHSC      No results found for: TSH    No results found for: HGBA1C    No results found for: POCA1C      No results found for: INR    SODIUM (mmol/L)   Date Value   05/18/2016 142       POTASSIUM (mmol/L)   Date Value   05/18/2016 4.4           CREATININE (mg/dL)   Date Value   05/18/2016 0.8       Documented patient preferred pharmacies:    [id number] [address], [place]  Phone: [phone] Fax: [phone]

## 2017-11-19 ENCOUNTER — Encounter (HOSPITAL_BASED_OUTPATIENT_CLINIC_OR_DEPARTMENT_OTHER): Payer: Self-pay | Admitting: Family Medicine

## 2017-11-22 ENCOUNTER — Ambulatory Visit (HOSPITAL_BASED_OUTPATIENT_CLINIC_OR_DEPARTMENT_OTHER): Payer: Medicare (Managed Care) | Admitting: Otolaryngology

## 2017-11-27 ENCOUNTER — Ambulatory Visit (HOSPITAL_BASED_OUTPATIENT_CLINIC_OR_DEPARTMENT_OTHER): Payer: Medicare (Managed Care) | Admitting: Orthopaedic Surgery

## 2017-12-20 ENCOUNTER — Other Ambulatory Visit (HOSPITAL_BASED_OUTPATIENT_CLINIC_OR_DEPARTMENT_OTHER): Payer: Self-pay | Admitting: Family Medicine

## 2017-12-20 DIAGNOSIS — J310 Chronic rhinitis: Secondary | ICD-10-CM

## 2017-12-20 NOTE — Progress Notes (Signed)
PER Pharmacy, Victoria Macdonald is a 72 year old female has requested a refill of FLONASE NASAL SPRAY .      Last Office Visit: 09/27/17   with PCP   Last Physical Exam: 09/27/17    There are no preventive care reminders to display for this patient.    Other Med Adult:  Most Recent BP Reading(s)  09/27/17 : 136/73        Cholesterol (mg/dL)   Date Value   03/04/2015 235     LOW DENSITY LIPOPROTEIN DIRECT (mg/dL)   Date Value   03/04/2015 171     HIGH DENSITY LIPOPROTEIN (mg/dL)   Date Value   03/04/2015 45     No results found for: TG      No results found for: TSHSC      No results found for: TSH    No results found for: HGBA1C    No results found for: POCA1C      No results found for: INR    SODIUM (mmol/L)   Date Value   05/18/2016 142       POTASSIUM (mmol/L)   Date Value   05/18/2016 4.4           CREATININE (mg/dL)   Date Value   05/18/2016 0.8       Documented patient preferred pharmacies:    [id number] [address], [place] - [place]  Phone: [phone] Fax: [phone]

## 2018-01-15 ENCOUNTER — Encounter (HOSPITAL_BASED_OUTPATIENT_CLINIC_OR_DEPARTMENT_OTHER): Payer: Self-pay | Admitting: Family Medicine

## 2018-03-01 ENCOUNTER — Encounter (HOSPITAL_BASED_OUTPATIENT_CLINIC_OR_DEPARTMENT_OTHER): Payer: Self-pay | Admitting: Medical

## 2018-03-01 ENCOUNTER — Other Ambulatory Visit (HOSPITAL_BASED_OUTPATIENT_CLINIC_OR_DEPARTMENT_OTHER): Payer: Self-pay

## 2018-03-01 ENCOUNTER — Ambulatory Visit (HOSPITAL_BASED_OUTPATIENT_CLINIC_OR_DEPARTMENT_OTHER): Payer: Medicare (Managed Care) | Admitting: Medical

## 2018-03-01 VITALS — BP 144/72 | HR 86 | Wt 133.0 lb

## 2018-03-01 DIAGNOSIS — N898 Other specified noninflammatory disorders of vagina: Secondary | ICD-10-CM

## 2018-03-01 DIAGNOSIS — Z1159 Encounter for screening for other viral diseases: Secondary | ICD-10-CM

## 2018-03-01 DIAGNOSIS — R030 Elevated blood-pressure reading, without diagnosis of hypertension: Secondary | ICD-10-CM

## 2018-03-01 DIAGNOSIS — Z780 Asymptomatic menopausal state: Secondary | ICD-10-CM

## 2018-03-01 DIAGNOSIS — Z8619 Personal history of other infectious and parasitic diseases: Secondary | ICD-10-CM

## 2018-03-01 NOTE — Progress Notes (Signed)
[facility] FAMILY MEDICINE  Office Visit Note   Subjective:   CC: Victoria Macdonald is a 72 year old female patient who presents today for vaginal discharge     #vaginal discharge  - was treated in [place] 5 y ago for this   - "I made a mistake and told them I was bleeding and they did a bunch of awful tests on me"  - thought she had cancer and said she didn't after lots of tests  - went away  - started again 1 year ago - she is not really concerned but a friend told her she needed to get it checked out  - needs to wear pad most days   - some days is yellow discharge  - mostly is dark brown, sometimes redish  - doesn't think it's bleeding  - wants to be sure not infection   - has not had sex in 77 years  - no odor   - no itching   - no abdominal pain  - does not notice blood in urine   - saw gyn here 2016 - told NTD   - no new skin cleaning products, no cleaning inside vagina   - no irritation or feeling of dryness   - has had normal paps - in [place] - 02/2015, states all normal before that   - no hormone treatment in the past   - sister had cancer - thinks had to take out uterus and ovaries - was after age 75    - no itching, no abd pain, no dysuria, no blood in toilet, no urinary symptoms   - no weight loss   - smoking: used to smoke 1 ppd now for few years is 1/2 ppd x 55 years     #BP  - had coffee and cigarette this morning   - no CP, SOB  - no BP problems in the past     2016 [place]  CONCLUSION:    1. 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    2. The left ovary is visualized and shows no abnormality.      ROS:  Per HPI   Objective:   BP (!) 155/76  Pulse 86  Wt 60.3 kg (133 lb)  LMP  (LMP Unknown)  SpO2 99%  BMI 25.13 kg/m2  Pain Score: Data Unavailable  Recheck:BP (!) 144/72  Pulse 86  Wt 60.3 kg (133 lb)  LMP  (LMP Unknown)  SpO2 99%  BMI 25.13 kg/m2    Physical Exam:  General: alert, appears stated age and  cooperative  Eyes: EOMI, Conjunctiva Clear  Cardio: S1, S2 normal, regular rate and rhythm, no murmurs, rubs or gallops, no LE edema  Pulmonary: clear to auscultation and no wheezing, rhonchi, crackles  GI: soft, non-tender. Bowel sounds normal. No masses,  no organomegaly  Psych: interactive and Normal Mood   Neuro: Alert and oriented   GU: normal external female genitalia. Atrophic changes. No vulvar, vaginal or cervical lesions. Scant physiologic appearing discharge. No blood. No CMT. Does have slight left adnexal fullness and TTP.     Assessment & Plan:   1. Vaginal discharge  2. Postmenopausal  72 y/o F with report of negative workup for postmenopausal bleeding 5 y ago in [place] (records requested) and now return of discharge that is sometimes yellow, mostly dark brown, rarely red. No other vaginitis symptoms, very low risk for STI,  but pt strongly desires to r/o infection and pt strongly denies that this is vaginal bleeding (as it seems she is fearful of another workup like she had in the past). No h/o hormone therapy, we have one documented normal pap in 2016 from outside clinic and pt states she had several normals prior. She does have >30 pack year smoking history. Slight left adnexal fullness and TTP on exam, so getting [place]. Referring to gyn for workup.   - Discussed importance of r/o malignancy   - VAGINITIS PANEL PCR  - AMPLIFIED GENPROBE CHLAM/GC  - US PELVIC NONOBSTETRIC REAL-TIME IMAGE COMPLETE; Future  - REFERRAL TO GYNECOLOGY (INT)    3. Need for hepatitis C screening test  Released orders from physical  - HEPATITIS B SURFACE ANTIGEN  - HEPATITIS C ANTIBODY  - HEPATITIS B CORE ANTIBODY  - HEPATITIS B SURFACE AB QUANT    4. Elevated BP without diagnosis of hypertension  At goal on recheck. Released orders from prior physical.  - COMPREHENSIVE METABOLIC PANEL    HM: labs     We discussed the patients current medications. The patient expressed understanding and no barriers to adherence were  identified.  The patient was given an AVS  [employer], PA-C 03/01/2018

## 2018-03-01 NOTE — Addendum Note (Signed)
Addended by: Clydell Hakim on: 03/01/2018 04:11 PM     Modules accepted: Orders

## 2018-03-02 LAB — COMPREHENSIVE METABOLIC PANEL
ALANINE AMINOTRANSFERASE: 17 U/L (ref 12–45)
ALBUMIN: 4.1 g/dL (ref 3.4–5.0)
ALKALINE PHOSPHATASE: 65 U/L (ref 45–117)
ANION GAP: 5 mmol/L (ref 5–15)
ASPARTATE AMINOTRANSFERASE: 14 U/L (ref 8–34)
BILIRUBIN TOTAL: 0.9 mg/dL (ref 0.2–1.0)
BUN (UREA NITROGEN): 14 mg/dL (ref 7–18)
CALCIUM: 9.1 mg/dL (ref 8.5–10.1)
CARBON DIOXIDE: 29 mmol/L (ref 21–32)
CHLORIDE: 109 mmol/L — ABNORMAL HIGH (ref 98–107)
CREATININE: 0.8 mg/dL (ref 0.4–1.2)
ESTIMATED GLOMERULAR FILT RATE: >60 mL/min (ref >60)
Glucose Random: 82 mg/dL (ref 74–160)
POTASSIUM: 4.4 mmol/L (ref 3.5–5.1)
SODIUM: 143 mmol/L (ref 136–145)
TOTAL PROTEIN: 7.2 g/dL (ref 6.4–8.2)

## 2018-03-02 LAB — HEPATITIS C ANTIBODY: HEPATITIS C ANTIBODY: REACTIVE — AB

## 2018-03-02 LAB — VAGINITIS PANEL PCR
BACTERIAL VAGINOSIS: NEGATIVE
CANDIDA GLABRATA: NEGATIVE
CANDIDA GROUP: NEGATIVE
CANDIDA KRUSEI: NEGATIVE
TRICH VAGINALIS: NEGATIVE

## 2018-03-02 LAB — HEPATITIS B SURFACE AG CONFIRMATION

## 2018-03-02 LAB — HEPATITIS B SURFACE ANTIGEN: HEPATITIS B SURFACE ANTIGEN: REACTIVE — CR

## 2018-03-02 LAB — HEPATITIS B SURFACE AB QUANT: HEPATITIS B SURFACE AB QUANT: <3.1 m[IU]/mL (ref 0.00–9.99)

## 2018-03-02 LAB — HELICOBACTER PYLORI STOOL AG: HELICOBACTER PYLORI STOOL ANTIGEN: POSITIVE

## 2018-03-02 LAB — HEPATITIS B CORE ANTIBODY: HEPATITIS B CORE ANTIBODY: REACTIVE — AB

## 2018-03-04 LAB — CHLAMYDIA GC NAAT
GENPROBE CHLAMYDIA: NEGATIVE
GENPROBE GC: NEGATIVE

## 2018-03-05 ENCOUNTER — Telehealth (HOSPITAL_BASED_OUTPATIENT_CLINIC_OR_DEPARTMENT_OTHER): Payer: Self-pay | Admitting: Family Medicine

## 2018-03-05 DIAGNOSIS — A048 Other specified bacterial intestinal infections: Secondary | ICD-10-CM | POA: Insufficient documentation

## 2018-03-05 DIAGNOSIS — R768 Other specified abnormal immunological findings in serum: Secondary | ICD-10-CM

## 2018-03-05 DIAGNOSIS — R7689 Other specified abnormal immunological findings in serum: Secondary | ICD-10-CM | POA: Insufficient documentation

## 2018-03-05 MED ORDER — CLARITHROMYCIN 500 MG PO TABS: 500 mg | tablet | Freq: Two times a day (BID) | ORAL | 0 refills | 0 days | Status: AC

## 2018-03-05 MED ORDER — AMOXICILLIN 500 MG PO CAPS
1000.0000 mg | ORAL_CAPSULE | Freq: Two times a day (BID) | ORAL | 0 refills | Status: AC
Start: 2018-03-05 — End: 2018-03-15

## 2018-03-05 MED ORDER — OMEPRAZOLE 20 MG PO CPDR: 20 mg | capsule | Freq: Two times a day (BID) | ORAL | 0 refills | 0 days | Status: DC

## 2018-03-05 MED ORDER — OMEPRAZOLE 20 MG PO CPDR
20.0000 mg | DELAYED_RELEASE_CAPSULE | Freq: Two times a day (BID) | ORAL | 0 refills | Status: DC
Start: 2018-03-05 — End: 2018-03-24

## 2018-03-05 MED ORDER — AMOXICILLIN 500 MG PO CAPS: 1000 mg | capsule | Freq: Two times a day (BID) | ORAL | 0 refills | 0 days | Status: AC

## 2018-03-05 MED ORDER — CLARITHROMYCIN 500 MG PO TABS
500.0000 mg | ORAL_TABLET | Freq: Two times a day (BID) | ORAL | 0 refills | Status: AC
Start: 2018-03-05 — End: 2018-03-15

## 2018-03-05 NOTE — Progress Notes (Signed)
Negative vaginitis testing  Released to Mychart

## 2018-03-05 NOTE — Progress Notes (Signed)
Pt informed by phone. See telephone encounter

## 2018-03-05 NOTE — Progress Notes (Signed)
Pt never took H pylori medications  Willing to take them now  Willing to stop fluticasone while she takes them and for 24 hours after due to potential interaction with clarithromycin    Pt will need TOC minimum 4 weeks after treatment    Neg GC and neg vaginitis panel    CMP normal  Hep B Core antibody and antigen positive  Hep C antibody positive    Only eye surgery was last summer  No sexual contact in 25 years   No hx IV drug use  No blood transmission  Does not remember sharing razor or toothbrush    Will check Hep B and C viral load, Hep A antibody, HIV, CBC and INR    If viral loads are positive will refer to ID given co-infection     Pt endorses understanding

## 2018-03-20 ENCOUNTER — Ambulatory Visit (HOSPITAL_BASED_OUTPATIENT_CLINIC_OR_DEPARTMENT_OTHER): Payer: Self-pay | Admitting: Family Medicine

## 2018-03-20 DIAGNOSIS — Z780 Asymptomatic menopausal state: Secondary | ICD-10-CM

## 2018-03-20 DIAGNOSIS — N898 Other specified noninflammatory disorders of vagina: Secondary | ICD-10-CM

## 2018-03-20 LAB — US PELVIC NON-OB W TRANSVAG, 3D

## 2018-03-20 NOTE — Addendum Note (Signed)
Addended byVenetia Maxon on: 03/20/2018 10:30 AM     Modules accepted: Orders

## 2018-03-21 ENCOUNTER — Encounter (HOSPITAL_BASED_OUTPATIENT_CLINIC_OR_DEPARTMENT_OTHER): Payer: Self-pay | Admitting: Medical

## 2018-03-21 ENCOUNTER — Telehealth (HOSPITAL_BASED_OUTPATIENT_CLINIC_OR_DEPARTMENT_OTHER): Payer: Self-pay | Admitting: Medical

## 2018-03-21 ENCOUNTER — Telehealth (HOSPITAL_BASED_OUTPATIENT_CLINIC_OR_DEPARTMENT_OTHER): Payer: Self-pay | Admitting: Obstetrics & Gynecology

## 2018-03-21 DIAGNOSIS — N950 Postmenopausal bleeding: Secondary | ICD-10-CM | POA: Insufficient documentation

## 2018-03-21 NOTE — Progress Notes (Signed)
Spoke with patient regarding [place] results   - thickened endometrium, recommend EMB. Has Gyn appt next week    Pt states last bleeding was yesterday, wondering if they can do EMB under anesthesia because she has a low pain tolerance and remembers a lot of pain when she had this done 5 y ago in [place]. Discussed typically only NSAIDs used beforehand, but she can discuss her concerns with gyn next week.  Patient verified understanding and all questions were answered.  [address] [place], [place], 03/21/2018

## 2018-03-21 NOTE — Progress Notes (Signed)
03/21/18 [name] from [facility] called to schedule referral appointment for Post-Menopausal Bleeding , pt has appointment on 03/26/18 @4 :00pm @ [facility]-pt had U/S done on 03/20/18-[name] will reach out to pt regarding her appointment info-tc

## 2018-03-21 NOTE — Progress Notes (Signed)
Spoke with patient, see telephone encounter.  

## 2018-03-24 ENCOUNTER — Other Ambulatory Visit (HOSPITAL_BASED_OUTPATIENT_CLINIC_OR_DEPARTMENT_OTHER): Payer: Self-pay | Admitting: Family Medicine

## 2018-03-25 NOTE — Progress Notes (Signed)
PER Pharmacy, Victoria Macdonald is a 72 year old female has requested a refill of omeprazole.       Last Office Visit: 03/01/18 with Barkley Bruns       Last Physical Exam: 09/27/17    There are no preventive care reminders to display for this patient.    Other Med Adult:  Most Recent BP Reading(s)  03/01/18 : (!) 144/72        Cholesterol (mg/dL)   Date Value   03/04/2015 235     LOW DENSITY LIPOPROTEIN DIRECT (mg/dL)   Date Value   03/04/2015 171     HIGH DENSITY LIPOPROTEIN (mg/dL)   Date Value   03/04/2015 45     No results found for: TG      No results found for: TSHSC      No results found for: TSH    No results found for: HGBA1C    No results found for: POCA1C      No results found for: INR    SODIUM (mmol/L)   Date Value   03/01/2018 143       POTASSIUM (mmol/L)   Date Value   03/01/2018 4.4           CREATININE (mg/dL)   Date Value   03/01/2018 0.8       Documented patient preferred pharmacies:    [id number] [address], [place] - [place]  Phone: [phone] Fax: [phone]

## 2018-03-26 ENCOUNTER — Ambulatory Visit (HOSPITAL_BASED_OUTPATIENT_CLINIC_OR_DEPARTMENT_OTHER): Payer: Medicare (Managed Care) | Admitting: Obstetrics & Gynecology

## 2018-03-26 ENCOUNTER — Ambulatory Visit: Payer: Self-pay | Admitting: Family Medicine

## 2018-03-26 VITALS — BP 116/73 | HR 82 | Wt 134.0 lb

## 2018-03-26 DIAGNOSIS — N950 Postmenopausal bleeding: Secondary | ICD-10-CM

## 2018-03-29 ENCOUNTER — Encounter (HOSPITAL_BASED_OUTPATIENT_CLINIC_OR_DEPARTMENT_OTHER): Payer: Self-pay | Admitting: Obstetrics & Gynecology

## 2018-03-29 NOTE — Progress Notes (Signed)
Victoria Macdonald is a 72 yo here with H/O brown DC that she noticed on her pad that she started wearing the past few months for urinary incontinence. She had a pelvic US 5/ 29 that showed a small uterus and 77mm stripe. She has stopped using a pad and has had no further DC. She had a similar problem in 2010 in [place] had an [place] with 2 mm stripe and a bx that was quite painful. She has had no bloating or abdominal pain.     Past Medical History:  No date: Back pain  05/13/2015: LLQ pain     Past Surgical History:  ~1985: ABDOMINAL SURGERY  ~1975: TUBAL LIGATION     Allergies NKDA    Review of Systems  Constitutional:Deneis fever, chills, weight loss/ weight gain  HO:OILNZV nausea, vomiting, constipation, diarrhea  GYN: denies irregular menses, abnormal vaginal discharge, pelvic pain,GU: denies dysuria, frequency, hematuria    Exam:  General: WDWN NAD   Abdomen: soft non tender, no masses, normal bowel sounds  GYN: NEFG, no external lesions, atrophic, bartholins glands without masses, , urethral meatus normal location, no prolapse, urethra no masses, midline, no cystocele noted, vagina no lesions,atrophic, no abnormal DC, cervix no lesions, no CMT, uterus small AV mobile, smooth , adnexa no masses, tenderness or nodularity, bladder non tender    A/P Discussed exam is c/w genital atrophy that can cause bleeding, but that [place] stripe is  6 mm and would recommend a bx to be 100% sure. She really does not want to have a bx done. We discussed that her stripe is >57mm so can not assure her that she has no intrauterine pathology. She has agreed to call if she has ANY brown DC or vaginal bleeding for D&C.

## 2018-04-30 ENCOUNTER — Ambulatory Visit
Admission: RE | Admit: 2018-04-30 | Discharge: 2018-04-30 | Disposition: A | Discharge status: Home / Self Care | Payer: No Typology Code available for payment source | Attending: Family Medicine | Admitting: Family Medicine

## 2018-04-30 ENCOUNTER — Encounter (HOSPITAL_BASED_OUTPATIENT_CLINIC_OR_DEPARTMENT_OTHER): Payer: Self-pay | Admitting: Family Medicine

## 2018-04-30 ENCOUNTER — Ambulatory Visit (HOSPITAL_BASED_OUTPATIENT_CLINIC_OR_DEPARTMENT_OTHER): Payer: No Typology Code available for payment source | Admitting: Family Medicine

## 2018-04-30 ENCOUNTER — Other Ambulatory Visit (HOSPITAL_BASED_OUTPATIENT_CLINIC_OR_DEPARTMENT_OTHER): Payer: Self-pay | Admitting: Family Medicine

## 2018-04-30 VITALS — BP 104/70 | HR 93 | Temp 98.0°F | Wt 135.0 lb

## 2018-04-30 DIAGNOSIS — R768 Other specified abnormal immunological findings in serum: Secondary | ICD-10-CM | POA: Diagnosis present

## 2018-04-30 DIAGNOSIS — A048 Other specified bacterial intestinal infections: Secondary | ICD-10-CM | POA: Diagnosis present

## 2018-04-30 DIAGNOSIS — M25551 Pain in right hip: Secondary | ICD-10-CM | POA: Diagnosis not present

## 2018-04-30 DIAGNOSIS — M810 Age-related osteoporosis without current pathological fracture: Secondary | ICD-10-CM | POA: Diagnosis present

## 2018-04-30 DIAGNOSIS — Z1231 Encounter for screening mammogram for malignant neoplasm of breast: Secondary | ICD-10-CM

## 2018-04-30 DIAGNOSIS — R0981 Nasal congestion: Secondary | ICD-10-CM | POA: Insufficient documentation

## 2018-04-30 DIAGNOSIS — Z789 Other specified health status: Secondary | ICD-10-CM

## 2018-04-30 DIAGNOSIS — Z0100 Encounter for examination of eyes and vision without abnormal findings: Secondary | ICD-10-CM

## 2018-04-30 DIAGNOSIS — Z1239 Encounter for other screening for malignant neoplasm of breast: Secondary | ICD-10-CM

## 2018-04-30 LAB — CBC WITH PLATELET
HEMATOCRIT: 41.9 % (ref 34.1–44.9)
HEMOGLOBIN: 14.1 g/dL (ref 11.2–15.7)
MEAN CORP HGB CONC: 33.7 g/dL (ref 31.0–37.0)
MEAN CORPUSCULAR HGB: 29.1 pg (ref 26.0–34.0)
MEAN CORPUSCULAR VOL: 86.6 fL (ref 80.0–100.0)
MEAN PLATELET VOLUME: 11.8 fL (ref 8.7–12.5)
PLATELET COUNT: 143 10*3/uL — ABNORMAL LOW (ref 150–400)
RBC DISTRIBUTION WIDTH STD DEV: 43.3 fL (ref 35.1–46.3)
RBC DISTRIBUTION WIDTH: 13.6 % (ref 11.5–14.3)
RED BLOOD CELL COUNT: 4.84 M/uL (ref 3.90–5.20)
WHITE BLOOD CELL COUNT: 6.3 10*3/uL (ref 4.0–11.0)

## 2018-04-30 LAB — PROTHROMBIN TIME
INR: 1 (ref 2.0–3.5)
PROTHROMBIN TIME: 11.4 SECONDS (ref 9.6–12.3)

## 2018-04-30 MED ORDER — ALENDRONATE SODIUM 70 MG PO TABS: tablet | ORAL | 11 refills | 0 days | Status: AC

## 2018-04-30 MED ORDER — MONTELUKAST SODIUM 10 MG PO TABS: 10 mg | tablet | Freq: Every evening | ORAL | 2 refills | 0 days | Status: DC

## 2018-04-30 MED ORDER — MONTELUKAST SODIUM 10 MG PO TABS
10.0000 mg | ORAL_TABLET | Freq: Every evening | ORAL | 2 refills | Status: DC
Start: 2018-04-30 — End: 2018-07-22

## 2018-04-30 MED ORDER — ALENDRONATE SODIUM 70 MG PO TABS
ORAL_TABLET | ORAL | 11 refills | Status: DC
Start: 2018-04-30 — End: 2019-04-17

## 2018-04-30 NOTE — Progress Notes (Signed)
Family Medicine Office Note    CC:  Patient presents with:  Labs  Hip Pain      HPI:    R hip pain:  Worse with sitting and standing  Improves with walking  No trauma  Not too worried  Meloxicam helps     Bilateral knees:  Pain in both knee- alternate pain  No trauma  L knee pain for a long time with swelling in [place]  Not too worried   Meloxicam helps    Vaginal bleeding:  Seen by gyn. Discussed striped   Stopped the pads incase it was irritating  Plan to do EMB if recurs    H pylori:  Did complete oral tx  GI symptoms are better but mostly got better after stopping citalopram  Not sure there is much difference now    Nasal congestion:  Chronic  Using allegra and nasal steroids  Has not and would not sure nasal saline    Hep C and B:  Pt has not be told this before  No hx of drug use or new sexual partners    Hx of osteoporosis:  Was on alendronate   Rx ran out and pt has not restarted  Pt was on alendronate for a couple of years  Last bone density in 2016    Eye sx:  Pt had cataract surgery and eye lift last year  Having chronic irritation around eyes and concerned about cataracts  Would like an evaluation by a different provider    Family hx of cancer    EXAM:  BP 104/70 (Site: LA, Position: Sitting, Cuff Size: Reg)  Pulse 93  Temp 98 F (36.7 C) (Temporal)  Wt 61.2 kg (135 lb)  LMP  (LMP Unknown)  SpO2 98%  BMI 25.51 kg/m2  GEN: Well appearing, NAD  Hip exam - both sides-full range of motion, no effusion, masses, contracture or deformity noted. R hip with inguinal pain with flexion, internal rotation and palpation  MOOD: Normal thought content, speech, affect, mood and dress are noted.    ASSESSMENT/PLAN:    Victoria Macdonald is a 72 year old female who presents for    1. Right hip pain  Xray ordered given inguinal pain to evaluation for alignment/arthritis  Can continue routine exercise and pain management    2. HCV antibody positive  No recent exposure. Likely chronic or cleared. Will check additional labs  -  PROTHROMBIN TIME  - HEPATITIS C VIRUS GENOTYPING  - HEPATITIS C PCR QUANTITATIVE  - HEPATITIS A ANTIBODY TOTAL  - HIV ANTIGEN ANTIBODY 5TH GEN  - CBC WITH PLATELET  - COLLECTION VENOUS BLOOD VENIPUNCTURE    3. Hepatitis B surface antigen positive  No recent exposure know. Likely chronic or cleared. Will check additional labs  If viral load present, will obtain US and referral to ID  - PROTHROMBIN TIME  - HEPATITIS B PCR QUANT  - HEPATITIS A ANTIBODY TOTAL  - HIV ANTIGEN ANTIBODY 5TH GEN  - CBC WITH PLATELET  - COLLECTION VENOUS BLOOD VENIPUNCTURE    4. H. pylori infection  Tx. TOC  - HELICOBACTER PYLORI STOOL AG; Future    5. Age-related osteoporosis without current pathological fracture  Restart alendronate. Reviewed SI including GI upset. Pt aware of proper taking technique  - alendronate (FOSAMAX) 70 MG tablet; with full glass of water on empty stomach-nothing else by mouth and do not lie down for 1/2 hour  Dispense: 4 tablet; Refill: 11  6. Screening for malignant neoplasm of breast  - [place] SCREENING MAMMO BILATERAL WITH CAD; Future    7. Nasal congestion  Chronic and not improved with PO antihistamine and nasal steroid. Trial of montelukast.   - montelukast (SINGULAIR) 10 MG tablet; Take 1 tablet by mouth nightly  Dispense: 30 tablet; Refill: 2    8. Eye exam, routine  Pt had cataract surgery and is concerned about eye irritation and need for glasses now  - REFERRAL TO OPHTHALMOLOGY ( INT)    9. Patient does not have healthcare proxy  Pt is still considering- took form home      **AVS given and patient instructions reviewed with patient, who had the opportunity to ask questions. The pt verbalizes and demonstrated understanding. Advised to call/RTC with concerns.

## 2018-05-01 LAB — HIV ANTIGEN ANTIBODY 5TH GEN: HIV 1/2 PLUS O AG/AB SCREEN: 0.23 INDEX (ref 0.00–0.99)

## 2018-05-02 ENCOUNTER — Encounter (HOSPITAL_BASED_OUTPATIENT_CLINIC_OR_DEPARTMENT_OTHER): Payer: Self-pay | Admitting: Family Medicine

## 2018-05-02 NOTE — Progress Notes (Signed)
Pt informed of results by MyChart

## 2018-05-03 ENCOUNTER — Encounter (HOSPITAL_BASED_OUTPATIENT_CLINIC_OR_DEPARTMENT_OTHER): Payer: Self-pay | Admitting: Family Medicine

## 2018-05-03 DIAGNOSIS — J310 Chronic rhinitis: Secondary | ICD-10-CM

## 2018-05-03 LAB — HEPATITIS B PCR QUANT
HEPATITIS B PCR IU/ML: <20 IU/ML — ABNORMAL HIGH
HEPATITIS B PCR LOG10: <1.301 LOG10

## 2018-05-03 LAB — HEPATITIS C PCR QUANT: HEPATITIS C PCR IU/ML: 0 IU/ml

## 2018-05-03 LAB — HEPATITIS A ANTIBODY TOTAL: HEPATITIS A ANTIBODY TOTAL: REACTIVE — AB

## 2018-05-03 MED ORDER — FLUTICASONE PROPIONATE 50 MCG/ACT NA SUSP
1.0000 | Freq: Every day | NASAL | 3 refills | Status: DC
Start: 2018-05-03 — End: 2019-07-31

## 2018-05-03 MED ORDER — FLUTICASONE PROPIONATE 50 MCG/ACT NA SUSP: 1 | Bottle | Freq: Every day | NASAL | 3 refills | 0 days | Status: AC

## 2018-05-03 NOTE — Telephone Encounter (Signed)
Message from [place]:   From: DOS SANTOS, Jerlyn G   Sent: Fri May 03, 2018 12:28 PM   To: Centralized Refill Pool  Subject: Medication [place] would like a refill of the following medications:   Other - I would like to be a 90 days instead of every month, thank you     [id number] [place] [place] [address]      Medication renewals requested in this message routed separately:     fluticasone (FLONASE) 50 MCG/ACT nasal spray Fredirick Lathe, MD]

## 2018-05-03 NOTE — Telephone Encounter (Signed)
PER Pharmacy, Victoria Macdonald is a 72 year old female has requested a refill of fluticasone.    Patient is requesting a 90 day supply      Last Office Visit: 04/30/2018 with D'agata, C  Last Physical Exam: 09/27/2017    There are no preventive care reminders to display for this patient.    Other Med Adult:  Most Recent BP Reading(s)  04/30/18 : 104/70        Cholesterol (mg/dL)   Date Value   03/04/2015 235     LOW DENSITY LIPOPROTEIN DIRECT (mg/dL)   Date Value   03/04/2015 171     HIGH DENSITY LIPOPROTEIN (mg/dL)   Date Value   03/04/2015 45     No results found for: TG      No results found for: TSHSC      No results found for: TSH    No results found for: HGBA1C    No results found for: POCA1C      INR (no units)   Date Value   04/30/2018 1.0       SODIUM (mmol/L)   Date Value   03/01/2018 143       POTASSIUM (mmol/L)   Date Value   03/01/2018 4.4           CREATININE (mg/dL)   Date Value   03/01/2018 0.8       Documented patient preferred pharmacies:    [id number] [address], [place]  Phone: [phone] Fax: [phone]

## 2018-05-03 NOTE — Telephone Encounter (Signed)
*  please note patient requesting 90 days supply*    PER Patient (self), Victoria Macdonald is a 72 year old female has requested a refill of flonase.      Last Office Visit: 04/30/2018 with D'Agata, C  Last Physical Exam: 09/27/2017    There are no preventive care reminders to display for this patient.    Other Med Adult:  Most Recent BP Reading(s)  04/30/18 : 104/70        Cholesterol (mg/dL)   Date Value   03/04/2015 235     LOW DENSITY LIPOPROTEIN DIRECT (mg/dL)   Date Value   03/04/2015 171     HIGH DENSITY LIPOPROTEIN (mg/dL)   Date Value   03/04/2015 45     No results found for: TG      No results found for: TSHSC      No results found for: TSH    No results found for: HGBA1C    No results found for: POCA1C      INR (no units)   Date Value   04/30/2018 1.0       SODIUM (mmol/L)   Date Value   03/01/2018 143       POTASSIUM (mmol/L)   Date Value   03/01/2018 4.4           CREATININE (mg/dL)   Date Value   03/01/2018 0.8       Documented patient preferred pharmacies:    [id number] [address], [place]  Phone: [phone] Fax: [place], [place] - [address]  Phone: [phone] Fax: [phone]

## 2018-05-06 ENCOUNTER — Encounter (HOSPITAL_BASED_OUTPATIENT_CLINIC_OR_DEPARTMENT_OTHER): Payer: Self-pay | Admitting: Family Medicine

## 2018-05-06 DIAGNOSIS — R768 Other specified abnormal immunological findings in serum: Secondary | ICD-10-CM

## 2018-05-06 DIAGNOSIS — R7689 Other specified abnormal immunological findings in serum: Secondary | ICD-10-CM

## 2018-05-06 NOTE — Addendum Note (Signed)
Addended by: Fredirick Lathe on: 05/06/2018 11:11 AM     Modules accepted: Orders

## 2018-05-06 NOTE — Progress Notes (Signed)
Pt informed of results by MyChart

## 2018-05-07 LAB — HEPATITIS C VIRUS GENOTYPING

## 2018-05-10 ENCOUNTER — Encounter (HOSPITAL_BASED_OUTPATIENT_CLINIC_OR_DEPARTMENT_OTHER): Payer: Self-pay | Admitting: Family Medicine

## 2018-05-10 LAB — HEPATITIS B E ANTIBODY: HEPATITIS B E ANTIBODY: POSITIVE — AB

## 2018-05-10 LAB — HEPATITIS D TOTAL ANTIBODY: HEPATITIS D TOTAL ANTIBODY: NEGATIVE

## 2018-05-10 LAB — HEPATITIS B E ANTIGEN: HEPATITIS B E ANTIGEN: NEGATIVE

## 2018-05-10 NOTE — Progress Notes (Signed)
Pt informed of results by MyChart

## 2018-05-28 ENCOUNTER — Other Ambulatory Visit (HOSPITAL_BASED_OUTPATIENT_CLINIC_OR_DEPARTMENT_OTHER): Payer: Self-pay | Admitting: Medical

## 2018-05-28 DIAGNOSIS — M25512 Pain in left shoulder: Principal | ICD-10-CM

## 2018-05-28 DIAGNOSIS — G8929 Other chronic pain: Secondary | ICD-10-CM

## 2018-05-28 NOTE — Progress Notes (Unsigned)
PER Pharmacy, Victoria Macdonald is a 72 year old female has requested a refill of Diclofenac.    Last Office Visit: 04/30/2018 with D'Agata, C  Last Physical Exam: 09/27/2017   There are no preventive care reminders to display for this patient.     Other Med Adult:  Most Recent BP Reading(s)  04/30/18 : 104/70        Cholesterol (mg/dL)   Date Value   03/04/2015 235     LOW DENSITY LIPOPROTEIN DIRECT (mg/dL)   Date Value   03/04/2015 171     HIGH DENSITY LIPOPROTEIN (mg/dL)   Date Value   03/04/2015 45     No results found for: TG      No results found for: TSHSC      No results found for: TSH    No results found for: HGBA1C    No results found for: POCA1C      INR (no units)   Date Value   04/30/2018 1.0       SODIUM (mmol/L)   Date Value   03/01/2018 143       POTASSIUM (mmol/L)   Date Value   03/01/2018 4.4           CREATININE (mg/dL)   Date Value   03/01/2018 0.8        Documented patient preferred pharmacies:

## 2018-06-05 ENCOUNTER — Ambulatory Visit
Payer: No Typology Code available for payment source | Attending: Obstetrics & Gynecology | Admitting: Obstetrics & Gynecology

## 2018-06-05 ENCOUNTER — Encounter (HOSPITAL_BASED_OUTPATIENT_CLINIC_OR_DEPARTMENT_OTHER): Payer: Self-pay

## 2018-06-05 ENCOUNTER — Ambulatory Visit
Admission: RE | Admit: 2018-06-05 | Discharge: 2018-06-05 | Disposition: A | Discharge status: Home / Self Care | Payer: No Typology Code available for payment source | Attending: Family Medicine | Admitting: Family Medicine

## 2018-06-05 VITALS — BP 119/71 | HR 80 | Wt 138.0 lb

## 2018-06-05 DIAGNOSIS — Z1231 Encounter for screening mammogram for malignant neoplasm of breast: Secondary | ICD-10-CM | POA: Diagnosis present

## 2018-06-05 DIAGNOSIS — N950 Postmenopausal bleeding: Secondary | ICD-10-CM | POA: Diagnosis not present

## 2018-06-05 DIAGNOSIS — Z1239 Encounter for other screening for malignant neoplasm of breast: Secondary | ICD-10-CM

## 2018-06-05 NOTE — Progress Notes (Signed)
Victoria Macdonald is a 72 yo here with H/O brown DC that she noticed on her pad that she started wearing the past few months for urinary incontinence. She had a pelvic US 5/ 29 that showed a small uterus and 38mm stripe. She declined biopsy/ D&C at her last visit and opted for observation instead. Since her last visit she has had some yellow DC and some pinkish DC. She reports that she notices this mostly when she has been standing a long time or is lifting.     A/P Discussed need for tissue sample. She agrees to hysteroscopy D&C. Will schedule and see her for pre-op visit.15 min visit >50% of time in face to face counseling and/or coordination of care.

## 2018-06-06 DIAGNOSIS — Z1231 Encounter for screening mammogram for malignant neoplasm of breast: Secondary | ICD-10-CM

## 2018-06-10 ENCOUNTER — Ambulatory Visit: Payer: No Typology Code available for payment source | Attending: Family Medicine | Admitting: Internal Medicine

## 2018-06-10 VITALS — BP 140/74 | HR 80 | Temp 96.4°F | Wt 140.0 lb

## 2018-06-10 DIAGNOSIS — R768 Other specified abnormal immunological findings in serum: Secondary | ICD-10-CM | POA: Insufficient documentation

## 2018-06-10 DIAGNOSIS — D696 Thrombocytopenia, unspecified: Secondary | ICD-10-CM | POA: Diagnosis present

## 2018-06-10 DIAGNOSIS — R7689 Other specified abnormal immunological findings in serum: Secondary | ICD-10-CM

## 2018-06-10 NOTE — Progress Notes (Signed)
States feeling well Denies pain.Safe at home.

## 2018-06-10 NOTE — Progress Notes (Signed)
Dr.D'Agata,    Thank you for requesting an ID consult on Victoria Macdonald regarding HBV. He was diagnosed with HBV in 2019 upon routine screening.      Antigen/Antibody status  SurfaceAg +, Surface Ab neg, CoreAb +, eAg neg, eAb +, HBV DNA <20.     Longstanding mild thrombocytopenia  PLATELET COUNT (TH/uL)   Date Value   04/30/2018 143 (L)   02/09/2016 166   04/01/2015 157     INR (no units)   Date Value   04/30/2018 1.0     No US done in past, no liver issues mentioned in 2016 CT    HIV negative as 04/2018    [name]: Reactive  HCV Status: Antibody +, VL zero    ROS  No icterus  No current N/V abd pain generally  No CP, SOB, cough  No rash  No neuropathy  No fever  All other ROS negative    Social  From [place], moved 60 years prior  Lives alone, recently divorced  Sexually active not for 25 years  Has children: 7 yo, son, 50-something daughter, born and lives in [place], Adopted son 54, lives in [place]. All vaginal deliveries, had GYN surgery (tubal ligation).   ETOH- No  Smoke- 5-8 ciggs/day  Work- Retired, worked in an [employer]  No known HBV, liver cancer, liver issues    Physical Exam: BP 140/74  Pulse 80  Temp 96.4 F (35.8 C)  Wt 63.5 kg (140 lb)  LMP  (LMP Unknown)  SpO2 99%  BMI 26.45 kg/m2  Lungs- clear  Abd- soft, NT, no HSM  Ext- no edema  Skin - no acute rash  No focal neurologic deficits    LABS  ALANINE AMINOTRANSFERASE (U/L)   Date Value   03/01/2018 17   02/09/2016 19   04/01/2015 20     Results for DOS EMAGENE, MERFELD (MRN [id number]) as of 06/10/2018 10:59   Ref. Range 03/01/2018 11:58 04/30/2018 10:22   HEPATITIS A ANTIBODY TOTAL Latest Ref Range: NONREACTIVE   REACTIVE (A)   HEPATITIS B SURFACE AB QUANT Latest Ref Range: 0.00 - 9.99 mIU/mL < 3.10    HEPATITIS B E ANTIGEN Latest Ref Range: Negative   Negative   HEPATITIS B E ANTIBODY Latest Ref Range: Negative   Positive (A)   HEPATITIS B PCR IU/ML Latest Ref Range: Not Detected IU/ML  < 20 (H)    HEPATITIS B PCR LOG10 Latest Units: LOG10  < 1.301   HEPATITIS B CORE ANTIBODY Latest Ref Range: NONREACTIVE  REACTIVE (A)    HBSAG NEUTRALIZ CONFIRMATION Latest Ref Range: NOT CONFIRM  CONFIRMED (AA)    HEPATITIS B SURFACE ANTIGEN Latest Ref Range: NONREACTIVE  REACTIVE (AA)    HCV GENOTYPE PLEASE NOTE Latest Ref Range: .   SEE COMMENT   HEPATITIS C ANTIBODY Latest Ref Range: NONREACTIVE  REACTIVE (A)    HEPATITIS C PCR IU/ML Latest Ref Range: Not Detected IU/ml  0   HEPATITIS C PCR LOG10 Latest Units: LOG10  TNP   HEPATITIS C VIRUS GENOTYPE Latest Ref Range: .   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      Chronic HBV is a dynamic disease process where patients can transition  amongst stages of infection.       The natural history of disease in patients with eAg negative infection generally follows one of following three paths. However, some patient will occupy a gray zone, meaning their HBV DNA and ALT levels do not fall into the same phase. Longitudinal follow-up including measurement of ALT, HBV DNA levels and liver histology can help to clarify this.    At present, my assessment is that this patient likely fits into the category "Inactive HBV Carrier Phase"    1) "Inactive HBV Carrier Phase": ALT  <40 IU/mL (ideally <19 for women, <30 for men) and HBV DNA <2000IU/mL over time.  Overall, this phase is associated with a favourable long-term outcome in terms of cirrhosis and HCC risk because of tight, native immunological control. NOTE: significant liver disease can be found in HBeAg-negative patients with normal ALT, though rare of ALT is persistently normal over a 95-month period and HBV DNA <2000 international units/mL. In addition, these patients can reactivate their infection, transitioning to "HBeAg-negative Chronic HBV" (see below) .     2) "HBeAg-negative Chronic HBV" This state  is characterised by periodic reactivation with a pattern of fluctuating levels of HBV DNA and aminotransferases and active hepatitis (<19 for women, <30 for men). These patients have a higher risk of progression to advanced hepatic fibrosis, cirrhosis and subsequent complications (decompensated cirrhosis; [place]). These individuals often require suppressive treatment if persistently in this phase. This infection state is often associated by the replication of virus mutants (often called basal core promoters) that are incompetent in terms of eAg production but still perpetuate active infection.     3) Spontaneous clearance of HBSAg with seroconversion to anti-HBs antibody.   This may occur in 1-3% of cases per year. This generally occurs in individuals who are in the inactive carrier phase and have undetectable HBV DNA over several years. Associated with improved patient survival.    Because of the often unpredictable nature of HBV disease over the lifespan, ongoing monitoring is required.     Anticipatory Management:    In the future he/she may warrant suppressive treatment: This is a determination made based largely on the following 3 criteria:    1) Serum HBV DNA levels  2) Serum ALT levels  3) Severity of liver disease.       We discussed  1.  What HBV is and how it is contracted.    2. That there is no cure and lifelong monitoring is recommended.     3. Unclear route of transmission for her  4. That sexual transmission is possible.    5. All household members should also be check for HBV and vaccinated if indicated.  Discussed not sharing toothbrushes, nail clippers, or razors with those for whom the HBV status in not known to immune and not donating blood.    6. Concern for liver fibrosis, cirrhosis, HCC, liver cancer.    7. Importance of avoiding OTC supplementation without discussing with me first/Avoidance of OTC liver supplements purporting    Based on his current labs there is NO indication to start antiviral  therapy.     RECOMMENDATIONS:   - In order to establish a baseline and understand this patient's infection status, we will obtaining ALT and HBV DNA every 3-4 months for the coming year.   -Monitor thrombocytopenia, refer to GI if advanced fibrosis on [place]  - If they remain stable then q 6 mo monitoring is appropriate.    - q6  month RUQ ultrasound.     Franne Forts Harlon Flor, MD  Infectious Diseases Division

## 2018-06-17 ENCOUNTER — Other Ambulatory Visit (HOSPITAL_BASED_OUTPATIENT_CLINIC_OR_DEPARTMENT_OTHER): Payer: Self-pay | Admitting: Internal Medicine

## 2018-06-17 ENCOUNTER — Ambulatory Visit
Admission: RE | Admit: 2018-06-17 | Discharge: 2018-06-17 | Disposition: A | Discharge status: Home / Self Care | Payer: No Typology Code available for payment source | Attending: Internal Medicine | Admitting: Internal Medicine

## 2018-06-17 DIAGNOSIS — B1910 Unspecified viral hepatitis B without hepatic coma: Secondary | ICD-10-CM

## 2018-06-17 DIAGNOSIS — K7689 Other specified diseases of liver: Secondary | ICD-10-CM

## 2018-06-17 DIAGNOSIS — D734 Cyst of spleen: Secondary | ICD-10-CM | POA: Diagnosis not present

## 2018-06-17 DIAGNOSIS — R7689 Other specified abnormal immunological findings in serum: Secondary | ICD-10-CM

## 2018-06-17 DIAGNOSIS — R768 Other specified abnormal immunological findings in serum: Secondary | ICD-10-CM

## 2018-06-22 ENCOUNTER — Encounter (HOSPITAL_BASED_OUTPATIENT_CLINIC_OR_DEPARTMENT_OTHER): Payer: Self-pay | Admitting: Family Medicine

## 2018-07-09 ENCOUNTER — Telehealth (HOSPITAL_BASED_OUTPATIENT_CLINIC_OR_DEPARTMENT_OTHER): Payer: Self-pay | Admitting: Internal Medicine

## 2018-07-09 NOTE — Progress Notes (Signed)
Left VM that I was returning her call. Note sent to RN pool that if she calls back please report --[place] with some liver scarring, which we expected, but no tumors. Follow-up in clinic 11/25, nothing else to do at this time.

## 2018-07-09 NOTE — Telephone Encounter (Signed)
-----   Message from Delight Stare, RN sent at 07/08/2018  2:34 PM EDT -----  Regarding: FW: Dr. Vonzell Schlatter patient      ----- Message -----  From: Norva Pavlov  Sent: 07/08/2018   2:28 PM  To: Ems Nurses Pool  Subject: Dr. Vonzell Schlatter patient                            [place] [id number], 72 year old, female, Telephone Information:  Home Phone      [phone]  Work Phone      [phone]  Mobile          [phone]      Patient's Preferred Pharmacy:     [id number] [place], [place]  Phone: [phone] Fax: [phone]    [facility] [place], [place]  Phone: [phone] Fax: [phone]      CONFIRMED TODAY: Christene Lye NUMBER:  [phone]  Best time to call back:  anytime  Cell phone:   Other phone:    Available times:    Patient's language of care: English    Patient does not need an interpreter.    Patient's PCP: Fredirick Lathe, MD    Person calling on behalf of patient: Patient (self)    Calls today to speak to nurse only.    Pt calling to find out results from her CT scan. Pls call.

## 2018-07-09 NOTE — Progress Notes (Signed)
Charlyne Mom, MD  Delight Stare, RN; P Ems Nurses Pool            Hi Everyone, Left message to call back. If she calls back, let her know that she has some liver scarring, which we expected, but no tumors. I will see her again in November for repeat blood work and we will repeat her [place] in early 2020.

## 2018-07-17 ENCOUNTER — Other Ambulatory Visit (HOSPITAL_BASED_OUTPATIENT_CLINIC_OR_DEPARTMENT_OTHER): Payer: Self-pay | Admitting: Family Medicine

## 2018-07-17 ENCOUNTER — Ambulatory Visit
Admission: RE | Admit: 2018-07-17 | Discharge: 2018-07-17 | Disposition: A | Discharge status: Home / Self Care | Payer: No Typology Code available for payment source | Attending: Family Medicine | Admitting: Family Medicine

## 2018-07-17 ENCOUNTER — Ambulatory Visit (HOSPITAL_BASED_OUTPATIENT_CLINIC_OR_DEPARTMENT_OTHER): Payer: No Typology Code available for payment source | Admitting: Obstetrics & Gynecology

## 2018-07-17 ENCOUNTER — Encounter (HOSPITAL_BASED_OUTPATIENT_CLINIC_OR_DEPARTMENT_OTHER): Payer: Self-pay

## 2018-07-17 ENCOUNTER — Encounter (HOSPITAL_BASED_OUTPATIENT_CLINIC_OR_DEPARTMENT_OTHER): Payer: Self-pay | Admitting: Obstetrics & Gynecology

## 2018-07-17 VITALS — BP 120/71 | HR 85 | Wt 138.0 lb

## 2018-07-17 DIAGNOSIS — K469 Unspecified abdominal hernia without obstruction or gangrene: Secondary | ICD-10-CM | POA: Diagnosis not present

## 2018-07-17 DIAGNOSIS — Z01818 Encounter for other preprocedural examination: Secondary | ICD-10-CM | POA: Diagnosis present

## 2018-07-17 DIAGNOSIS — N950 Postmenopausal bleeding: Secondary | ICD-10-CM

## 2018-07-17 LAB — TYPE AND SCREEN
ABO/RH INTERPRETATION: B POS
ANTIBODY SCREEN SOLID PHASE: NEGATIVE

## 2018-07-17 NOTE — Progress Notes (Signed)
Reported to gyn provider about abdominal hernia and interest in evaluation  Referral placed

## 2018-07-17 NOTE — Discharge Instructions (Signed)
SURGICAL DAY CARE PRE-OPERATIVE INSTRUCTIONS    DAY OF SURGERY    Arrive at: [facility] Registration on Tuesday, October  8 at (Time): 10:00am      No aspirin ,ibuprofen for 1 week before surgery,  Tylenol is OK.     You must have a responsible adult available to accompany you home after surgery. (We suggest that you have someone available to assist you at home after your surgery).    INSTRUCTIONS:      You may have nothing to eat or drink after midnight the night before your surgery, not even water, gum or hard candy.      You may not smoke the morning of your surgery.     Please leave all valuables at home, including jewelry, watches, money, etc.     Please remove all fingernail polish before arriving at Surgical Day Care and do not  wear any face or lip make-up.     Do not shave surgical site.     Do not wear contact lenses.    MEDICATIONS:     Take the following medication the night before surgery at bedtime:     usual medicines    Take the following medication the morning of your surgery with only a sip of water:       flonase      And ranitidine

## 2018-07-17 NOTE — Surgery Pre-Op (Signed)
PAT Visit          Victoria Macdonald is a 72 year old female received a preoperative screening.        Appointments for Next 30 Days 07/17/2018 - 08/16/2018      Date Visit Type Department Provider     08/14/2018 11:30 AM [place], MD    Patient Instructions:                   08/16/2018  9:00 AM OFF Lynn II, MD    Patient Instructions:                    Surgery Information     Future Procedures (Tomorrow to 07/17/2019)       Date Time Procedures Providers Location Status        07/30/2018 1135 Hysteroscopy, With Dilation And Curettage Of Uterus [facility] [place] Scheduled Open case     Procedures: Hysteroscopy, With Dilation And Curettage Of Uterus                                .     Language of care:English    Allergy History  Lactose Intolerance (Gi)      Past Medical History:  No date: Back pain  05/13/2015: LLQ pain     has a past surgical history that includes Abdominal surgery (~0174) and Tubal ligation (~9449).         Alcohol, Tobacco and Drug History:  Social History    Tobacco Use      Smoking status: Current Every Day Smoker        Packs/day: 0.25        Years: 53.00        Pack years: 13.25      Smokeless tobacco: Never Used      Tobacco comment: currently smoking 5/day    E-Cigarettes  E-Cigarette Use:   Start Date:    Cartridges/Day:   Quit Date:       Alcohol use No       Drug use: No       @ECIGH @        Current Outpatient Medications   Medication Sig    fluticasone (FLONASE) 50 MCG/ACT nasal spray 1 spray by Each Nostril route daily    alendronate (FOSAMAX) 70 MG tablet with full glass of water on empty stomach-nothing else by mouth and do not lie down for 1/2 hour    montelukast (SINGULAIR) 10 MG tablet Take 1 tablet by mouth nightly    meloxicam (MOBIC) 15 MG tablet Take 1 tablet by mouth daily as needed for Pain    artificial tears (LUBRIFRESH P.M.) OINT Apply to eye     raNITIdine (ZANTAC) 150 MG tablet TAKE 1 TABLET BY MOUTH TWICE A DAY     No current facility-administered medications for this encounter.            (Not in a hospital admission)    PAT Assessment    Airways WDL     Activity tolerance (How many flights of stairs):  no sob or cp with stairs      Assistive Device:  None          PAT Screening     Row Name 07/17/18 1245  Integumentary    Integumentary (WDL)  WDL    Row Name 07/17/18 1245       Get Up And Go Test     * Single Movement- No  Loss of Balance  0    * Push Ups, Successful In One Attempt  0    * Multiple Attempts, But Successful  0    * Unable to Rise Without Assistance  0    Total Fall Risk Score  0    Total Fall Risk Score  0    Row Name 07/17/18 1245       STOP-Bang Questionaire     Do you snore loudly (loud enough to be heard through closed doors or   your bed-partner elbows you for snoring at night?  0    Do you often feel Tired, fatigued, or Sleepy during the daytime (such as   falling asleep when driving)?  0    Has anyone observed you stop breathing, or choking/gasping during your   sleep?  0    Do you have, or are being treated for, High Blood Pressure?  0    Height  5' (1.524 m)    Weight  62.1 kg (137 lb)    BMI (Calculated)  26.81    Neck size large?  0    Stop-Bang Score  Low    Row Name 07/17/18 1220       STOP-Bang Questionaire    Height  5' (1.524 m)    Weight  62.1 kg (137 lb)    BMI (Calculated)  26.81    Row Name 07/17/18 1113       STOP-Bang Questionaire    Weight  62.6 kg (138 lb)                 07/17/18  1220 07/17/18  1245   BP: 103/71    Pulse: 90    Resp: 20    Temp: 97.6 F (36.4 C)    TempSrc: Temporal    SpO2: 98%    Weight: 62.1 kg (137 lb) 62.1 kg (137 lb)   Height: 5' (1.524 m) 5' (1.524 m)           Patient issues currently are hysteroscopy D+C.  Medication instructions were given. AVS given to patient.   CHX wipes instructions were given and reviewed.       Donalda Ewings, RN

## 2018-07-19 ENCOUNTER — Encounter (HOSPITAL_BASED_OUTPATIENT_CLINIC_OR_DEPARTMENT_OTHER): Payer: Self-pay | Admitting: Obstetrics & Gynecology

## 2018-07-22 ENCOUNTER — Other Ambulatory Visit (HOSPITAL_BASED_OUTPATIENT_CLINIC_OR_DEPARTMENT_OTHER): Payer: Self-pay | Admitting: Family Medicine

## 2018-07-22 DIAGNOSIS — R0981 Nasal congestion: Secondary | ICD-10-CM

## 2018-07-22 NOTE — Progress Notes (Signed)
PER Pharmacy, Victoria Macdonald is a 72 year old female has requested a refill of montelukast.      Last Office Visit: 04/30/2018  with pcp  Last Physical Exam: 09/27/2017    There are no preventive care reminders to display for this patient.    Other Med Adult:  Most Recent BP Reading(s)  07/17/18 : 103/71        Cholesterol (mg/dL)   Date Value   03/04/2015 235     LOW DENSITY LIPOPROTEIN DIRECT (mg/dL)   Date Value   03/04/2015 171     HIGH DENSITY LIPOPROTEIN (mg/dL)   Date Value   03/04/2015 45     No results found for: TG      No results found for: TSHSC      No results found for: TSH    No results found for: HGBA1C    No results found for: POCA1C      INR (no units)   Date Value   04/30/2018 1.0       SODIUM (mmol/L)   Date Value   03/01/2018 143       POTASSIUM (mmol/L)   Date Value   03/01/2018 4.4           CREATININE (mg/dL)   Date Value   03/01/2018 0.8       Documented patient preferred pharmacies:    [id number] [address], [place]  Phone: [phone] Fax: [phone]

## 2018-07-29 ENCOUNTER — Other Ambulatory Visit (HOSPITAL_BASED_OUTPATIENT_CLINIC_OR_DEPARTMENT_OTHER): Payer: Self-pay | Admitting: Family Medicine

## 2018-07-29 DIAGNOSIS — K219 Gastro-esophageal reflux disease without esophagitis: Secondary | ICD-10-CM

## 2018-07-29 MED ORDER — FAMOTIDINE 10 MG PO TABS: 10 mg | tablet | Freq: Two times a day (BID) | ORAL | 3 refills | 0 days | Status: DC

## 2018-07-29 MED ORDER — FAMOTIDINE 10 MG PO TABS
10.0000 mg | ORAL_TABLET | Freq: Two times a day (BID) | ORAL | 3 refills | Status: DC
Start: 2018-07-29 — End: 2018-07-30

## 2018-07-29 NOTE — H&P (View-Only) (Signed)
SUBJECTIVE:  Victoria Macdonald is a 72 year old female who is here for a preoperative physical.  She has an upcoming hysteroscopy D&C.She has H/O brown DC that she noticed on her pad that she started wearing the past few months for urinary incontinence. She had a pelvic US 5/ 29 that showed a small uterus and 64mm stripe. She declined biopsy/ D&C at her last visit and opted for observation instead. Since her last visit she has had some yellow DC and some pinkish DC. She reports that she notices this mostly when she has been standing a long time or is lifting. She has had no frank bleeding.     Obstetric History:  [id number]    Allergy History:  Lactose Intolerance (Gi)    Alcohol, Tobacco and Drug History:  Social History     Socioeconomic History    Marital status: Single     Spouse name: Not on file    Number of children: Not on file    Years of education: Not on file    Highest education level: Not on file   Occupational History    Not on file   Social Needs    Financial resource strain: Not on file    Food insecurity:     Worry: Not on file     Inability: Not on file    Transportation needs:     Medical: Not on file     Non-medical: Not on file   Tobacco Use    Smoking status: Current Every Day Smoker     Packs/day: 0.25     Years: 53.00     Pack years: 13.25    Smokeless tobacco: Never Used    Tobacco comment: currently smoking 5/day   Substance and Sexual Activity    Alcohol use: No     Alcohol/week: 0.0 oz    Drug use: No    Sexual activity: Not Currently   Lifestyle    Physical activity:     Days per week: Not on file     Minutes per session: Not on file    Stress: Not on file   Relationships    Social connections:     Talks on phone: Not on file     Gets together: Not on file     Attends religious service: Not on file     Active member of club or organization: Not on file     Attends meetings of clubs or organizations: Not on file     Relationship status: Not on file    Intimate partner  violence:     Fear of current or ex partner: Not on file     Emotionally abused: Not on file     Physically abused: Not on file     Forced sexual activity: Not on file   Other Topics Concern    Not on file   Social History Narrative    2018    Lives alone    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 Was eating shake in the past     Exercise- walking a little     Wears her seatbelt    Denies safety concerns    Faith is important to her      Current Outpatient Medications   Medication Sig    montelukast (SINGULAIR) 10  MG tablet TAKE 1 TABLET BY MOUTH EVERY DAY AT NIGHT    fluticasone (FLONASE) 50 MCG/ACT nasal spray 1 spray by Each Nostril route daily    alendronate (FOSAMAX) 70 MG tablet with full glass of water on empty stomach-nothing else by mouth and do not lie down for 1/2 hour    meloxicam (MOBIC) 15 MG tablet Take 1 tablet by mouth daily as needed for Pain    artificial tears (LUBRIFRESH P.M.) OINT Apply to eye    raNITIdine (ZANTAC) 150 MG tablet TAKE 1 TABLET BY MOUTH TWICE A DAY     No current facility-administered medications for this visit.        Past Medical History:  No date: Back pain  05/13/2015: LLQ pain    Past Surgical History:  ~1985: ABDOMINAL SURGERY  ~1975: TUBAL LIGATION    Review of patient's family history indicates:  Problem: Cancer - Other      Relation: Sister          Age of Onset: (Not Specified)          Comment: Uterus, ovarian, intestinal, liver  Problem: Glaucoma      Relation: Brother          Age of Onset: (Not Specified)  Problem: OTHER      Relation: Sister          Age of Onset: (Not Specified)          Comment: Dementia       Current problem list:  Patient Active Problem List:     Right low back pain     Tobacco use disorder     Sessile colonic polyp     Adjustment disorder with depressed mood     Osteoporosis     GAD (generalized anxiety  disorder)     Gastroesophageal reflux disease without esophagitis     Lung nodules     Adhesive capsulitis of left shoulder     Nasal congestion     HCV antibody positive     Hepatitis B surface antigen positive     H. pylori infection     Postmenopausal bleeding      REVIEW OF SYSTEMS:   Respiratory: negative   Cardiovascular: negative   Gastrointestinal: negative   Genitourinary: negative       OBJECTIVE:  Blood Pressure 120/71 (Site: LA, Position: Sitting, Cuff Size: Reg)  Pulse 85  Weight 62.6 kg (138 lb)  Last Menstrual Period  (LMP Unknown)  Body Mass Index 26.07 kg/m2  Pain Score: 0 (0/10)         General appearance: healthy, alert, well developed, well nourished  Lungs: Percussion normal. Good diaphragmatic excursion. Lungs clear to auscultation bilaterally  Heart: PMI normal. No lifts, heaves, or thrills. RRR. No murmurs, clicks, gallops or rubs  Abdomen: Abdomen soft, non-tender. BS normal. No masses, no organomegaly  Pelvic:03/2018 NEFG, no external lesions, atrophic, bartholins glands without masses, , urethral meatus normal location, no prolapse, urethra no masses, midline, no cystocele noted, vagina no lesions,atrophic, no abnormal DC, cervix no lesions, no CMT, uterus small AV mobile, smooth , adnexa no masses, tenderness or nodularity, bladder non tender      ASSESSMENT and PLAN:  Several episodes PMB with 6 mm stripe and "cysts in the subendomertium". The surgical procedure was reviewed with the patient in detail including risks of bleeding, infection, and damage to nearby organs. Benefits of the procedure were reviewed as well.  She is aware that she will  need to be NPO after midnight prior to surgery. We discussed the usual post operative course as well as post operative pain management. All questions answered, consents signed.     07/29/2018 9:33 AM       _____________________________________  Ilda Mori, MD   Pager: [phone]

## 2018-07-29 NOTE — Progress Notes (Unsigned)
Changed ranitidine to famotidine

## 2018-07-29 NOTE — H&P (Signed)
SUBJECTIVE:  Victoria Macdonald is a 72 year old female who is here for a preoperative physical.  She has an upcoming hysteroscopy D&C.She has H/O brown DC that she noticed on her pad that she started wearing the past few months for urinary incontinence. She had a pelvic US 5/ 29 that showed a small uterus and 55mm stripe. She declined biopsy/ D&C at her last visit and opted for observation instead. Since her last visit she has had some yellow DC and some pinkish DC. She reports that she notices this mostly when she has been standing a long time or is lifting. She has had no frank bleeding.     Obstetric History:  [id number]    Allergy History:  Lactose Intolerance (Gi)    Alcohol, Tobacco and Drug History:  Social History     Socioeconomic History    Marital status: Single     Spouse name: Not on file    Number of children: Not on file    Years of education: Not on file    Highest education level: Not on file   Occupational History    Not on file   Social Needs    Financial resource strain: Not on file    Food insecurity:     Worry: Not on file     Inability: Not on file    Transportation needs:     Medical: Not on file     Non-medical: Not on file   Tobacco Use    Smoking status: Current Every Day Smoker     Packs/day: 0.25     Years: 53.00     Pack years: 13.25    Smokeless tobacco: Never Used    Tobacco comment: currently smoking 5/day   Substance and Sexual Activity    Alcohol use: No     Alcohol/week: 0.0 oz    Drug use: No    Sexual activity: Not Currently   Lifestyle    Physical activity:     Days per week: Not on file     Minutes per session: Not on file    Stress: Not on file   Relationships    Social connections:     Talks on phone: Not on file     Gets together: Not on file     Attends religious service: Not on file     Active member of club or organization: Not on file     Attends meetings of clubs or organizations: Not on file     Relationship status: Not on file    Intimate partner  violence:     Fear of current or ex partner: Not on file     Emotionally abused: Not on file     Physically abused: Not on file     Forced sexual activity: Not on file   Other Topics Concern    Not on file   Social History Narrative    2018    Lives alone    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 Was eating shake in the past     Exercise- walking a little     Wears her seatbelt    Denies safety concerns    Faith is important to her      Current Outpatient Medications   Medication Sig    montelukast (SINGULAIR) 10  MG tablet TAKE 1 TABLET BY MOUTH EVERY DAY AT NIGHT    fluticasone (FLONASE) 50 MCG/ACT nasal spray 1 spray by Each Nostril route daily    alendronate (FOSAMAX) 70 MG tablet with full glass of water on empty stomach-nothing else by mouth and do not lie down for 1/2 hour    meloxicam (MOBIC) 15 MG tablet Take 1 tablet by mouth daily as needed for Pain    artificial tears (LUBRIFRESH P.M.) OINT Apply to eye    raNITIdine (ZANTAC) 150 MG tablet TAKE 1 TABLET BY MOUTH TWICE A DAY     No current facility-administered medications for this visit.        Past Medical History:  No date: Back pain  05/13/2015: LLQ pain    Past Surgical History:  ~1985: ABDOMINAL SURGERY  ~1975: TUBAL LIGATION    Review of patient's family history indicates:  Problem: Cancer - Other      Relation: Sister          Age of Onset: (Not Specified)          Comment: Uterus, ovarian, intestinal, liver  Problem: Glaucoma      Relation: Brother          Age of Onset: (Not Specified)  Problem: OTHER      Relation: Sister          Age of Onset: (Not Specified)          Comment: Dementia       Current problem list:  Patient Active Problem List:     Right low back pain     Tobacco use disorder     Sessile colonic polyp     Adjustment disorder with depressed mood     Osteoporosis     GAD (generalized anxiety  disorder)     Gastroesophageal reflux disease without esophagitis     Lung nodules     Adhesive capsulitis of left shoulder     Nasal congestion     HCV antibody positive     Hepatitis B surface antigen positive     H. pylori infection     Postmenopausal bleeding      REVIEW OF SYSTEMS:   Respiratory: negative   Cardiovascular: negative   Gastrointestinal: negative   Genitourinary: negative       OBJECTIVE:  Blood Pressure 120/71 (Site: LA, Position: Sitting, Cuff Size: Reg)  Pulse 85  Weight 62.6 kg (138 lb)  Last Menstrual Period  (LMP Unknown)  Body Mass Index 26.07 kg/m2  Pain Score: 0 (0/10)         General appearance: healthy, alert, well developed, well nourished  Lungs: Percussion normal. Good diaphragmatic excursion. Lungs clear to auscultation bilaterally  Heart: PMI normal. No lifts, heaves, or thrills. RRR. No murmurs, clicks, gallops or rubs  Abdomen: Abdomen soft, non-tender. BS normal. No masses, no organomegaly  Pelvic:03/2018 [facility], no external lesions, atrophic, bartholins glands without masses, , urethral meatus normal location, no prolapse, urethra no masses, midline, no cystocele noted, vagina no lesions,atrophic, no abnormal DC, cervix no lesions, no CMT, uterus small AV mobile, smooth , adnexa no masses, tenderness or nodularity, bladder non tender      ASSESSMENT and PLAN:  Several episodes PMB with 6 mm stripe and "cysts in the subendomertium". The surgical procedure was reviewed with the patient in detail including risks of bleeding, infection, and damage to nearby organs. Benefits of the procedure were reviewed as well.  She is aware that she will  need to be NPO after midnight prior to surgery. We discussed the usual post operative course as well as post operative pain management. All questions answered, consents signed.     07/29/2018 9:33 AM       _____________________________________  Ilda Mori, MD   Pager: [phone]

## 2018-07-29 NOTE — Progress Notes (Unsigned)
[facility], Victoria Macdonald is a 72 year old female has requested a refill of ranitidine 150.      Last Office Visit: 04-30-18 with pcp  Last Physical Exam: 09-27-17    There are no preventive care reminders to display for this patient.    Other Med Adult:  Most Recent BP Reading(s)  07/17/18 : 103/71        Cholesterol (mg/dL)   Date Value   03/04/2015 235     LOW DENSITY LIPOPROTEIN DIRECT (mg/dL)   Date Value   03/04/2015 171     HIGH DENSITY LIPOPROTEIN (mg/dL)   Date Value   03/04/2015 45     No results found for: TG      No results found for: TSHSC      No results found for: TSH    No results found for: HGBA1C    No results found for: POCA1C      INR (no units)   Date Value   04/30/2018 1.0       SODIUM (mmol/L)   Date Value   03/01/2018 143       POTASSIUM (mmol/L)   Date Value   03/01/2018 4.4           CREATININE (mg/dL)   Date Value   03/01/2018 0.8       Documented patient preferred pharmacies:    [id number] [address], [place] - [place]  Phone: [phone] Fax: [phone]

## 2018-07-29 NOTE — Progress Notes (Signed)
See H& P.

## 2018-07-30 ENCOUNTER — Encounter (HOSPITAL_BASED_OUTPATIENT_CLINIC_OR_DEPARTMENT_OTHER): Payer: Self-pay

## 2018-07-30 ENCOUNTER — Ambulatory Visit (HOSPITAL_BASED_OUTPATIENT_CLINIC_OR_DEPARTMENT_OTHER): Payer: No Typology Code available for payment source

## 2018-07-30 ENCOUNTER — Ambulatory Visit
Admission: RE | Admit: 2018-07-30 | Discharge: 2018-07-30 | Disposition: A | Discharge status: Home / Self Care | Payer: No Typology Code available for payment source | Attending: Obstetrics & Gynecology | Admitting: Obstetrics & Gynecology

## 2018-07-30 ENCOUNTER — Other Ambulatory Visit (HOSPITAL_BASED_OUTPATIENT_CLINIC_OR_DEPARTMENT_OTHER): Payer: Self-pay | Admitting: Obstetrics & Gynecology

## 2018-07-30 ENCOUNTER — Encounter (HOSPITAL_BASED_OUTPATIENT_CLINIC_OR_DEPARTMENT_OTHER)
Admission: RE | Disposition: A | Discharge status: Home / Self Care | Payer: Self-pay | Source: Ambulatory Visit | Attending: Obstetrics & Gynecology

## 2018-07-30 DIAGNOSIS — Z79899 Other long term (current) drug therapy: Secondary | ICD-10-CM | POA: Insufficient documentation

## 2018-07-30 DIAGNOSIS — N858 Other specified noninflammatory disorders of uterus: Secondary | ICD-10-CM

## 2018-07-30 DIAGNOSIS — N950 Postmenopausal bleeding: Secondary | ICD-10-CM | POA: Insufficient documentation

## 2018-07-30 DIAGNOSIS — N841 Polyp of cervix uteri: Secondary | ICD-10-CM

## 2018-07-30 DIAGNOSIS — K219 Gastro-esophageal reflux disease without esophagitis: Secondary | ICD-10-CM | POA: Diagnosis not present

## 2018-07-30 DIAGNOSIS — E7439 Other disorders of intestinal carbohydrate absorption: Secondary | ICD-10-CM | POA: Diagnosis not present

## 2018-07-30 DIAGNOSIS — F17210 Nicotine dependence, cigarettes, uncomplicated: Secondary | ICD-10-CM | POA: Diagnosis not present

## 2018-07-30 DIAGNOSIS — N854 Malposition of uterus: Secondary | ICD-10-CM

## 2018-07-30 SURGERY — HYSTEROSCOPY, WITH DILATION AND CURETTAGE OF UTERUS
Anesthesia: General | Site: Uterus | Wound class: Class II/ Clean Contaminated

## 2018-07-30 MED ORDER — FAMOTIDINE 20 MG PO TABS
20.0000 mg | ORAL_TABLET | Freq: Two times a day (BID) | ORAL | 3 refills | Status: DC | PRN
Start: 2018-07-30 — End: 2019-03-22

## 2018-07-30 MED ORDER — ONDANSETRON HCL 4 MG/2ML IJ SOLN
Freq: Once | INTRAMUSCULAR | Status: DC | PRN
Start: 2018-07-30 — End: 2018-07-30
  Administered 2018-07-30: 4 mg via INTRAVENOUS

## 2018-07-30 MED ORDER — SOD CITRATE-CITRIC ACID 500-334 MG/5ML PO SOLN
30.00 mL | Freq: Once | ORAL | Status: AC
Start: 2018-07-30 — End: 2018-07-30
  Administered 2018-07-30: 30 mL via ORAL
  Filled 2018-07-30: qty 30

## 2018-07-30 MED ORDER — ACETAMINOPHEN 325 MG PO TABS
650.00 mg | ORAL_TABLET | Freq: Four times a day (QID) | ORAL | 0 refills | Status: AC | PRN
Start: 2018-07-30 — End: 2018-08-29

## 2018-07-30 MED ORDER — ONDANSETRON HCL 4 MG/2ML IJ SOLN
INTRAMUSCULAR | Status: AC
Start: 2018-07-30 — End: 2018-07-30
  Filled 2018-07-30: qty 2

## 2018-07-30 MED ORDER — LIDOCAINE HCL (PF) 2 % IJ SOLN
Freq: Once | INTRAMUSCULAR | Status: DC | PRN
Start: 2018-07-30 — End: 2018-07-30
  Administered 2018-07-30: 2 mL via INTRAVENOUS

## 2018-07-30 MED ORDER — FENTANYL CITRATE 0.05 MG/ML IJ SOLN
Freq: Once | INTRAMUSCULAR | Status: DC | PRN
Start: 2018-07-30 — End: 2018-07-30
  Administered 2018-07-30: 100 ug via INTRAVENOUS

## 2018-07-30 MED ORDER — ACETAMINOPHEN 325 MG PO TABS: 650 mg | tablet | Freq: Four times a day (QID) | ORAL | 0 refills | 0 days | Status: AC | PRN

## 2018-07-30 MED ORDER — FAMOTIDINE 20 MG/2ML IV SOLN
20.00 mg | Freq: Once | INTRAVENOUS | Status: AC
Start: 2018-07-30 — End: 2018-07-30
  Administered 2018-07-30: 20 mg via INTRAVENOUS
  Filled 2018-07-30: qty 2

## 2018-07-30 MED ORDER — PROPOFOL 200 MG/20ML IV EMUL
Freq: Once | INTRAVENOUS | Status: DC | PRN
Start: 2018-07-30 — End: 2018-07-30
  Administered 2018-07-30: 150 mg via INTRAVENOUS

## 2018-07-30 MED ORDER — IBUPROFEN 600 MG PO TABS
600.00 mg | ORAL_TABLET | Freq: Three times a day (TID) | ORAL | 0 refills | Status: AC | PRN
Start: 2018-07-30 — End: 2018-08-29

## 2018-07-30 MED ORDER — LIDOCAINE HCL (PF) 2 % IJ SOLN
INTRAMUSCULAR | Status: AC
Start: 2018-07-30 — End: 2018-07-30
  Filled 2018-07-30: qty 5

## 2018-07-30 MED ORDER — ONDANSETRON HCL 4 MG/2ML IJ SOLN
4.0000 mg | Freq: Once | INTRAMUSCULAR | Status: DC | PRN
Start: 2018-07-30 — End: 2018-07-30

## 2018-07-30 MED ORDER — DEXAMETHASONE SODIUM PHOSPHATE 20 MG/5ML IJ SOLN
INTRAMUSCULAR | Status: AC
Start: 2018-07-30 — End: 2018-07-30
  Filled 2018-07-30: qty 5

## 2018-07-30 MED ORDER — MIDAZOLAM HCL 2 MG/2ML IJ SOLN
INTRAMUSCULAR | Status: AC
Start: 2018-07-30 — End: 2018-07-30
  Filled 2018-07-30: qty 2

## 2018-07-30 MED ORDER — LACTATED RINGERS IV BOLUS
INTRAVENOUS | Status: DC | PRN
Start: 2018-07-30 — End: 2018-07-30
  Administered 2018-07-30: 12:00:00 via INTRAVENOUS

## 2018-07-30 MED ORDER — FAMOTIDINE 20 MG PO TABS: 20 mg | tablet | Freq: Two times a day (BID) | ORAL | 3 refills | 0 days | Status: AC | PRN

## 2018-07-30 MED ORDER — IBUPROFEN 600 MG PO TABS: 600 mg | tablet | Freq: Three times a day (TID) | ORAL | 0 refills | 0 days | Status: AC | PRN

## 2018-07-30 MED ORDER — EPHEDRINE SULFATE-NACL 25-0.9 MG/5ML-% IV SOSY
PREFILLED_SYRINGE | Freq: Once | INTRAVENOUS | Status: DC | PRN
Start: 2018-07-30 — End: 2018-07-30
  Administered 2018-07-30 (×2): 10 mg via INTRAVENOUS

## 2018-07-30 MED ORDER — MIDAZOLAM HCL 2 MG/2ML IJ SOLN
Freq: Once | INTRAMUSCULAR | Status: DC | PRN
Start: 2018-07-30 — End: 2018-07-30
  Administered 2018-07-30: 2 mg via INTRAVENOUS

## 2018-07-30 MED ORDER — LACTATED RINGERS IV SOLN
INTRAVENOUS | Status: DC
Start: 2018-07-30 — End: 2018-07-30
  Administered 2018-07-30: 1000 mL via INTRAVENOUS

## 2018-07-30 MED ORDER — FENTANYL CITRATE 0.05 MG/ML IJ SOLN
INTRAMUSCULAR | Status: AC
Start: 2018-07-30 — End: 2018-07-30
  Filled 2018-07-30: qty 2

## 2018-07-30 MED ORDER — FENTANYL CITRATE 0.05 MG/ML IJ SOLN
25.0000 ug | INTRAMUSCULAR | Status: DC | PRN
Start: 2018-07-30 — End: 2018-07-30

## 2018-07-30 MED ORDER — DEXAMETHASONE SODIUM PHOSPHATE 4 MG/ML IJ SOLN
Freq: Once | INTRAMUSCULAR | Status: DC | PRN
Start: 2018-07-30 — End: 2018-07-30
  Administered 2018-07-30: 4 mg via INTRAVENOUS

## 2018-07-30 MED ORDER — PROPOFOL INFUSION
INTRAVENOUS | Status: AC
Start: 2018-07-30 — End: 2018-07-30
  Filled 2018-07-30: qty 50

## 2018-07-30 MED ORDER — NALOXONE HCL 0.4 MG/ML IJ SOLN
0.1000 mg | INTRAMUSCULAR | Status: DC | PRN
Start: 2018-07-30 — End: 2018-07-30

## 2018-07-30 SURGICAL SUPPLY — 14 items
AQUILEX INFLOW TUBE (ENDOFLD) ×2 IMPLANT
AQUILEX OUTFLOW TUBE (ENDOFLD) ×2 IMPLANT
BASIC SINGLE BASIN TRAY (BASIN) ×2 IMPLANT
BEMIS 3L SUCTION CANISTER (ENDOFLD) ×2 IMPLANT
GARMENT,LARGE FOOT,FG200-R (VASCCOMP) ×2 IMPLANT
GARMENT,LOWER LEG,17,L501-M (VASCCOMP) ×2 IMPLANT
MYOSURE DISPOSABLE DEVICE (ENDOSPEC) ×2 IMPLANT
MYOSURE ROD LENS SCOPE SEAL (ENDOINST) ×2 IMPLANT
MYOSURE XL DEVICE (ENDOSPEC) IMPLANT
NACL 3000 .9% 2B7127 (SOLUTION) ×4 IMPLANT
PACK D-AND-C (PACK) ×2 IMPLANT
SPONGE,8X4,XRAY,RFID (DRESSING) ×2 IMPLANT
STERILE SURGEON VEST (PPE) IMPLANT
SURGEON GLOVE LF/PF 7 STER (GLOVE) ×4 IMPLANT

## 2018-07-30 NOTE — Anesthesia Procedure Notes (Signed)
Airway  Date/Time: 07/30/2018 12:40 PM  Performed by: Isaiah Blakes, CRNA  Authorized by: Salley Scarlet, MD     Location:  OR  Urgency:  Elective  Difficult Airway: No    Atraumatic intubation  Performed by:  Resident/CRNA  Anesthesiologist:  Salley Scarlet, MD  Resident/CRNA:  Isaiah Blakes, CRNA  Indications and Patient Condition:     Indications for Airway Management:  Anesthesia and airway protection    Spontaneous Ventilation: absent      Sedation Level:  Deep    Preoxygenated: Yes      Patient Position:  Sniffing    MILS Maintained Throughout: Yes      Mask Difficulty Assessment:  1 - vent by mask  Final Airway Details:     Final Airway Type:  Supraglottic airway    Final Supraglottic Airway:  Classic    SGA Size:  4    Number of Attempts at Approach:  1    Number of Other Approaches Attempted:  0    Medications administered         Medications administered at: 07/30/2018 12:40 PM

## 2018-07-30 NOTE — Interval H&P Note (Signed)
Patient Assessment Update: (Fill out Prior to procedure or within 24 hours of  admission if H&P done pre-admission.)   Re-evaluation including history review and physical examination has been performed.    Patient's Condition No Change    Victoria Hase Sherlynn Stalls, MD, 07/30/2018

## 2018-07-30 NOTE — Anesthesia Preprocedure Evaluation (Signed)
Pre-Anesthetic Note        Patient: Victoria Macdonald is a 72 year old female      Procedure Information     Date/Time:  07/30/18 1130    Procedure:  HYSTEROSCOPY, WITH DILATION AND CURETTAGE OF UTERUS (N/A Uterus) - please schedule preop with me AND PAT visit. Surgery next available     Diagnosis:  Postmenopausal bleeding [N95.0]    Pre-op diagnosis:  Postmenopausal bleeding [N95.0]    Location:  [place] [facility] / [place] [facility]    Surgeon:  Ilda Mori, MD          Relevant Problems   No relevant active problems       Previous Anesthetic History:   Past Surgical History:  ~1985: ABDOMINAL SURGERY  ~1975: TUBAL LIGATION    Current Medications:      No current facility-administered medications for this encounter.     Home Medications    Medications Prior to Admission:  fexofenadine (ALLEGRA) 60 MG tablet Take 60 mg by mouth daily Disp:  Rfl:  07/29/2018 at Unknown time   famotidine (PEPCID) 10 MG tablet Take 1 tablet by mouth 2 (two) times daily To replace ranitidine Disp: 180 tablet Rfl: 3 07/28/2018   montelukast (SINGULAIR) 10 MG tablet TAKE 1 TABLET BY MOUTH EVERY DAY AT NIGHT Disp: 90 tablet Rfl: 3 07/28/2018   fluticasone (FLONASE) 50 MCG/ACT nasal spray 1 spray by Each Nostril route daily Disp: 3 Bottle Rfl: 3 07/28/2018   alendronate (FOSAMAX) 70 MG tablet with full glass of water on empty stomach-nothing else by mouth and do not lie down for 1/2 hour Disp: 4 tablet Rfl: 11 Unknown at Unknown time   meloxicam (MOBIC) 15 MG tablet Take 1 tablet by mouth daily as needed for Pain Disp: 90 tablet Rfl: 3 > Month at Unknown time   artificial tears (LUBRIFRESH P.M.) OINT Apply to eye Disp:  Rfl:  Unknown at Unknown time       Allergies:   Review of Patient's Allergies indicates:   Lactose intolerance*    Other (See Comments)    Comment:bloating    Smoking, Alcohol, Drugs:  Social History    Tobacco Use      Smoking status: Current Every Day Smoker        Packs/day: 0.25        Years: 53.00        Pack years: 13.25      Smokeless  tobacco: Never Used      Tobacco comment: currently smoking 5/day    Alcohol use: No      Alcohol/week: 0.0 oz      Drug use: No       PMHx:  Past Medical History:  No date: Back pain  05/13/2015: LLQ pain    Vitals  BP 156/79  Pulse 84  Temp 97.3 F (36.3 C) (Temporal)  Resp 18  Ht 5' (1.524 m)  Wt 61.2 kg (135 lb)  LMP  (LMP Unknown)  SpO2 98%  BMI 26.37 kg/m2      Physical Exam    General     Level of consciousness:  Alert and oriented (time, person, place)   Airway     Mallampati:  II    TM distance:  >3 FB    Mouth opening:  >3 FB    Neck ROM:  Full   Teeth  - normal exam  }   Heart   - normal exam  Lungs - normal exam           Review of Systems      Anesthetic Complications: negative anesthesia history ROS    Cardiovascular: Negative.    Pulmonary: Negative.    GU/Renal: Negative.    Hepatic: Negative.    Neurological: Negative.    Gastrointestinal: Positive for GERD.        Has current symptoms.    Hematological: Negative.    Endocrine: Negative.        Pertinent Labs:   Lab Results   Component Value Date    NA 143 03/01/2018    K 4.4 03/01/2018    CREAT 0.8 03/01/2018    GLUCOSER 82 03/01/2018    WBC 6.3 04/30/2018    HCT 41.9 04/30/2018    PLTA 143 (L) 04/30/2018    PT 11.4 04/30/2018    INR 1.0 04/30/2018         Anesthesia Plan    ASA Score:     ASA:  2    Airway:      Mallampati:  II    Mouth opening:  >3 FB    Neck ROM:  Full    TM distance:  >3 FB    Plan: general    Other information:     EKG Reviewed: : Yes      Full Stomach Precaution:: No      Post-Plan::  PACU    Informed Consent:     Anesthetic plan and risks discussed with:  Patient

## 2018-07-30 NOTE — Discharge Instructions (Signed)
SURGICAL DAY CARE DISCHARGE INSTRUCTIONS  FOR GYN PATIENTS  ................................................................................................................................................     When you return home, you may still feel sleepy. Get plenty of rest for the remainder of the day.     Do not drive, operate machinery or make important decisions for the remainder of the day.     Do not consume alcoholic beverages or take medicines other than those prescribed by your doctor for the remainder of the day.      Start with water, apple juice or other soft drinks, then gradually progress to your normal diet as tolerated unless otherwise advised.      Keep your dressing or the area affected by your operation clean and dry until otherwise instructed by your physician.     If you have been given any prescriptions, be sure to have them filled and take as directed.      If you have any problems or unanticipated symptoms, call gynecology at [phone].     The above instructions have been explained to me and I fully understand them.  ................................................................................................................................................    SPECIFIC INSTRUCTIONS: (To be filled out by physician): Your procedure went very well. Nothing in the vagina for 2 weeks. No sex, tampons, etc.     Prescription: Yes Please list :    Victoria Macdonald, Victoria Macdonald   Home Medication Instructions QMG:[id number]    Printed on:07/30/18 1316   Medication Information                      acetaminophen (TYLENOL) 325 MG tablet  Take 2 tablets by mouth every 6 (six) hours as needed for Pain             alendronate (FOSAMAX) 70 MG tablet  with full glass of water on empty stomach-nothing else by mouth and do not lie down for 1/2 hour             artificial tears (LUBRIFRESH P.M.) OINT  Apply to eye             famotidine (PEPCID) 10 MG tablet  Take 1 tablet by mouth 2 (two) times daily To  replace ranitidine             fexofenadine (ALLEGRA) 60 MG tablet  Take 60 mg by mouth daily             fluticasone (FLONASE) 50 MCG/ACT nasal spray  1 spray by Each Nostril route daily             ibuprofen (ADVIL,MOTRIN) 600 MG tablet  Take 1 tablet by mouth every 8 (eight) hours as needed for Pain pain             meloxicam (MOBIC) 15 MG tablet  Take 1 tablet by mouth daily as needed for Pain             montelukast (SINGULAIR) 10 MG tablet  TAKE 1 TABLET BY MOUTH EVERY DAY AT NIGHT                   Keep your dressing clean and dry until: N/A    Bath: No  Shower: Yes    No sexual intercourse/tampon use for: 14 Days    Resumption of normal work activities on: 1-2 days    Date MD Name: Telephone:   07/30/2018 Ilda Mori, MD     [phone]

## 2018-07-30 NOTE — Anesthesia Postprocedure Evaluation (Signed)
Anesthesia Post-Operative Evaluation Note    Patient: Victoria Macdonald           Procedure Summary     Date:  07/30/18 Room / Location:  [place] 02 / [place] [facility]    Anesthesia Start:  1218 Anesthesia Stop:  7711    Procedure:  HYSTEROSCOPY, WITH DILATION AND CURETTAGE OF UTERUS (N/A Uterus) Diagnosis:       Postmenopausal bleeding      (Postmenopausal bleeding [N95.0])    Surgeon:  Ilda Mori, MD Responsible Provider:  Salley Scarlet, MD    Anesthesia Type:  general ASA Status:  2            POST-OPERATIVE EVALUATION    Anesthesia Post Evaluation    Vitals signs in patient's normal range: Yes  Respiratory function stable; airway patent: Yes  Cardiovascular function stable: Yes  Hydration status stable: Yes  Mental status recovered; patient participates in evaluation and/or is at baseline: Yes  Pain control satisfactory: Yes  Nausea and vomiting control satisfactory: Yes    Procedure was labor & delivery no  PostOP disposition PACU  Anesthesia Observation no significant observation      Last vitals  BP   139/72 (07/30/18 1400)    Temp    Pulse   89 (07/30/18 1400)   Resp   20 (07/30/18 1400)    SpO2   94 % (07/30/18 1400)

## 2018-07-30 NOTE — Op Note (Signed)
Date of Procedure: 07/30/18    Name of dictator: Sherlynn Stalls, [place]  Name of attending: Either, Melissa  Patients name: Rolanda, Campa.  Patients MRN: [id number]  Pre-op diagnosis: PMB  Post-op diagnosis:  same    Procedure:  1. Exam Under Anesthesia  2. Diagnostic Hysteroscopy  3. Dilation and curettage      Surgeon: Either, Melissa  Assistant: Vena Rua, Royetta Asal.    Indications: 72yo G4P3 who presented with pink/brown discharge since May. She had a PUS at that time that showed a small uterus with thickened EMS of 33mm.      The patient was counseled on all her options including EMBx vs [place] D&C in OR and opted for [facility], D&C in OR.    The risk and benefits of the procedure were reviewed in detail including but not limited to the risk of bleeding, infection, wound complications, pain, damage to surrounding organs such as bowel, bladder, nerves, and vessels, need for further procedures, uterine perforation, and uterine scarring.     The patient expressed understanding. Questions answered to the patient's apparent satisfaction. The patient consented to the procedure.     Findings:  1. EUA: small, mobile, anteverted, no adnexal fullness or palpable masses    2. Uterine cavity: normal ostia bilaterally. Normal fundus, atrophic endometrium with small < 30mm endocervical polyp    Anesthesia:GA    EBL: 0cc    IVF: 500cc    UO: 10cc    Saline deficit: 150    Abe: none given and none indicated    Specimens:EMC    Complications: none    Condition: stable to PACU     Procedure in detail: A timeout was performed confirming the correct patient and procedure. Pneumoboots were placed bilaterally. After adequate general anesthesia was achieved, the patient was placed in the lithotomy position using yellow fin stirrups.     Exam under anesthesia revealed: small, mobile, anteverted, no adnexal fullness or palpable masses    The patient was then prepped and draped in the usual fashion.     A final operative time-out was done  with the entire OR staff after the draping of the patient, prior to starting the procedure     The bladder was straight catheterized for 10cc of clear yellow urine.    A bivalved speculum was then placed into the vagina and the cervix was viewed in its entirety.     A single tooth tenaculum was placed on the anterior lip of the cervix.     The cervix was then sequentially dilated to a number 17- dilator using the [facility] dilators.     A 5 mm Diagnostic hysteroscope  was then easily introduced into the uterine cavity.     Saline was used to distend the cavity, and the intrauterine contents were then viewed in their entirety, showing normal ostia bilaterally. Normal fundus, atrophic endometrium with small < 43mm endocervical polyp      A gentle sharp curettage was done until a gritty texture was noted in all four quadrants of the uterus. Endometrial curettings were collected and placed in formalin for pathological analysis.     The single tooth tenaculum was then removed from the cervix, and excellent hemostasis was noted at the tenaculum sites. The speculum was removed from the vagina.     All counts were correct per OR staff. The patient tolerated the procedure well, and was taken to the recovery room in good condition.

## 2018-07-30 NOTE — Brief Op Note (Signed)
Brief Procedure or Operative Note    Procedure:  Procedure(s):  HYSTEROSCOPY, WITH DILATION AND CURETTAGE OF UTERUS    Pre-Procedure Diagnosis: Pre-op Diagnosis     * Postmenopausal bleeding [N95.0]     Post-Procedure Diagnosis: Post-op Diagnosis     * Postmenopausal bleeding [N95.0]    Surgeon: Primary: Ilda Mori, MD    Assistant (if applicable): Sherlynn Stalls, Jamielynn Wigley       Type of Anesthesia:  General    Findings:   EUA: small, mobile, anteverted, no adnexal fullness   HSC: normal ostia bilaterally. Normal fundus, atrophic endometrium with small < 48mm endocervical polyp    Estimated Blood Loss: 0 mL  Saline deficit: 150cc  IVF 500cc    Specimens Removed:   ID Type Source Tests Collected by Time   A : [place] Ilda Mori, MD 12/28/5434 0677       Complications:  None    Other (e.g. Implants):  * No implants in log *   Peripheral IV 07/30/18 Left Hand (Active)         Younes Degeorge Sherlynn Stalls, MD  Pager : [phone]                  .

## 2018-07-31 LAB — SURGICAL PATH SPECIMEN GYN

## 2018-07-31 NOTE — Surgery Post-Op (Signed)
Procedure: Procedure(s):  HYSTEROSCOPY, WITH DILATION AND CURETTAGE OF UTERUS      Pre-Procedure Diagnosis: Pre-op Diagnosis     * Postmenopausal bleeding [N95.0]     Post-Procedure Diagnosis: Post-op Diagnosis     * Postmenopausal bleeding [N95.0]    Surgeon: Primary: Ilda Mori, MD    Assistant (if applicable):     Type of Anesthesia:  General      POST OP CALL:   Able to speak with Patient:  Left message    Attempted to call patient for post-op phone call.  No answer.  Message left for patient to call MD if any problems or concerns. Phone number provided.          Lonia Skinner, RN

## 2018-08-01 ENCOUNTER — Ambulatory Visit: Payer: No Typology Code available for payment source | Attending: Family Medicine | Admitting: Surgery

## 2018-08-01 VITALS — BP 132/73 | HR 80 | Temp 97.9°F

## 2018-08-01 DIAGNOSIS — K432 Incisional hernia without obstruction or gangrene: Secondary | ICD-10-CM

## 2018-08-02 NOTE — Progress Notes (Signed)
72 year old female consultation requested for evaluation of a incisional hernia.  The patient has a noticeable lump above her previous incision however she states it is always been there and is completely asymptomatic for her.  She was referred for evaluation of possible incisional hernia.  At this point she is not interested in any further surgery as she just had surgery on her uterus last week and she is still recovering from that.      Physical examination today examined both supine and standing do reveal a somewhat asymmetric bulge above the incision however not reducible without any evidence of incarceration or strangulation and may not in fact be a hernia.  I suggested to her that we follow-up with her in a few weeks to further evaluate and possibly order a CT scan to further delineate the internal anatomy.  She understands and agrees with this plan and will follow-up with [place] at that time and an appointment was made to that    I have spent 20 minutes in face to face time with this patient/patient proxy of which > 50% was in counseling or coordination of care regarding above issues/Dx.

## 2018-08-13 ENCOUNTER — Encounter (HOSPITAL_BASED_OUTPATIENT_CLINIC_OR_DEPARTMENT_OTHER): Payer: Self-pay | Admitting: Ophthalmology

## 2018-08-13 ENCOUNTER — Ambulatory Visit: Payer: No Typology Code available for payment source | Attending: Family Medicine | Admitting: Ophthalmology

## 2018-08-13 DIAGNOSIS — H01006 Unspecified blepharitis left eye, unspecified eyelid: Secondary | ICD-10-CM | POA: Diagnosis present

## 2018-08-13 DIAGNOSIS — H0102A Squamous blepharitis right eye, upper and lower eyelids: Secondary | ICD-10-CM | POA: Diagnosis present

## 2018-08-13 DIAGNOSIS — Z9889 Other specified postprocedural states: Secondary | ICD-10-CM | POA: Diagnosis present

## 2018-08-13 DIAGNOSIS — H01003 Unspecified blepharitis right eye, unspecified eyelid: Secondary | ICD-10-CM

## 2018-08-13 DIAGNOSIS — H524 Presbyopia: Secondary | ICD-10-CM | POA: Insufficient documentation

## 2018-08-13 DIAGNOSIS — H0102B Squamous blepharitis left eye, upper and lower eyelids: Secondary | ICD-10-CM | POA: Insufficient documentation

## 2018-08-13 DIAGNOSIS — H527 Unspecified disorder of refraction: Secondary | ICD-10-CM | POA: Diagnosis not present

## 2018-08-13 DIAGNOSIS — H04129 Dry eye syndrome of unspecified lacrimal gland: Secondary | ICD-10-CM | POA: Diagnosis present

## 2018-08-13 DIAGNOSIS — H52203 Unspecified astigmatism, bilateral: Secondary | ICD-10-CM | POA: Insufficient documentation

## 2018-08-13 DIAGNOSIS — Z961 Presence of intraocular lens: Secondary | ICD-10-CM | POA: Diagnosis present

## 2018-08-13 HISTORY — DX: Presbyopia: H52.4

## 2018-08-13 HISTORY — DX: Dry eye syndrome of unspecified lacrimal gland: H04.129

## 2018-08-13 HISTORY — DX: Presbyopia: H52.203

## 2018-08-13 HISTORY — DX: Squamous blepharitis right eye, upper and lower eyelids: H01.02A

## 2018-08-13 MED ORDER — ERYTHROMYCIN 5 MG/GM OP OINT
TOPICAL_OINTMENT | OPHTHALMIC | 12 refills | Status: AC
Start: 2018-08-13 — End: 2019-08-14

## 2018-08-13 MED ORDER — CARBOXYMETHYLCELLULOSE SODIUM 0.5 % OP SOLN
1.00 [drp] | Freq: Four times a day (QID) | OPHTHALMIC | 12 refills | Status: AC
Start: 2018-08-13 — End: 2020-08-12

## 2018-08-13 NOTE — Progress Notes (Signed)
72 yrs old/F    Pseudophakia OU, s/p cataract surgery OU  03/19/18 and 04/01/18 in [place], Here for Comprehensive eye exam,   VA stable OU since last visit,  Denies floaters/flashes light.    Ocular meds:  None

## 2018-08-13 NOTE — Progress Notes (Signed)
Patient presents with:  Foreign Body Sensation,eyes: Foreign body sensation, OS more than OD. 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   She is instructed to apply warm compresses to her closed eyelids twice a day, for five minutes at a time.  She will clean her eyelid margins gently, with diluted baby shampoo every evening.  She is encouraged to apply Ilotycin ointment inside both eyelids every evening for at least one month.  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  She will use artificial tears in both eyes, 4-6 times a day.      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

## 2018-08-13 NOTE — Patient Instructions (Signed)
Victoria Macdonald    You have a common, and mild condition known as blepharitis.  It could be thought of as analogous to a dandruff of the eye lashes.  There is an inflammation and irritation of your eyelid margins in both eyes.  A flaky, but oily skin at the base of the eyelashes causes inflammation.  The normal bacteria on the skin overgrow.  This sheds into your eyes, causing your eyes to be itchy, swollen and red.  You should:  1) Soak your closed eyelids four five minutes at a time with a warm face clothe every day.  2) Clean the eyelid margins with diluted baby shampoo daily.  Use a teaspoon of baby shampoo in a glass of warm water, then dip a face clothe into this, and use to gently clean the lid margins once a day.  3) Apply the Ilotycin ointment which I prescribed for you, every evening, inside the eyelid and on the lid margin, once a day for a month.    Gershon Crane, MD  [phone]

## 2018-08-14 ENCOUNTER — Ambulatory Visit
Payer: No Typology Code available for payment source | Attending: Obstetrics & Gynecology | Admitting: Obstetrics & Gynecology

## 2018-08-14 VITALS — BP 112/75 | HR 83 | Wt 138.0 lb

## 2018-08-14 DIAGNOSIS — Z09 Encounter for follow-up examination after completed treatment for conditions other than malignant neoplasm: Secondary | ICD-10-CM

## 2018-08-14 NOTE — Progress Notes (Signed)
Victoria Macdonald  is here for a 2 week post operative check from her hysteroscopy for PMB.  She is having no pain She has normal bowel and bladder function, and is back to normal activity.     Results discussed   Findings of uterine atrophy

## 2018-08-25 ENCOUNTER — Other Ambulatory Visit (HOSPITAL_BASED_OUTPATIENT_CLINIC_OR_DEPARTMENT_OTHER): Payer: Self-pay | Admitting: Family Medicine

## 2018-08-26 NOTE — Progress Notes (Signed)
PER Pharmacy, Victoria Macdonald is a 72 year old female has requested a refill of allegra.      Last Office Visit: 04/30/2018 with Fredirick Lathe  Last Physical Exam: 09/27/2017      Other Med Adult:  Most Recent BP Reading(s)  08/14/18 : 112/75        Cholesterol (mg/dL)   Date Value   03/04/2015 235     LOW DENSITY LIPOPROTEIN DIRECT (mg/dL)   Date Value   03/04/2015 171     HIGH DENSITY LIPOPROTEIN (mg/dL)   Date Value   03/04/2015 45     No results found for: TG      No results found for: TSHSC      No results found for: TSH    No results found for: HGBA1C    No results found for: POCA1C      INR (no units)   Date Value   04/30/2018 1.0       SODIUM (mmol/L)   Date Value   03/01/2018 143       POTASSIUM (mmol/L)   Date Value   03/01/2018 4.4           CREATININE (mg/dL)   Date Value   03/01/2018 0.8       Documented patient preferred pharmacies:    [id number] [address], [place] - [place]  Phone: [phone] Fax: [phone]

## 2018-09-16 ENCOUNTER — Encounter (HOSPITAL_BASED_OUTPATIENT_CLINIC_OR_DEPARTMENT_OTHER): Payer: No Typology Code available for payment source | Admitting: Internal Medicine

## 2018-10-15 ENCOUNTER — Encounter (HOSPITAL_BASED_OUTPATIENT_CLINIC_OR_DEPARTMENT_OTHER): Payer: Self-pay | Admitting: Surgery

## 2018-11-23 ENCOUNTER — Other Ambulatory Visit (HOSPITAL_BASED_OUTPATIENT_CLINIC_OR_DEPARTMENT_OTHER): Payer: Self-pay | Admitting: Family Medicine

## 2018-11-23 DIAGNOSIS — M5442 Lumbago with sciatica, left side: Principal | ICD-10-CM

## 2018-11-23 DIAGNOSIS — G8929 Other chronic pain: Secondary | ICD-10-CM

## 2018-11-23 NOTE — Progress Notes (Unsigned)
PER Patient (self), Victoria Macdonald is a 73 year old female has requested a refill of meloxicam.      Last Office Visit: 04/30/18  with d'agata  Last Physical Exam: 09/27/17    There are no preventive care reminders to display for this patient.    Other Med Adult:  Most Recent BP Reading(s)  08/14/18 : 112/75        Cholesterol (mg/dL)   Date Value   03/04/2015 235     LOW DENSITY LIPOPROTEIN DIRECT (mg/dL)   Date Value   03/04/2015 171     HIGH DENSITY LIPOPROTEIN (mg/dL)   Date Value   03/04/2015 45     No results found for: TG      No results found for: TSHSC      No results found for: TSH    No results found for: HGBA1C    No results found for: POCA1C      INR (no units)   Date Value   04/30/2018 1.0       SODIUM (mmol/L)   Date Value   03/01/2018 143       POTASSIUM (mmol/L)   Date Value   03/01/2018 4.4           CREATININE (mg/dL)   Date Value   03/01/2018 0.8       Documented patient preferred pharmacies:    [id number] [address], [place] - [place]  Phone: [phone] Fax: [phone]

## 2018-12-19 ENCOUNTER — Ambulatory Visit (HOSPITAL_BASED_OUTPATIENT_CLINIC_OR_DEPARTMENT_OTHER): Payer: Self-pay | Admitting: Registered Nurse

## 2018-12-19 NOTE — Telephone Encounter (Signed)
Regarding: Stomach issues/cold   ----- Message from Valentina Gu sent at 12/19/2018  9:38 AM EST -----  Victoria Macdonald [id number], 73 year old, female    Calls today:  Sick    What are the symptoms patient is having stomach issues believes she may have a cold or allergies   Person calling on behalf of patient: Patient (self)    Rod Can NUMBER: [phone]    Patient's language of care: English    Patient does not need an interpreter.    Patient's PCP: Fredirick Lathe, MD

## 2018-12-19 NOTE — Telephone Encounter (Signed)
Return call to patient.  Patient reports "glands swollen" and throat pain (right more then left)  x 1 week. She went to the ER in [place] because her insurance covers visits to an ER. Patient reports they did an ultrasound which was negative, and she was discharged. Denies SOB, difficulty swallowing, and is able to manage secretions.     Patient reports tylenol provides relief - swelling also improved when she took an old prescription of amoxicillin. She reports she is in [place] until march 7th, requests appointment the following week- scheduled for March 9th with PCP.     Advised tylenol/motrin and to go to ER if difficulty breathing/swallowing. Patient states understanding. Encouraged to call with questions/concerns.    Hillery Jacks, RN, 12/19/2018

## 2018-12-30 ENCOUNTER — Ambulatory Visit: Payer: No Typology Code available for payment source | Attending: Family Medicine | Admitting: Family Medicine

## 2018-12-30 ENCOUNTER — Encounter (HOSPITAL_BASED_OUTPATIENT_CLINIC_OR_DEPARTMENT_OTHER): Payer: Self-pay | Admitting: Family Medicine

## 2018-12-30 VITALS — BP 120/60 | HR 89 | Temp 98.1°F | Wt 136.0 lb

## 2018-12-30 DIAGNOSIS — R221 Localized swelling, mass and lump, neck: Secondary | ICD-10-CM | POA: Diagnosis present

## 2018-12-30 DIAGNOSIS — R918 Other nonspecific abnormal finding of lung field: Secondary | ICD-10-CM | POA: Diagnosis not present

## 2018-12-30 DIAGNOSIS — K5900 Constipation, unspecified: Secondary | ICD-10-CM | POA: Insufficient documentation

## 2018-12-30 DIAGNOSIS — D696 Thrombocytopenia, unspecified: Secondary | ICD-10-CM | POA: Diagnosis present

## 2018-12-30 MED ORDER — POLYETHYLENE GLYCOL 3350 PO POWD: 17 g | g | Freq: Every day | ORAL | 0 refills | 0 days | Status: AC | PRN

## 2018-12-30 MED ORDER — POLYETHYLENE GLYCOL 3350 17 GM/SCOOP PO POWD
17.00 g | Freq: Every day | ORAL | 0 refills | Status: AC | PRN
Start: 2018-12-30 — End: 2019-03-30

## 2018-12-30 NOTE — Progress Notes (Signed)
Family Medicine Office Note    CC:  Patient presents with:  ER F/U: in [place] allergic bronchitis, still feeling down and tired      HPI:    R neck swelling:  Seen in ER in [place]  Enlarged R gland- it was painful- now fine  [place] Head and neck soft tissue- 3.3 cm R submanidibular gland  Pt was negative for flu and strep  Labs unremarkable  Pt had CXR- neg for infection   CT Spine lumbar wo contrast- for hip pain- no fracture   CT pelvis- for him pain- unremarkable except diverticulosis,   Pt has been alternating fexofenadine and montelukast    Prescribed  Albuterol - 2 days and stopped due to trembling  Azithromycin- completed therapy  Prednisone- finished   Ibruprofen- took once and stopped   Hydromet- pt did not know it was an opiate- still taking it but will stop     Since Friday having L neck swelling  Was vomiting on Friday too  It is painful too- painful radiated to back of neck as well  L neck is improving. Vomiting resolved      Social/PMH/FHx:   Visited son and his family in [place]- decreased a lot. Decided she has to. 4-5 cig a day and some days less. Playing games instead.     ROS:   Constipation- not stooling daily and normally regular    EXAM:  BP 120/60  Pulse 89  Temp 98.1 F (36.7 C)  Wt 61.7 kg (136 lb)  LMP  (LMP Unknown)  SpO2 95%  BMI 26.56 kg/m2  GEN: Well appearing, NAD  NECK   L neck with mild enlarged mass superior to L clavicle about 2-3cm. No other lymphadenopathy or enlargement.   MOOD: Normal thought content, speech, affect, mood and dress are noted.    ASSESSMENT/PLAN:    Victoria Macdonald is a 73 year old female who presents for    1. Localized swelling, mass and lump, neck  May be lymph node or cyst or other mass.   Will start with [place] for this tissue. May need CT scan.  - US NECK SOFT TISSUE; Future    2. Lung nodules  Chronic. Pt decreasing tobacco use. Declines quitting at this time  - CT LUNG CANCER SCREENING FOLLOW UP; Future    3. Constipation, unspecified constipation  type  Likely from traveling and recent illness. Trial of glycolax daily PRN  - polyethylene glycol (GLYCOLAX) powder; Take 17 g by mouth daily as needed  Dispense: 255 g; Refill: 0    4. Thrombocytopenia ([place])  Noted on labs from [place]  Stable      **AVS given and patient instructions reviewed with patient, who had the opportunity to ask questions. The pt verbalizes and demonstrated understanding. Advised to call/RTC with concerns.

## 2019-01-10 ENCOUNTER — Other Ambulatory Visit: Payer: Self-pay

## 2019-01-27 ENCOUNTER — Inpatient Hospital Stay (HOSPITAL_BASED_OUTPATIENT_CLINIC_OR_DEPARTMENT_OTHER): Admission: RE | Admit: 2019-01-27 | Payer: Self-pay | Source: Ambulatory Visit

## 2019-01-27 ENCOUNTER — Other Ambulatory Visit (HOSPITAL_BASED_OUTPATIENT_CLINIC_OR_DEPARTMENT_OTHER): Payer: Self-pay

## 2019-01-28 ENCOUNTER — Other Ambulatory Visit: Payer: Self-pay

## 2019-02-06 ENCOUNTER — Ambulatory Visit (HOSPITAL_BASED_OUTPATIENT_CLINIC_OR_DEPARTMENT_OTHER): Payer: Self-pay | Admitting: Surgery

## 2019-02-18 ENCOUNTER — Ambulatory Visit (HOSPITAL_BASED_OUTPATIENT_CLINIC_OR_DEPARTMENT_OTHER): Payer: Self-pay | Admitting: Medical

## 2019-02-20 ENCOUNTER — Ambulatory Visit (HOSPITAL_BASED_OUTPATIENT_CLINIC_OR_DEPARTMENT_OTHER): Payer: Self-pay | Admitting: Surgery

## 2019-02-21 ENCOUNTER — Other Ambulatory Visit (HOSPITAL_BASED_OUTPATIENT_CLINIC_OR_DEPARTMENT_OTHER): Payer: Self-pay | Admitting: Family Medicine

## 2019-02-21 DIAGNOSIS — G8929 Other chronic pain: Secondary | ICD-10-CM

## 2019-02-21 DIAGNOSIS — M5442 Lumbago with sciatica, left side: Principal | ICD-10-CM

## 2019-02-21 MED ORDER — MELOXICAM 15 MG PO TABS: 15 mg | tablet | Freq: Every day | ORAL | 0 refills | 0 days | Status: AC | PRN

## 2019-02-21 MED ORDER — MELOXICAM 15 MG PO TABS
15.0000 mg | ORAL_TABLET | Freq: Every day | ORAL | 0 refills | Status: DC | PRN
Start: 2019-02-21 — End: 2019-05-19

## 2019-02-21 NOTE — Progress Notes (Signed)
PER Pharmacy, Bonetta Mostek is a 73 year old female has requested a refill of Mobic.      Last Office Visit: 12/30/2018 with pcp  Last Physical Exam: 09/27/2017    There are no preventive care reminders to display for this patient.    Other Med Adult:  Most Recent BP Reading(s)  12/30/18 : 120/60        Cholesterol (mg/dL)   Date Value   03/04/2015 235     LOW DENSITY LIPOPROTEIN DIRECT (mg/dL)   Date Value   03/04/2015 171     HIGH DENSITY LIPOPROTEIN (mg/dL)   Date Value   03/04/2015 45     No results found for: TG      No results found for: TSHSC      No results found for: TSH    No results found for: HGBA1C    No results found for: POCA1C      INR (no units)   Date Value   04/30/2018 1.0       SODIUM (mmol/L)   Date Value   03/01/2018 143       POTASSIUM (mmol/L)   Date Value   03/01/2018 4.4           CREATININE (mg/dL)   Date Value   03/01/2018 0.8       Documented patient preferred pharmacies:    [id number] [address], [place] - [place]  Phone: [phone] Fax: [phone]

## 2019-03-19 ENCOUNTER — Inpatient Hospital Stay (HOSPITAL_BASED_OUTPATIENT_CLINIC_OR_DEPARTMENT_OTHER): Admit: 2019-03-19 | Payer: Self-pay

## 2019-03-19 ENCOUNTER — Other Ambulatory Visit (HOSPITAL_BASED_OUTPATIENT_CLINIC_OR_DEPARTMENT_OTHER): Payer: Self-pay

## 2019-03-20 ENCOUNTER — Ambulatory Visit (HOSPITAL_BASED_OUTPATIENT_CLINIC_OR_DEPARTMENT_OTHER): Payer: Self-pay | Admitting: Surgery

## 2019-03-22 ENCOUNTER — Other Ambulatory Visit (HOSPITAL_BASED_OUTPATIENT_CLINIC_OR_DEPARTMENT_OTHER): Payer: Self-pay | Admitting: Obstetrics & Gynecology

## 2019-03-22 NOTE — Progress Notes (Signed)
PER Pharmacy, Victoria Macdonald is a 73 year old female has requested a refill of famotidine.      Last Office Visit: 12/30/18 with D'Agata, C  Last Physical Exam: 09/27/2017      Other Med Adult:  Most Recent BP Reading(s)  12/30/18 : 120/60        Cholesterol (mg/dL)   Date Value   03/04/2015 235     LOW DENSITY LIPOPROTEIN DIRECT (mg/dL)   Date Value   03/04/2015 171     HIGH DENSITY LIPOPROTEIN (mg/dL)   Date Value   03/04/2015 45     No results found for: TG      No results found for: TSHSC      No results found for: TSH    No results found for: HGBA1C    No results found for: POCA1C      INR (no units)   Date Value   04/30/2018 1.0       SODIUM (mmol/L)   Date Value   03/01/2018 143       POTASSIUM (mmol/L)   Date Value   03/01/2018 4.4           CREATININE (mg/dL)   Date Value   03/01/2018 0.8       Documented patient preferred pharmacies:    [id number] [address], [place] - [place]  Phone: [phone] Fax: [phone]

## 2019-03-25 ENCOUNTER — Other Ambulatory Visit: Payer: Self-pay

## 2019-04-10 ENCOUNTER — Other Ambulatory Visit (HOSPITAL_BASED_OUTPATIENT_CLINIC_OR_DEPARTMENT_OTHER): Payer: Self-pay

## 2019-04-10 ENCOUNTER — Telehealth (HOSPITAL_BASED_OUTPATIENT_CLINIC_OR_DEPARTMENT_OTHER): Payer: Self-pay | Admitting: Surgery

## 2019-04-17 ENCOUNTER — Other Ambulatory Visit (HOSPITAL_BASED_OUTPATIENT_CLINIC_OR_DEPARTMENT_OTHER): Payer: Self-pay | Admitting: Family Medicine

## 2019-04-17 ENCOUNTER — Other Ambulatory Visit: Payer: Self-pay

## 2019-04-17 DIAGNOSIS — M810 Age-related osteoporosis without current pathological fracture: Secondary | ICD-10-CM

## 2019-04-17 NOTE — Progress Notes (Signed)
PER Pharmacy, Victoria Macdonald is a 73 year old female has requested a refill of alendronate 70.      Last Office Visit: 12-30-18 with pcp  Last Physical Exam: 09-27-17    There are no preventive care reminders to display for this patient.    Other Med Adult:  Most Recent BP Reading(s)  12/30/18 : 120/60        Cholesterol (mg/dL)   Date Value   03/04/2015 235     LOW DENSITY LIPOPROTEIN DIRECT (mg/dL)   Date Value   03/04/2015 171     HIGH DENSITY LIPOPROTEIN (mg/dL)   Date Value   03/04/2015 45     No results found for: TG      No results found for: TSHSC      No results found for: TSH    No results found for: HGBA1C    No results found for: POCA1C      INR (no units)   Date Value   04/30/2018 1.0       SODIUM (mmol/L)   Date Value   03/01/2018 143       POTASSIUM (mmol/L)   Date Value   03/01/2018 4.4           CREATININE (mg/dL)   Date Value   03/01/2018 0.8       Documented patient preferred pharmacies:    [id number] [address], [place] -  [place]  Phone: [phone] Fax: [phone]

## 2019-04-17 NOTE — Progress Notes (Signed)
Refilled medication  Pt needs a follow up to go through imaging and specialty follow up care that was cancelled/rescheduled due to COVID

## 2019-05-16 ENCOUNTER — Encounter (HOSPITAL_BASED_OUTPATIENT_CLINIC_OR_DEPARTMENT_OTHER): Payer: Self-pay | Admitting: Family Medicine

## 2019-05-19 ENCOUNTER — Ambulatory Visit: Payer: No Typology Code available for payment source | Attending: Family Medicine | Admitting: Family Medicine

## 2019-05-19 ENCOUNTER — Other Ambulatory Visit: Payer: Self-pay

## 2019-05-19 DIAGNOSIS — R918 Other nonspecific abnormal finding of lung field: Secondary | ICD-10-CM | POA: Diagnosis present

## 2019-05-19 DIAGNOSIS — R221 Localized swelling, mass and lump, neck: Secondary | ICD-10-CM | POA: Diagnosis present

## 2019-05-19 DIAGNOSIS — F17200 Nicotine dependence, unspecified, uncomplicated: Secondary | ICD-10-CM | POA: Diagnosis not present

## 2019-05-19 DIAGNOSIS — M2550 Pain in unspecified joint: Secondary | ICD-10-CM | POA: Insufficient documentation

## 2019-05-19 MED ORDER — DICLOFENAC SODIUM 75 MG PO TBEC
75.0000 mg | DELAYED_RELEASE_TABLET | Freq: Every day | ORAL | 0 refills | Status: DC | PRN
Start: 2019-05-19 — End: 2019-07-13

## 2019-05-19 NOTE — Progress Notes (Signed)
[facility] FAMILY MEDICINE  Telehealth Visit Note   Subjective:   CC: Victoria Macdonald is a 73 year old female patient who presents today for telehealth visit for No chief complaint on file.     #Joint pain  Taking diclofenac 75mg  and it working well  Using it for hip pain and knee pain.   Taking it for 1-2 days and it resolves. Meloxicam does not help  Taking meloxicam for back pain PRN. Diclofenac does not help with the back pain   Never took meloxicam or diclofenac on the same day  Taking magnesium and tumeric and lion root for joint pain     #allergies  Taking fexofenadine and montelukast     #osteoporosis   Taking alendronate weekly    #GERD  Stopped famotidine  GERD does well if she eats the right things  Avoiding spicy foods and fatty foods  Vegetables help     #Chest CT  Has been cancelled on her 2-3 times  She is waiting for them to reschedule.    SH/PMH:  Tobacco use- daily. Has tried to cut down. Increased with staying home with COVID    ROS:  Per HPI   Having some pedal edema. No SOB.  Throat lump comes and goes.       Current Outpatient Medications on File Prior to Visit:  alendronate (FOSAMAX) 70 MG tablet WITH FULL GLASS OF WATER ON EMPTY STOMACH-NOTHING ELSE BY MOUTH AND DO NOT LIE DOWN FOR 1/2 HOUR Disp: 12 tablet Rfl: 3   [DISCONTINUED] famotidine (PEPCID) 20 MG tablet TAKE 1 TABLET BY MOUTH TWICE A DAY AS NEEDED Disp: 60 tablet Rfl: 3   [DISCONTINUED] meloxicam (MOBIC) 15 MG tablet Take 1 tablet by mouth daily as needed for Pain Disp: 90 tablet Rfl: 0   fexofenadine (ALLEGRA) 180 MG tablet TAKE 1 TABLET BY MOUTH EVERY DAY Disp: 90 tablet Rfl: 3   erythromycin (ROMYCIN) ophthalmic ointment Apply to both eyes, every evening Disp: 3.5 g Rfl: 12   carboxymethylcellulose (REFRESH TEARS) 0.5 % SOLN Place 1 drop into both eyes 4 (four) times daily Disp: 1 Bottle Rfl: 12   montelukast (SINGULAIR) 10 MG tablet TAKE 1 TABLET BY MOUTH EVERY DAY AT NIGHT Disp: 90 tablet Rfl: 3   fluticasone (FLONASE) 50 MCG/ACT  nasal spray 1 spray by Each Nostril route daily Disp: 3 Bottle Rfl: 3   artificial tears (LUBRIFRESH P.M.) OINT Apply to eye Disp:  Rfl:      No current facility-administered medications on file prior to visit.   Objective:     Exam - vitals deferred in setting of COVID pandemic  Speaking in complete sentences, breathing comfortably  Able to concentrate throughout visit    Assessment & Plan:     1. Arthralgia, unspecified joint  Chronic  Discussed concern with 2 NSAIDs. Pt said she will trial diclofenac for her joint pain. Aware of renal risk with combining NSAIDS.   Given checking labs prior to CT scan will evaluate for inflammatory cause. Pt may benefit from in person evaluation of joint in the future.   - diclofenac (VOLTAREN) 75 MG EC tablet; Take 1 tablet by mouth daily as needed for Pain  Dispense: 60 tablet; Refill: 0  - RHEUMATOID FACTOR; Future  - RBC SEDIMENTATION RATE; Future  - C-REACTIVE PROTEIN; Future    2. Lung nodules  3. Swelling of neck/lump  Imaging has been cancelled/postponed due to COVID.  Pt will call to reschedule CT scan and  neck US. Will check renal and thyroid function prior to imaging. Pt will call [facility] to schedule.  - COLLECTION VENOUS BLOOD VENIPUNCTURE; Future  - CBC WITH PLATELET; Future  - COMPREHENSIVE METABOLIC PANEL; Future  - THYROID SCREEN TSH REFLEX FT4; Future    3. Tobacco use disorder  Pt pre-contemplative about quitting. <48min in counseling    We discussed the patients current medications. The patient expressed understanding and no barriers to adherence were identified.  Fredirick Lathe, MD 05/19/2019    Screening for possible COVID:    1. Does the patient have cough, shortness of breath, or runny nose, including chronic conditions? No  2. In the past 14 days, has the patient:  a. had new fever, cough, shortness of breath, body aches, runny nose, sore throat, nausea, diarrhea, red eye, or lack of smell/taste? OR  No   b. has the patient had COVID or suspected COVID?   No

## 2019-05-21 ENCOUNTER — Other Ambulatory Visit: Payer: Self-pay

## 2019-05-21 ENCOUNTER — Telehealth (HOSPITAL_BASED_OUTPATIENT_CLINIC_OR_DEPARTMENT_OTHER): Payer: Self-pay | Admitting: Ambulatory Care

## 2019-05-21 DIAGNOSIS — R221 Localized swelling, mass and lump, neck: Secondary | ICD-10-CM

## 2019-05-21 NOTE — Progress Notes (Signed)
New [place] order placed

## 2019-05-21 NOTE — Progress Notes (Signed)
Called and let patient know and gave her number to call scheduling at [phone]

## 2019-05-21 NOTE — Telephone Encounter (Signed)
-----   Message from [facility] sent at 05/21/2019  1:33 PM EDT -----  Regarding: Requesting Phone Call - ORDERS  Contact: [place] [phone], 73 year old, female    Calls today:  Clinical Questions ([place])    Name of person calling Patient  Specific nature of request Requesting phone call regarding [place] - pt states that she was supposed to have one scheduled but it was cancelled and now the [place] needs to be reordered.  Return phone number [phone]    Patient's language of care: English    Patient does not need an interpreter.    Patient's PCP: Fredirick Lathe, MD

## 2019-05-28 ENCOUNTER — Other Ambulatory Visit: Payer: Self-pay

## 2019-05-28 ENCOUNTER — Ambulatory Visit
Admission: RE | Admit: 2019-05-28 | Discharge: 2019-05-28 | Disposition: A | Discharge status: Home / Self Care | Payer: No Typology Code available for payment source | Attending: Family Medicine | Admitting: Family Medicine

## 2019-05-28 DIAGNOSIS — M2550 Pain in unspecified joint: Secondary | ICD-10-CM | POA: Insufficient documentation

## 2019-05-28 DIAGNOSIS — R918 Other nonspecific abnormal finding of lung field: Secondary | ICD-10-CM | POA: Diagnosis present

## 2019-05-28 LAB — CBC WITH PLATELET
ABSOLUTE NRBC COUNT: 0 10*3/uL (ref 0.0–0.0)
HEMATOCRIT: 41.4 % (ref 34.1–44.9)
HEMOGLOBIN: 13.7 g/dL (ref 11.2–15.7)
MEAN CORP HGB CONC: 33.1 g/dL (ref 31.0–37.0)
MEAN CORPUSCULAR HGB: 28.8 pg (ref 26.0–34.0)
MEAN CORPUSCULAR VOL: 87.2 fl (ref 80.0–100.0)
MEAN PLATELET VOLUME: 11.6 fL (ref 8.7–12.5)
NRBC %: 0 % (ref 0.0–0.0)
PLATELET COUNT: 158 10*3/uL (ref 150–400)
RBC DISTRIBUTION WIDTH STD DEV: 40.8 fL (ref 35.1–46.3)
RED BLOOD CELL COUNT: 4.75 M/uL (ref 3.90–5.20)
WHITE BLOOD CELL COUNT: 6 10*3/uL (ref 4.0–11.0)

## 2019-05-28 LAB — RBC SEDIMENTATION RATE: RBC SEDIMENTATION RATE: 11 MM/HR (ref 0–30)

## 2019-05-29 LAB — COMPREHENSIVE METABOLIC PANEL
ALANINE AMINOTRANSFERASE: 16 U/L (ref 12–45)
ALBUMIN: 3.6 g/dL (ref 3.4–5.0)
ALKALINE PHOSPHATASE: 74 U/L (ref 45–117)
ANION GAP: 5 mmol/L (ref 5–15)
ASPARTATE AMINOTRANSFERASE: 12 U/L (ref 8–34)
BILIRUBIN TOTAL: 0.5 mg/dL (ref 0.2–1.0)
BUN (UREA NITROGEN): 15 mg/dL (ref 7–18)
CALCIUM: 8.2 mg/dL — ABNORMAL LOW (ref 8.5–10.1)
CARBON DIOXIDE: 29 mmol/L (ref 21–32)
CHLORIDE: 109 mmol/L — ABNORMAL HIGH (ref 98–107)
CREATININE: 0.7 mg/dL (ref 0.4–1.2)
ESTIMATED GLOMERULAR FILT RATE: >60 mL/min (ref >60)
Glucose Random: 85 mg/dL (ref 74–160)
POTASSIUM: 3.9 mmol/L (ref 3.5–5.1)
SODIUM: 143 mmol/L (ref 136–145)
TOTAL PROTEIN: 6.6 g/dL (ref 6.4–8.2)

## 2019-05-29 LAB — C-REACTIVE PROTEIN: C-REACTIVE PROTEIN: <0.3 mg/dL (ref 0.0–1.8)

## 2019-05-29 LAB — RHEUMATOID FACTOR TITER: RHEUMATOID FACTOR TITER: 1:8 {titer}

## 2019-05-29 LAB — RHEUMATOID FACTOR: RHEUMATOID FACTOR: POSITIVE — AB

## 2019-05-29 LAB — FREE THYROXINE: FREE THYROXINE: 1.09 ng/dL (ref 0.76–1.46)

## 2019-05-29 LAB — THYROID SCREEN TSH REFLEX FT4: THYROID SCREEN TSH REFLEX FT4: 5.94 u[IU]/mL — ABNORMAL HIGH (ref 0.358–3.740)

## 2019-05-30 LAB — CYCLIC CITRULLIN PEPTIDE IGG: CYCLIC CITRULLIN PEPTIDE IgG: 1.3 U/mL (ref 0.0–2.9)

## 2019-05-30 NOTE — Addendum Note (Signed)
Addended by: Fredirick Lathe on: 05/30/2019 08:52 AM     Modules accepted: Orders

## 2019-06-13 ENCOUNTER — Ambulatory Visit (HOSPITAL_BASED_OUTPATIENT_CLINIC_OR_DEPARTMENT_OTHER)
Admission: RE | Admit: 2019-06-13 | Discharge: 2019-06-13 | Disposition: A | Discharge status: Home / Self Care | Payer: No Typology Code available for payment source | Source: Ambulatory Visit

## 2019-06-13 ENCOUNTER — Ambulatory Visit
Admission: RE | Admit: 2019-06-13 | Discharge: 2019-06-13 | Disposition: A | Discharge status: Home / Self Care | Payer: No Typology Code available for payment source | Attending: Family Medicine | Admitting: Family Medicine

## 2019-06-13 ENCOUNTER — Other Ambulatory Visit: Payer: Self-pay

## 2019-06-13 DIAGNOSIS — Z87891 Personal history of nicotine dependence: Secondary | ICD-10-CM

## 2019-06-13 DIAGNOSIS — R221 Localized swelling, mass and lump, neck: Secondary | ICD-10-CM | POA: Diagnosis not present

## 2019-06-13 DIAGNOSIS — R918 Other nonspecific abnormal finding of lung field: Secondary | ICD-10-CM | POA: Diagnosis present

## 2019-06-18 ENCOUNTER — Telehealth (HOSPITAL_BASED_OUTPATIENT_CLINIC_OR_DEPARTMENT_OTHER): Payer: Self-pay | Admitting: Family Medicine

## 2019-06-18 DIAGNOSIS — M2550 Pain in unspecified joint: Secondary | ICD-10-CM | POA: Insufficient documentation

## 2019-06-18 DIAGNOSIS — R918 Other nonspecific abnormal finding of lung field: Secondary | ICD-10-CM

## 2019-06-18 NOTE — Progress Notes (Signed)
Called patient    CT lung - stable. LDCT next year    US neck- Left side- no mass or lymphadenopathy     Pt feeling swelling on R side up to ear and head- front of head not back of head or neck. Pt is taking amoxicillin. Pt feels like it is helping and swelling improves but it comes back. Feels there is a bump under tongue.     Pt is wondering if she needs an MRI or amoxicillin. Declined both. Recommend pt be evaluated at urgent care or call for visit here when she has swelling to decide on treatment.    She will be out of town starting tomorrow until Sept 21. Declines urgent care today but will call when she is back.     Elevated RF on labs with joint pain. Pt agrees to rheumatology referral. [facility]

## 2019-06-18 NOTE — Progress Notes (Signed)
Pt informed by phone. See telephone encounter

## 2019-07-13 ENCOUNTER — Other Ambulatory Visit (HOSPITAL_BASED_OUTPATIENT_CLINIC_OR_DEPARTMENT_OTHER): Payer: Self-pay | Admitting: Family Medicine

## 2019-07-13 DIAGNOSIS — M2550 Pain in unspecified joint: Secondary | ICD-10-CM

## 2019-07-13 NOTE — Progress Notes (Unsigned)
PER Pharmacy, Victoria Macdonald is a 73 year old female has requested a refill of Diclofenac 75 mg.      Last Office Visit: 05-19-2019 with PCP  Last Physical Exam: 09-27-2017    There are no preventive care reminders to display for this patient.    Other Med Adult:  Most Recent BP Reading(s)  12/30/18 : 120/60        Cholesterol (mg/dL)   Date Value   03/04/2015 235     LOW DENSITY LIPOPROTEIN DIRECT (mg/dL)   Date Value   03/04/2015 171     HIGH DENSITY LIPOPROTEIN (mg/dL)   Date Value   03/04/2015 45     No results found for: TG      THYROID SCREEN TSH REFLEX FT4 (uIU/mL)   Date Value   05/28/2019 5.940 (H)         No results found for: TSH    No results found for: HGBA1C    No results found for: POCA1C      INR (no units)   Date Value   04/30/2018 1.0       SODIUM (mmol/L)   Date Value   05/28/2019 143       POTASSIUM (mmol/L)   Date Value   05/28/2019 3.9           CREATININE (mg/dL)   Date Value   05/28/2019 0.7       Documented patient preferred pharmacies:    [id number] [place] [place] - [place]  Phone: [phone] Fax: [phone]

## 2019-07-21 ENCOUNTER — Other Ambulatory Visit (HOSPITAL_BASED_OUTPATIENT_CLINIC_OR_DEPARTMENT_OTHER): Payer: Self-pay | Admitting: Obstetrics & Gynecology

## 2019-07-21 NOTE — Progress Notes (Signed)
med has been dsicontinued     Review of Systems  Physical Exam

## 2019-07-30 ENCOUNTER — Other Ambulatory Visit (HOSPITAL_BASED_OUTPATIENT_CLINIC_OR_DEPARTMENT_OTHER): Payer: Self-pay | Admitting: Family Medicine

## 2019-07-30 DIAGNOSIS — R0981 Nasal congestion: Secondary | ICD-10-CM

## 2019-07-30 MED ORDER — MONTELUKAST SODIUM 10 MG PO TABS
ORAL_TABLET | ORAL | 3 refills | Status: DC
Start: 2019-07-30 — End: 2021-03-02

## 2019-07-30 NOTE — Progress Notes (Signed)
PER Pharmacy, Victoria Macdonald is a 73 year old female has requested a refill of     SINGULAIR .      Last Office Visit: 05/19/2019   with PCP   Last Physical Exam: 09/27/17    There are no preventive care reminders to display for this patient.    Other Med Adult:  Most Recent BP Reading(s)  12/30/18 : 120/60        Cholesterol (mg/dL)   Date Value   03/04/2015 235     LOW DENSITY LIPOPROTEIN DIRECT (mg/dL)   Date Value   03/04/2015 171     HIGH DENSITY LIPOPROTEIN (mg/dL)   Date Value   03/04/2015 45     No results found for: TG      THYROID SCREEN TSH REFLEX FT4 (uIU/mL)   Date Value   05/28/2019 5.940 (H)         No results found for: TSH    No results found for: HGBA1C    No results found for: POCA1C      INR (no units)   Date Value   04/30/2018 1.0       SODIUM (mmol/L)   Date Value   05/28/2019 143       POTASSIUM (mmol/L)   Date Value   05/28/2019 3.9           CREATININE (mg/dL)   Date Value   05/28/2019 0.7       Documented patient preferred pharmacies:    [id number] [place], [place] - [place]  Phone: [phone] Fax: [phone]

## 2019-07-31 ENCOUNTER — Other Ambulatory Visit (HOSPITAL_BASED_OUTPATIENT_CLINIC_OR_DEPARTMENT_OTHER): Payer: Self-pay | Admitting: Family Medicine

## 2019-07-31 DIAGNOSIS — J310 Chronic rhinitis: Secondary | ICD-10-CM

## 2019-07-31 MED ORDER — FLUTICASONE PROPIONATE 50 MCG/ACT NA SUSP
1.0000 | Freq: Every day | NASAL | 1 refills | Status: DC
Start: 2019-07-31 — End: 2019-10-01

## 2019-07-31 NOTE — Progress Notes (Signed)
PER Patient (self), Victoria Macdonald is a 73 year old female has requested a refill of      -  Fluticasone       Last Office Visit: 05/19/2019 with D'Agata, C  Last Physical Exam: 09/27/2017     There are no preventive care reminders to display for this patient.     Other Med Adult:  Most Recent BP Reading(s)  12/30/18 : 120/60        Cholesterol (mg/dL)   Date Value   03/04/2015 235     LOW DENSITY LIPOPROTEIN DIRECT (mg/dL)   Date Value   03/04/2015 171     HIGH DENSITY LIPOPROTEIN (mg/dL)   Date Value   03/04/2015 45     No results found for: TG      THYROID SCREEN TSH REFLEX FT4 (uIU/mL)   Date Value   05/28/2019 5.940 (H)         No results found for: TSH    No results found for: HGBA1C    No results found for: POCA1C      INR (no units)   Date Value   04/30/2018 1.0       SODIUM (mmol/L)   Date Value   05/28/2019 143       POTASSIUM (mmol/L)   Date Value   05/28/2019 3.9           CREATININE (mg/dL)   Date Value   05/28/2019 0.7        Documented patient preferred pharmacies:    [id number] [place], [place] - [place]  Phone: [phone] Fax: [phone]

## 2019-08-28 ENCOUNTER — Other Ambulatory Visit (HOSPITAL_BASED_OUTPATIENT_CLINIC_OR_DEPARTMENT_OTHER): Payer: Self-pay | Admitting: Family Medicine

## 2019-08-28 DIAGNOSIS — J310 Chronic rhinitis: Secondary | ICD-10-CM

## 2019-09-06 ENCOUNTER — Other Ambulatory Visit (HOSPITAL_BASED_OUTPATIENT_CLINIC_OR_DEPARTMENT_OTHER): Payer: Self-pay | Admitting: Family Medicine

## 2019-09-06 NOTE — Telephone Encounter (Signed)
PER Pharmacy, Victoria Macdonald is a 73 year old female has requested a refill of      -  Fexofenadine       Last Office Visit: 05/19/2019 with D'Agata, C  Last Physical Exam: 10/17/2017     There are no preventive care reminders to display for this patient.     Other Med Adult:  Most Recent BP Reading(s)  12/30/18 : 120/60        Cholesterol (mg/dL)   Date Value   03/04/2015 235     LOW DENSITY LIPOPROTEIN DIRECT (mg/dL)   Date Value   03/04/2015 171     HIGH DENSITY LIPOPROTEIN (mg/dL)   Date Value   03/04/2015 45     No results found for: TG      THYROID SCREEN TSH REFLEX FT4 (uIU/mL)   Date Value   05/28/2019 5.940 (H)         No results found for: TSH    No results found for: HGBA1C    No results found for: POCA1C      INR (no units)   Date Value   04/30/2018 1.0       SODIUM (mmol/L)   Date Value   05/28/2019 143       POTASSIUM (mmol/L)   Date Value   05/28/2019 3.9           CREATININE (mg/dL)   Date Value   05/28/2019 0.7        Documented patient preferred pharmacies:    [id number] [place], [place] - [place]  Phone: [phone] Fax: [phone]

## 2019-09-09 ENCOUNTER — Other Ambulatory Visit (HOSPITAL_BASED_OUTPATIENT_CLINIC_OR_DEPARTMENT_OTHER): Payer: Self-pay | Admitting: Obstetrics & Gynecology

## 2019-09-10 NOTE — Telephone Encounter (Signed)
PER Pharmacy, Sachi Boulay is a 73 year old female has requested a refill of Famotidine.    Last Office Visit: 05/19/19    Other Med Adult:  Most Recent BP Reading(s)  12/30/18 : 120/60        Cholesterol (mg/dL)   Date Value   03/04/2015 235     LOW DENSITY LIPOPROTEIN DIRECT (mg/dL)   Date Value   03/04/2015 171     HIGH DENSITY LIPOPROTEIN (mg/dL)   Date Value   03/04/2015 45     No results found for: TG      THYROID SCREEN TSH REFLEX FT4 (uIU/mL)   Date Value   05/28/2019 5.940 (H)         No results found for: TSH    No results found for: HGBA1C    No results found for: POCA1C      INR (no units)   Date Value   04/30/2018 1.0       SODIUM (mmol/L)   Date Value   05/28/2019 143       POTASSIUM (mmol/L)   Date Value   05/28/2019 3.9           CREATININE (mg/dL)   Date Value   05/28/2019 0.7       Documented patient preferred pharmacies:    [id number] [place], [place] - [place]  Phone: [phone] Fax: [phone]

## 2019-09-10 NOTE — Telephone Encounter (Signed)
Pt was doing well with GERD off medications  Refilled for PRN use

## 2019-10-01 ENCOUNTER — Other Ambulatory Visit (HOSPITAL_BASED_OUTPATIENT_CLINIC_OR_DEPARTMENT_OTHER): Payer: Self-pay | Admitting: Family Medicine

## 2019-10-01 DIAGNOSIS — J310 Chronic rhinitis: Secondary | ICD-10-CM

## 2019-10-01 NOTE — Telephone Encounter (Signed)
PER Pharmacy, Victoria Macdonald is a 73 year old female has requested a refill of fluticasone (FLONASE) 50 MCG/ACT nasal spray .      Last tel. Visit: 05/19/2019 with D'Agata,C  Last Physical Exam: 09/27/2017    There are no preventive care reminders to display for this patient.    Other Med Adult:  Most Recent BP Reading(s)  12/30/18 : 120/60        Cholesterol (mg/dL)   Date Value   03/04/2015 235     LOW DENSITY LIPOPROTEIN DIRECT (mg/dL)   Date Value   03/04/2015 171     HIGH DENSITY LIPOPROTEIN (mg/dL)   Date Value   03/04/2015 45     No results found for: TG      THYROID SCREEN TSH REFLEX FT4 (uIU/mL)   Date Value   05/28/2019 5.940 (H)         No results found for: TSH    No results found for: HGBA1C    No results found for: POCA1C      INR (no units)   Date Value   04/30/2018 1.0       SODIUM (mmol/L)   Date Value   05/28/2019 143       POTASSIUM (mmol/L)   Date Value   05/28/2019 3.9           CREATININE (mg/dL)   Date Value   05/28/2019 0.7       Documented patient preferred pharmacies:    [id number] [place], [place] - [place]  Phone: [phone] Fax: [phone]

## 2019-10-27 ENCOUNTER — Ambulatory Visit (HOSPITAL_BASED_OUTPATIENT_CLINIC_OR_DEPARTMENT_OTHER): Payer: Self-pay | Admitting: Internal Medicine

## 2019-10-28 ENCOUNTER — Other Ambulatory Visit: Payer: Self-pay

## 2019-10-28 ENCOUNTER — Ambulatory Visit: Payer: No Typology Code available for payment source | Attending: Internal Medicine | Admitting: Internal Medicine

## 2019-10-28 ENCOUNTER — Encounter (HOSPITAL_BASED_OUTPATIENT_CLINIC_OR_DEPARTMENT_OTHER): Payer: Self-pay | Admitting: Internal Medicine

## 2019-10-28 VITALS — BP 128/81 | HR 86 | Temp 97.8°F | Resp 16 | Ht 60.0 in | Wt 148.0 lb

## 2019-10-28 DIAGNOSIS — M25562 Pain in left knee: Secondary | ICD-10-CM | POA: Diagnosis present

## 2019-10-28 DIAGNOSIS — M7542 Impingement syndrome of left shoulder: Secondary | ICD-10-CM | POA: Diagnosis not present

## 2019-10-28 DIAGNOSIS — M25561 Pain in right knee: Secondary | ICD-10-CM | POA: Diagnosis not present

## 2019-10-28 DIAGNOSIS — G8929 Other chronic pain: Secondary | ICD-10-CM

## 2019-10-28 DIAGNOSIS — R768 Other specified abnormal immunological findings in serum: Secondary | ICD-10-CM | POA: Diagnosis not present

## 2019-10-28 DIAGNOSIS — M7062 Trochanteric bursitis, left hip: Secondary | ICD-10-CM | POA: Diagnosis present

## 2019-10-28 MED ORDER — DICLOFENAC SODIUM 1 % EX GEL
2.0000 g | Freq: Four times a day (QID) | CUTANEOUS | 3 refills | Status: DC | PRN
Start: 2019-10-28 — End: 2020-02-16

## 2019-10-28 NOTE — Addendum Note (Signed)
Addended by: Deliah Goody on: 10/28/2019 04:42 PM     Modules accepted: Orders, Level of Service

## 2019-10-28 NOTE — Patient Instructions (Addendum)
Iliotibial band syndrome  Hip bursitis  Foam rolling  Stretches- ilitibial band, hip bursitis, knee pain      TRIGGER POINT RELEASE and MYOFASCIAL RELEASE for muscle pain:    Can use these tools: FOAM ROLLING, LACROSSE BALL, MEDICINE BALL    Look on Google images, Google video and YouTube for examples of exercises using the key words:   "myofascial release", "foam rolling", lacrosse ball rolling", "trigger point release"   -Lacrosse balls or massage balls or yoga balls - can use one, two inside of a sock, or two that are attached.      There are many brands.  No need to spend a lot of money.  You can buy at sport stores, [employer] or online.

## 2019-10-28 NOTE — Progress Notes (Signed)
[facility]  Rheumatology New Patient Note    Date of visit: 10/28/2019    Patient: Victoria Macdonald  Date of birth: 02-26-1946  Age: 74 year old  Gender as per [place]  PCP: Fredirick Lathe, MD  Primary language: [place]    Reason for referral: 06/18/19- 85 year old   With joint pain in hands, hips and shoulders   RF elevated but CCP and ESR normal     Chief complaint:  Victoria Macdonald is a 74 year old [place] speaking female who presents with complaints of rheumatoid factor and joint.    History of present illness:  I had the pleasure of meeting Victoria Macdonald who is a 74 year old [place] speaking female rheumatoid factor and joint pain.    She describes intermittent left shoulder pain.  20 years ago she was doing heavy lifting and developed tingling and pain in her arms that lasted for about 2 years.  She has a history of left-sided adhesive capsulitis.  She has had 2 glucocorticoid injections, the most recent one in 2018.  Left shoulder pain comes and goes and is not present on a daily basis.    3 years, she has had intermittent, bilateral knee pain, more prominent on the left.  Occasional mild swelling.  Many years ago she had significant swelling in the left knee but did not have an arthrocentesis.  She has not had a glucocorticoid injection.    She describes bilateral lateral hip discomfort for many years, more prominent on the left.  No morning stiffness.  X-rays and July 2019 were normal.  Pain occurs when she stands or when she is carrying a heavy bag on her shoulder.    She denies back pain, numbness and tingling in the legs, weakness and falls.  She was having cramps in her legs that improved with magnesium supplementation.  She takes acetaminophen 500 mg at times which is ineffective.  When she takes diclofenac 75 mg it helps with her pain.    She has foot swelling only at the end  of the day.    She has rare Raynaud's that she believes began in her 50s.  No digital ulcerations.  Denies dysphagia, rashes, digital ulcers, chest pain, dyspnea, cough, unintentional weight loss.    Since cataract surgery she has had dry eyes.    Labs show previous exposure to hepatitis B and hepatitis C.  Viral load undetectable for both.  Hepatitis A virus antibody is positive.    She was found to have a rheumatoid factor 1: 8 0.  CCP negative.    She is stable groundglass nodules in the lungs..  No pulmonary symptoms.  Has smoked since the age of 53.      Past medical history:  Tobacco use: Now smoking half a pack of cigarettes a day.  Has smoked since the age of 65.  Stable pulmonary nodules  Osteoporosis  Anxiety  Rheumatoid factor +    GI:  Colonic polyp  GERD  Hepatitis C virus antibody (undetectable viral load)  Hepatitis B core antibody, E antibody and surface antibody-previous infection   (undetectable viral load)  History of H. pylori infection  Possible fatty liver by imaging  Splenic cyst    Past surgical history:  Past Surgical History:  ~1985: ABDOMINAL SURGERY  09/02/27: BELPHAROPTOSIS REPAIR; Bilateral      Comment:  S/p bilateral ELA, lateral direct  brow lift and excision               of xanthelasmas with full thickness skin grafts nasal                bridge bilaterally 09/11/2017  03/2017: CATARACT EXTRACTION, BILATERAL; Bilateral      Comment:  s/p cataract extraction with PC IOL OU, at [facility], Dr.   03/2017: PB - PB - AFTER CATARACT LASER SURGERY; Bilateral      Comment:  s/p yag laser capsulotomy OU at [facility]  ~1975: TUBAL LIGATION    Allergies:  Review of Patient's Allergies indicates:   Lactose intolerance*    Other (See Comments)    Comment:bloating    Medications:  Current Outpatient Medications   Medication Sig    fluticasone (FLONASE) 50 MCG/ACT nasal spray SPRAY 1 SPRAY INTO EACH NOSTRIL EVERY DAY    famotidine (PEPCID) 20 MG tablet TAKE 1 TABLET BY MOUTH TWICE A DAY AS NEEDED     fexofenadine (ALLEGRA) 180 MG tablet TAKE 1 TABLET BY MOUTH EVERY DAY    montelukast (SINGULAIR) 10 MG tablet TAKE 1 TABLET BY MOUTH EVERY DAY AT NIGHT    diclofenac (VOLTAREN) 75 MG EC tablet TAKE 1 TABLET BY MOUTH EVERY DAY AS NEEDED FOR PAIN    alendronate (FOSAMAX) 70 MG tablet WITH FULL GLASS OF WATER ON EMPTY STOMACH-NOTHING ELSE BY MOUTH AND DO NOT LIE DOWN FOR 1/2 HOUR    carboxymethylcellulose (REFRESH TEARS) 0.5 % SOLN Place 1 drop into both eyes 4 (four) times daily    artificial tears (LUBRIFRESH P.M.) OINT Apply to eye     No current facility-administered medications for this visit.       Hand dominance: Right    Social history:  Social History     Socioeconomic History    Marital status: Single     Spouse name: Not on file    Number of children: Not on file    Years of education: Not on file    Highest education level: Not on file   Occupational History    Not on file   Social Needs    Financial resource strain: Not on file    Food insecurity     Worry: Not on file     Inability: Not on file    Transportation needs     Medical: Not on file     Non-medical: Not on file   Tobacco Use    Smoking status: Current Every Day Smoker     Packs/day: 0.25     Years: 53.00     Pack years: 13.25    Smokeless tobacco: Never Used    Tobacco comment: currently smoking 5/day   Substance and Sexual Activity    Alcohol use: No     Alcohol/week: 0.0 standard drinks    Drug use: No    Sexual activity: Not Currently   Lifestyle    Physical activity     Days per week: Not on file     Minutes per session: Not on file    Stress: Not on file   Relationships    Social connections     Talks on phone: Not on file     Gets together: Not on file     Attends religious service: Not on file     Active member of club or organization: Not on file     Attends meetings of clubs or organizations: Not on file  Relationship status: Not on file    Intimate partner violence     Fear of current or ex partner: Not on  file     Emotionally abused: Not on file     Physically abused: Not on file     Forced sexual activity: Not on file   Other Topics Concern    Not on file   Social History Narrative    2018    Lives alone    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 Was eating shake in the past     Exercise- walking a little     Wears her seatbelt    Denies safety concerns    Faith is important to her   She smokes half a pack cigarettes daily.  She has smoked since the age of 86.  Denies alcohol use.  Performs her ADLs.  From [place].    Sexual HX:  Female partners-2    Gynecologic history:  G3 P2 1 miscarriage  Menarche at age 35.    Family History:  Adopted-no    Review of patient's family history indicates:  Problem: Cancer - Other      Relation: Sister          Age of Onset: (Not Specified)          Comment: Uterus, ovarian, intestinal, liver  Problem: Glaucoma      Relation: Brother          Age of Onset: (Not Specified)  Problem: OTHER      Relation: Sister          Age of Onset: (Not Specified)          Comment: Dementia    Pertinent rheumatic family history:    Denies known family history of the following:  Systemic lupus erythematosus  Mixed connective tissue disorder  Rheumatoid arthritis  Rheumatism  Osteoarthritis  Gout  Pseudogout  Psoriasis  Psoriatic arthritis  Reactive arthritis  Ankylosing spondylitis  Crohn's disease  Ulcerative colitis  Thyroid disease  Hip fracture  Osteoporosis  Fibromyalgia syndrome  Dermatomyositis  Polymyositis  Inclusion body myositis  Multiple sclerosis  Vasculitis  Giant cell arteritis/temporal arteritis  Polymyalgia rheumatica  Behet's disease  Scleroderma  Sarcoidosis  Sjogren's syndrome  Autoimmune hearing loss  Relapsing polychondritis  Periodic fever syndrome    ROS:  Constitutional, Eyes, ENT/Mouth, Cardiovascular, Respiratory, GI, GU, Neuro, Psych,  Heme/Lymph, Skin, Musculoskeletal, and Endocrine systems were reviewed and are NEGATIVE, except for what is documented in the note.    The patient filled out a new patient intake form to the best of their ability that includes PMHx, PSHx, family Hx, sexual Hx, gynecological Hx (for women), social history (includes habits, sleep, occupation, and hand dominance), health maintenance questions and a full review of systems.  This form has been scanned into the electronic medical record.  Please refer to it in conjunction with the new patient evaluation office visit note.    PE:   10/28/19  1027   BP: 128/81   Site: Left Arm   Position: Sitting   Cuff Size: Regular   Pulse: 86   Resp: 16   Temp: 97.8 F (36.6 C)   TempSrc: Temporal   SpO2: 97%   Weight: 67.1 kg (148 lb)   Height: 5' (1.524 m)     Body mass index is  28.9 kg/m.    GENERAL: NAD.  Alert and oriented. Good eye contact. Full affect. Follows commands appropriately.  HEENT: Atraumatic/normocephalic.  No scalp rash.  No scalp tenderness.  No malar rash.   NECK: Supple.  Lymph node evaluation: No cervical lymphadenopathy.     LUNGS: Clear to auscultation without wheezes, rhonchi or rales.  ABD: Soft. Non-tender.  EXTREMITIES: Well perfused. No edema. No clubbing.  SKIN: No rashes, nodules, bruising,  MUSCULOSKELETAL: No synovitis in the hands no tenderness or deformities in the fingers wrists or elbows.  Impingement on the left shoulder.  No restriction in range of motion the hips.  FADIR causes some discomfort on the lateral aspect of the left hip.  Tenderness over the left lateral hip/pelvis.  No swelling in the knees.  No ankle swelling.   No knee tenderness.  No Achille's swelling or tenderness.  There is no tenderness on palpation of the MTPs.    No dactylitis or splaying of the toes.  NEURO: Narrow-based gait.  No weakness.   No difficulty getting up from seated position without using arms for assistance or out of a supine position.    No assistive  ambulatory devices.    Laboratory data that I have personally reviewed and interpreted:  Creatinine 0.7, GFR greater than 60  CRP less than 0.3, ESR 11  CCP 1.3 negative  Rheumatoid factor 1: 8  WBC 6 hematocrit 41, platelets 158    Imaging and other studies that I have personally reviewed and interpreted:    ABD [place] 05/2018:Mildly heterogeneous liver echotexture. Small left hepatic lobe cyst.   2. Unremarkable Doppler interrogation of the hepatic vessels.   3. Splenic cyst.   4. 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    She has rheumatoid factor which is nonspecific.  The risk for having rheumatoid factor increases with age and can be seen with chronic infections of to hepatitis B and hepatitis C.  Given that she has undetectable viral load, is not clear if the rheumatoid factor can be attributed to these infections.    She does not have symmetric symptoms for involvement of the small joints of the hands and feet.  She has had knee swelling in the past but likely has osteoarthritis.  Does not appear to have rheumatoid arthritis.    She has left shoulder impingement.    She has left trochanteric pain syndrome.    Plan/Recommendations:  She is given reassurance that it does not appear that she has rheumatoid arthritis at this time.    She asks about acupuncture which has given her benefit in the past.  She was given a referral.    Referral for physical therapy of the left shoulder.    ------------------------------------------------------------------------------  PROCEDURE NOTE:  Procedure: Left trochanteric bursal glucocorticoid injection  Indication: Left trochanteric bursitis  Side: Left    Patient gave a verbal informed consent for cortisone injection  after discussing potential benefits and the risks of injections, including, but not limited to, infection, allergic reaction, bleeding, pain, nerve injury, paresis, tissue atrophy/local tissue breakdown, change in skin pigmentation and ineffectiveness and transient elevation in glucose level.  Alternative therapies were discussed.  Patient signed a consent form for procedure.    TIMEOUT:  PATIENT/PROCEDURE VERIFICATION DOCUMENTATION  Correct patient: Yes  Correct procedure: Yes  Correct site, mark visible if applicable: Yes    Pre-procedure Vital Signs:  Refer to vitals section of visit     Sterile technique was followed throughout the procedure.    The patient was sterilely prepped with chlorhexadine.  A topical skin refrigerant was used to numb the area.  80 mg of Depo-medrol and 4 cc of 1% lidocaine was infiltrated around the left greater trochanter after negative aspiration.   The patient tolerated the procedure without complications.  Post procedure care was discussed.  The patient will call with any issues.    MEDICATION DOSE VOLUME   Xylocaine 1% - epinephrine free      mg    for ml   Bupivicaine 0.5% - epinephrine free      mg      ml   Depo-medrol 51m/ml      mg      ml   Depo-medrol 428mml      mg      ml   Depo-medrol 8060ml      mg  1    ml   Kenalog 42m55m      mg      ml   Dexamethasone 10mg33m     mg      ml   Other:       mg      ml   ------------------------------------------------------------------------------  After the injection, she had improvement in her left hip pain.    We discussed stretching and foam rolling of the back, iliotibial band, quadriceps, hamstrings and calves.    Follow-up on as-needed basis.    Discussed wearing a mask, avoid touching face and eyes and frequent handwashing.     The patient was given an updated medication list as part of the end of visit summary upon checkout.    The patient's questions have been addressed and answered.     The patient verbalized an  understanding and agreement with this plan.    The patient was ready to learn and no apparent learning barriers were identified.     Victoria Rippleally expressed an understanding and agreement with the plan as outlined above.    Patient was given an updated medication list as part of the end of visit summary upon checkout.    Thank you for your kind referral.  I appreciate the opportunity to participate in Victoria Macdonald's care.  Please contact me with any questions that you may have.    The majority of this rheumatology note was created using voice recognition software.  There is potential for wrong word or sound like substitutions due to the inherent limitations of voice recognition software.  Please read the chart carefully and recognize, using context, where substitutions have occurred.  Please excuse any identified errors in spelling or syntax.  Every effort has been made to appropriately correct upon completion of the note.         Victoria LevelhapnLorane Macdonald MPH, FACRJulious Macdonald

## 2019-11-07 ENCOUNTER — Other Ambulatory Visit (HOSPITAL_BASED_OUTPATIENT_CLINIC_OR_DEPARTMENT_OTHER): Payer: Self-pay | Admitting: Family Medicine

## 2019-11-07 DIAGNOSIS — M2550 Pain in unspecified joint: Secondary | ICD-10-CM

## 2019-11-07 NOTE — Telephone Encounter (Signed)
PER Pharmacy, Victoria Macdonald is a 74 year old female has requested a refill of diclofenac.      Last Office Visit: 05/19/2019 with D'agata, C  Last Physical Exam: 09/27/2017    There are no preventive care reminders to display for this patient.    Other Med Adult:  Most Recent BP Reading(s)  10/28/19 : 128/81        Cholesterol (mg/dL)   Date Value   03/04/2015 235     LOW DENSITY LIPOPROTEIN DIRECT (mg/dL)   Date Value   03/04/2015 171     HIGH DENSITY LIPOPROTEIN (mg/dL)   Date Value   03/04/2015 45     No results found for: TG      THYROID SCREEN TSH REFLEX FT4 (uIU/mL)   Date Value   05/28/2019 5.940 (H)         No results found for: TSH    No results found for: HGBA1C    No results found for: POCA1C      INR (no units)   Date Value   04/30/2018 1.0       SODIUM (mmol/L)   Date Value   05/28/2019 143       POTASSIUM (mmol/L)   Date Value   05/28/2019 3.9           CREATININE (mg/dL)   Date Value   05/28/2019 0.7       Documented patient preferred pharmacies:    [id number] [place] [place] - [place]  Phone: [phone] Fax: [phone]

## 2019-12-05 ENCOUNTER — Other Ambulatory Visit (HOSPITAL_BASED_OUTPATIENT_CLINIC_OR_DEPARTMENT_OTHER): Payer: Self-pay | Admitting: Pediatrics

## 2019-12-12 ENCOUNTER — Telehealth (HOSPITAL_BASED_OUTPATIENT_CLINIC_OR_DEPARTMENT_OTHER): Payer: Self-pay | Admitting: Registered Nurse

## 2019-12-12 NOTE — Telephone Encounter (Signed)
-----   Message from Fosston Degree sent at 12/12/2019  4:15 PM EST -----  Regarding: Rx Request  Contact: [place] [phone], 74 year old, female    Calls today:  Clinical Questions ([place])    Name of person calling LINDA [facility] Nurse Case Manager)  Specific nature of request [name] is requesting a Rx for Pull-ups size Medium qty 60 per month to be faxed to [employer] Fax# [phone]  Return phone number [phone]  Person calling on behalf of patient: LINDA [facility] Nurse Case Manager)        Patient's language of care: English    Patient does not need an interpreter.    Patient's PCP: Fredirick Lathe, MD

## 2019-12-15 NOTE — Telephone Encounter (Signed)
Covering provider.     Unable to find diagnosis on problem list appropriate for diapers.     Left message for Victoria Macdonald [phone], to call back to the office to let us know what diagnosis code to put on the prescription because in chart review, I do not see any diagnoses that would qualify for pull-ups like urinary incontinence or overactive bladder, etc.     Waiting for call-back before doing Rx and letter of necessity.

## 2019-12-16 NOTE — Telephone Encounter (Signed)
DME requires notes from visit with condition was discussed. Pt needs a visit to discuss this.    Can use visits on March 1    Please schedule this patient for a televisit.    Reason for visit: Urinary symptoms  Schedule with: Referring provider only  Urgency: Televisit week March 1  If requested appointment not available within the requested time frame: Route chart back to referring provider    ** Providers should route this request to their home clinic front desk **

## 2019-12-16 NOTE — Telephone Encounter (Signed)
-----   Message from Vito Backers sent at 12/15/2019  2:58 PM EST -----  Regarding: [facility] returning call from Provider  Aubryanna Nesheim [phone], 74 year old, female    Calls today:  Clinical Questions ([place])    Name of person calling [facility]- [name]  Specific nature of request Returning call about pull ups, states it can be coded as urinary incontinence  Return phone number [phone]  Person calling on behalf of patient: [facility]      Patient's language of care: English    Patient does not need an interpreter.    Patient's PCP: Fredirick Lathe, MD

## 2019-12-20 ENCOUNTER — Other Ambulatory Visit (HOSPITAL_BASED_OUTPATIENT_CLINIC_OR_DEPARTMENT_OTHER): Payer: Self-pay | Admitting: Family Medicine

## 2019-12-20 NOTE — Telephone Encounter (Signed)
PER Pharmacy, Victoria Macdonald is a 74 year old female has requested a refill of                -  Famotidine 20mg .      Last Office Visit: 05/19/2019 with pcp  Last Physical Exam: 09/27/2017    COLONOSCOPY due on 02/27/2020    Other Med Adult:  Most Recent BP Reading(s)  10/28/19 : 128/81        Cholesterol (mg/dL)   Date Value   03/04/2015 235     LOW DENSITY LIPOPROTEIN DIRECT (mg/dL)   Date Value   03/04/2015 171     HIGH DENSITY LIPOPROTEIN (mg/dL)   Date Value   03/04/2015 45     No results found for: TG      THYROID SCREEN TSH REFLEX FT4 (uIU/mL)   Date Value   05/28/2019 5.940 (H)         No results found for: TSH    No results found for: HGBA1C    No results found for: POCA1C      INR (no units)   Date Value   04/30/2018 1.0       SODIUM (mmol/L)   Date Value   05/28/2019 143       POTASSIUM (mmol/L)   Date Value   05/28/2019 3.9           CREATININE (mg/dL)   Date Value   05/28/2019 0.7       Documented patient preferred pharmacies:    [id number] [address], [place]  Phone: [phone] Fax: [phone]

## 2019-12-22 ENCOUNTER — Other Ambulatory Visit (HOSPITAL_BASED_OUTPATIENT_CLINIC_OR_DEPARTMENT_OTHER): Payer: Self-pay | Admitting: Family Medicine

## 2019-12-22 ENCOUNTER — Ambulatory Visit: Payer: No Typology Code available for payment source | Attending: Family Medicine | Admitting: Family Medicine

## 2019-12-22 ENCOUNTER — Encounter (HOSPITAL_BASED_OUTPATIENT_CLINIC_OR_DEPARTMENT_OTHER): Payer: Self-pay | Admitting: Family Medicine

## 2019-12-22 DIAGNOSIS — K469 Unspecified abdominal hernia without obstruction or gangrene: Secondary | ICD-10-CM | POA: Diagnosis present

## 2019-12-22 DIAGNOSIS — K635 Polyp of colon: Secondary | ICD-10-CM | POA: Diagnosis present

## 2019-12-22 DIAGNOSIS — M810 Age-related osteoporosis without current pathological fracture: Secondary | ICD-10-CM | POA: Diagnosis not present

## 2019-12-22 DIAGNOSIS — K219 Gastro-esophageal reflux disease without esophagitis: Secondary | ICD-10-CM

## 2019-12-22 DIAGNOSIS — J310 Chronic rhinitis: Secondary | ICD-10-CM

## 2019-12-22 DIAGNOSIS — N3944 Nocturnal enuresis: Secondary | ICD-10-CM | POA: Insufficient documentation

## 2019-12-22 DIAGNOSIS — M25551 Pain in right hip: Secondary | ICD-10-CM

## 2019-12-22 DIAGNOSIS — F4321 Adjustment disorder with depressed mood: Secondary | ICD-10-CM | POA: Diagnosis present

## 2019-12-22 MED ORDER — DIAPERS & SUPPLIES MISC
2.00 | Freq: Every evening | 11 refills | Status: AC
Start: 2019-12-22 — End: 2020-12-21

## 2019-12-22 MED ORDER — DIAPERS & SUPPLIES MISC
2.0000 | Freq: Every evening | 11 refills | Status: DC
Start: 2019-12-22 — End: 2019-12-22

## 2019-12-22 NOTE — Progress Notes (Signed)
[facility] FAMILY MEDICINE  Telehealth Visit Note   Subjective:   CC: Victoria Macdonald is a 74 year old female patient who presents today for telehealth visit for Patient presents with:  Hip Pain  Incontinence  Depression     #Hip pain  Saw Dr. Lorane Gell- had injection in January  Walking causing L leg/hip pain   Pain improved. Yesterday a little bit of pain returned  Is having some finger pain- about 3 of them  Referred to PT and Acupuncturist - pt stills to call    #Abdominal hernia  The physician requested televisit  Pt wanted in person visit so     #Nocturia  Needs to get up at night  Waking with bed is wet  Received some pads from [facility]   Pt prefers pulls up instead  No rash from the pads  Has stress incontinence during the day- happened 1 time with gas  No stool incontinence at all  Thinks 2 pulls a night will help    # depressed and anxiety  Sometimes feeling down  Does not want to be on medication- had reflux  Worried about family- her granddaughter tested positive   Body pain makes mood worse  Not afraid of COVID or dying  Denies SI    #Osteoporosis  Pt stopped alendronate due to GI upset  Pt last DEXA in 2016- T score of -2.7  Has been on alendronate on and off since 2016      SH/PMH:  Pt stopped dairy from diet - pt tried lactose free milk. Does not like milk alternatives   COVID vaccine- does not want it. Had the option at her home building and declined. Does not believe in the vaccine.   Lives alone    ROS:  Per HPI         famotidine (PEPCID) 20 MG tablet, TAKE 1 TABLET BY MOUTH TWICE A DAY AS NEEDED, Disp: 180 tablet, Rfl: 1      diclofenac (VOLTAREN) 75 MG EC tablet, TAKE 1 TABLET BY MOUTH EVERY DAY AS NEEDED FOR PAIN, Disp: 60 tablet, Rfl: 1      diclofenac (VOLTAREN) 1 % GEL Gel, Apply 2 g topically 4 (four) times daily as needed, Disp: 300 g, Rfl: 3      fluticasone (FLONASE) 50 MCG/ACT nasal spray, SPRAY 1 SPRAY INTO EACH NOSTRIL EVERY DAY, Disp: 16 mL, Rfl: 1      [DISCONTINUED] famotidine  (PEPCID) 20 MG tablet, TAKE 1 TABLET BY MOUTH TWICE A DAY AS NEEDED, Disp: 120 tablet, Rfl: 1      fexofenadine (ALLEGRA) 180 MG tablet, TAKE 1 TABLET BY MOUTH EVERY DAY, Disp: 90 tablet, Rfl: 3      montelukast (SINGULAIR) 10 MG tablet, TAKE 1 TABLET BY MOUTH EVERY DAY AT NIGHT, Disp: 90 tablet, Rfl: 3      [DISCONTINUED] alendronate (FOSAMAX) 70 MG tablet, WITH FULL GLASS OF WATER ON EMPTY STOMACH-NOTHING ELSE BY MOUTH AND DO NOT LIE DOWN FOR 1/2 HOUR, Disp: 12 tablet, Rfl: 3      carboxymethylcellulose (REFRESH TEARS) 0.5 % SOLN, Place 1 drop into both eyes 4 (four) times daily, Disp: 1 Bottle, Rfl: 12      artificial tears (LUBRIFRESH P.M.) OINT, Apply to eye, Disp: , Rfl:     No current facility-administered medications on file prior to visit.     Objective:     Exam - vitals deferred in setting of COVID pandemic  Speaking in complete sentences, breathing  comfortably  Able to concentrate throughout visit    Assessment & Plan:     1. Abdominal hernia without obstruction and without gangrene, recurrence not specified, unspecified hernia type  Chronic. Pt had referral in 2019 but unable to complete in person in 2020 due to [place] referral written and number given to patient to schedule  - REFERRAL TO GENERAL SURGERY ( INT)    2. Gastroesophageal reflux disease without esophagitis  3. Sessile colonic polyp  Pt due for repeat scope this year. Referral written and pt to expect call from GI nurse  - EGD AND COLONOSCOPY; Future    4. Osteoporosis without current pathological fracture, unspecified osteoporosis type  Per DXA in 2016. Pt had intermittent tx over 5 years (likely total of at least 39yr of treatment) stopped due to GI upset. Repeat DXA. Pt may need to consider parental tx pending results.   - XR DXA BONE DENSITOMETRY; Future    5. Right hip pain  Improved with injection  Pt has follow up with Dr. Lorane Gell scheduled in April. Advised to keep in follow up    6. Urinary incontinence, nocturnal  enuresis  Lower concern for neurovascular etiology- no daytime incontinence or stool incontinence  Rx for diaper- pulls ups written as per 2/19 phone note  Letter of medical necessity also written    7. Adjustment disorder with depressed mood  Brief discussion today  Pt declines medication or therapy referral    Discussed COVID risk and vaccine and pt declining at this time but will consider    Pt declined scheduling follow up now. Will call pending sx    We discussed the patients current medications. The patient expressed understanding and no barriers to adherence were identified.  Fredirick Lathe, MD 12/22/2019     I spent a total of 39 minutes on this visit on the date of service (total time includes all activities performed on the date of service)             Audiology [phone]  Physiatry [phone]   [place] [phone]  Plastic Surgery [phone]   Cardiology [phone]  Podiatry [phone]   Dermatology [phone] option 3  Pulmonology [phone]   Endocrinology [phone]  Rheumatology [phone]   ENT/Allergy [phone]  Sports Medicine [phone]   Gastroenterology [phone]  TB Clinic [phone]   Infectious Diseases [phone]  Thoracic Surgery [phone]   Nephrology [phone]  Travel Clinic (18+) [phone]   Neurology/EMG [phone]  Urology [phone]   Occupational Health [phone]  Vascular Surgery [phone]   Orthopedics [phone]  [place] (Ophthalmology/Optometry)  [place] (Gyn/OB/Midwives)   [name] [phone]  [place] [phone]      [place]          Radiology [phone]  Cards/Pulm Testing [phone]           PT/OT/Speech     Assembly  [place]

## 2019-12-23 NOTE — Telephone Encounter (Signed)
No refill of this medication- Antibiotics should be used if infection is present which needs to be confirmed on exam. May be other causes of pain or an infection that needs and oral medication    Pt would need to be see to evaluate her ear. She can go to and urgent care or be seen here at [facility] if COVID screen is neg or [facility] if positive

## 2019-12-23 NOTE — Telephone Encounter (Signed)
PER Pharmacy, Victoria Macdonald is a 74 year old female has requested a refill of     FLONASE .      Last Office Visit: 12/22/2019   with PCP   Last Physical Exam: 09/27/17    COLONOSCOPY due on 02/27/2020    Other Med Adult:  Most Recent BP Reading(s)  10/28/19 : 128/81        Cholesterol (mg/dL)   Date Value   03/04/2015 235     LOW DENSITY LIPOPROTEIN DIRECT (mg/dL)   Date Value   03/04/2015 171     HIGH DENSITY LIPOPROTEIN (mg/dL)   Date Value   03/04/2015 45     No results found for: TG      THYROID SCREEN TSH REFLEX FT4 (uIU/mL)   Date Value   05/28/2019 5.940 (H)         No results found for: TSH    No results found for: HGBA1C    No results found for: POCA1C      INR (no units)   Date Value   04/30/2018 1.0       SODIUM (mmol/L)   Date Value   05/28/2019 143       POTASSIUM (mmol/L)   Date Value   05/28/2019 3.9           CREATININE (mg/dL)   Date Value   05/28/2019 0.7       Documented patient preferred pharmacies:    [id number] [address], [place] - [place]  Phone: [phone] Fax: [phone]

## 2019-12-23 NOTE — Telephone Encounter (Signed)
Called pt back and informed her as below :   No refill of this medication- Antibiotics should be used if infection is present which needs to be confirmed on exam. May be other causes of pain or an infection that needs and oral medication    Pt would need to be see to evaluate her ear. She can go to and urgent care or be seen here at [facility] if COVID screen is neg or [facility] if positive           Documentation      Pt refused to go UC or ACC and stated she will be fine.

## 2019-12-25 ENCOUNTER — Other Ambulatory Visit: Payer: Self-pay

## 2020-01-22 ENCOUNTER — Other Ambulatory Visit: Payer: Self-pay

## 2020-01-22 ENCOUNTER — Ambulatory Visit: Payer: No Typology Code available for payment source | Attending: Surgery | Admitting: Surgery

## 2020-01-22 VITALS — BP 109/57 | HR 88 | Temp 97.6°F

## 2020-01-22 DIAGNOSIS — K432 Incisional hernia without obstruction or gangrene: Secondary | ICD-10-CM | POA: Diagnosis present

## 2020-01-22 DIAGNOSIS — R222 Localized swelling, mass and lump, trunk: Secondary | ICD-10-CM | POA: Insufficient documentation

## 2020-01-22 NOTE — Progress Notes (Signed)
74 year old female who presents after numerous missed appointments secondary to the COVID-19 pandemic. I recently saw her in 2019 for an anterior abdominal wall hernia. This at that time was noted to be likely an incisional hernia as she had previous abdominal surgery in 1985. The surgery was performed in [place] and she subsequently went to [place] and is now back in the [place]. Overall she remains relatively asymptomatic aside from the bulging that she notices of the hernia. She once again states that the hernia has been there for over 3 decades and other than the lump does not cause any GI or GU disturbances.    Physical examination today limited to the abdomen reveals a soft obese abdomen with a well-healed midline incision with an anterior bulge likely secondary to the incisional hernia it is not readily reducible to the point where fascial edges are palpable however does recede on recumbency. There is a small possibility that this could also be a rectus diastases. At this point the next that we cross-sectional imaging however the patient declines any surgical intervention at this point and would like to continue to observe as she is relatively asymptomatic. We mutually agreed upon a 62-month serial follow-up with CAT scan at that time to assess and see if there is any enlarging defect that would warrant surgical intervention. She understands and agrees with this plan a follow-up appointment was made to this effect.    I spent a total of 32 minutes on this visit on the date of service (total time includes all activities performed on the date of service)

## 2020-01-28 ENCOUNTER — Ambulatory Visit: Payer: No Typology Code available for payment source | Attending: Internal Medicine | Admitting: Internal Medicine

## 2020-01-28 ENCOUNTER — Encounter (HOSPITAL_BASED_OUTPATIENT_CLINIC_OR_DEPARTMENT_OTHER): Payer: Self-pay | Admitting: Internal Medicine

## 2020-01-28 ENCOUNTER — Other Ambulatory Visit: Payer: Self-pay

## 2020-01-28 VITALS — BP 114/73 | HR 97 | Resp 18 | Ht 60.0 in | Wt 154.0 lb

## 2020-01-28 DIAGNOSIS — M7542 Impingement syndrome of left shoulder: Secondary | ICD-10-CM | POA: Insufficient documentation

## 2020-01-28 DIAGNOSIS — M7541 Impingement syndrome of right shoulder: Secondary | ICD-10-CM | POA: Insufficient documentation

## 2020-01-28 MED ORDER — TROLAMINE SALICYLATE 10 % EX CREA
TOPICAL_CREAM | Freq: Three times a day (TID) | CUTANEOUS | 0 refills | Status: AC
Start: 2020-01-28 — End: 2020-04-27

## 2020-01-28 NOTE — Progress Notes (Signed)
[facility]  Rheumatology Follow-up Patient Note    Date of visit: 01/28/2020    Patient: Victoria Macdonald  Date of birth: 02/24/1946  Age: 74 year old  Gender as per [place]  PCP: Fredirick Lathe, MD  Primary language: [place]  Last visit: 10/28/2019 (first visit)    Reason for Visit: Rheumatoid factor    HPI:  This is a follow-up visit for this 74 year old [place] speaking woman from [place] with history of hepatitis B virus, hepatitis C virus and rheumatoid factor.  She has osteoarthritis with a history of knee swelling, shoulder impingement and trochanteric bursitis.  Based on her history and physical examination, I felt that she did not have rheumatoid arthritis.  She was given a left-sided trochanteric bursal injection.  She was referred to physical therapy for left shoulder impingement.  This is her first follow-up visit.    Interim history:  Hip pain resolved with injection.  She still has left shoulder pain especially with overhead lifting.  She did not yet set up a physical therapy appointment.  She has noticed that she does have some left PIP discomfort at night immediate little bit of swelling warts harder to take her ring off.  Trolamine salicylate was ineffective.  She asks if I know of any talk therapist for her to go to.  Her sister lives in [place] and has chronic pain.  She would like her sister to see a rheumatologist.      Past medical history:  Tobacco use: Now smoking half a pack of cigarettes a day.  Has smoked since the age of 13.  Stable pulmonary nodules  Osteoporosis  Anxiety  Rheumatoid factor +    GI:  Colonic polyp  GERD  Hepatitis C virus antibody (undetectable viral load)  Hepatitis B core antibody, E antibody and surface antibody-previous infection   (undetectable viral load)  History of H. pylori infection  Possible fatty liver by imaging  Splenic cyst    Past surgical history:  1985:  ABDOMINAL SURGERY  09/02/27: BELPHAROPTOSIS REPAIR; Bilateral      Comment:  S/p bilateral ELA, lateral direct brow lift and excision               of xanthelasmas with full thickness skin grafts nasal                bridge bilaterally 09/11/2017  03/2017: CATARACT EXTRACTION, BILATERAL; Bilateral      Comment:  s/p cataract extraction with PC IOL OU, at [facility], Dr.   03/2017: PB - PB - AFTER CATARACT LASER SURGERY; Bilateral      Comment:  s/p yag laser capsulotomy OU at [facility]  ~1975: TUBAL LIGATION    Hand dominance: Right-handed    Allergies:  Review of Patient's Allergies indicates:   Lactose intolerance*    Other (See Comments)    Comment:bloating    Medications:  Current Outpatient Medications   Medication Sig    fluticasone (FLONASE) 50 MCG/ACT nasal spray SPRAY 1 SPRAY INTO EACH NOSTRIL EVERY DAY    famotidine (PEPCID) 20 MG tablet TAKE 1 TABLET BY MOUTH TWICE A DAY AS NEEDED    Diapers & Supplies MISC Use 2 Devices As Directed nightly Pull up diapers. Size Medium  Dx-N39.44    diclofenac (VOLTAREN) 75 MG EC tablet TAKE 1 TABLET BY MOUTH EVERY DAY AS NEEDED FOR PAIN    diclofenac (VOLTAREN) 1 % GEL Gel Apply 2 g topically 4 (  four) times daily as needed    fexofenadine (ALLEGRA) 180 MG tablet TAKE 1 TABLET BY MOUTH EVERY DAY    montelukast (SINGULAIR) 10 MG tablet TAKE 1 TABLET BY MOUTH EVERY DAY AT NIGHT    carboxymethylcellulose (REFRESH TEARS) 0.5 % SOLN Place 1 drop into both eyes 4 (four) times daily    artificial tears (LUBRIFRESH P.M.) OINT Apply to eye     No current facility-administered medications for this visit.     Social history:  She smokes half a pack cigarettes daily.  She has smoked since the age of 81.  Denies alcohol use.  Performs her ADLs.  From [place].    Gynecologic history:  G3 P2 1 miscarriage  Menarche at age 41.    Family History:  No history of rheumatoid arthritis    Review of systems:  Constitutional, Eyes, ENT/Mouth, Cardiovascular, Respiratory, GI, GU, Neuro, Psych,  Heme/Lymph, Skin, Musculoskeletal, and Endocrine systems were reviewed and are NEGATIVE, except for what is documented in the note.    PE:   01/28/20  1111   BP: 114/73   Site: Left Arm   Position: Sitting   Cuff Size: Regular   Pulse: 97   Resp: 18   SpO2: 96%   Weight: 69.9 kg (154 lb)   Height: 5' (1.524 m)     Body mass index is 30.08 kg/m.  General: No acute distress.  Alert and went.  Good eye contact.  Full affect.  HEENT: Anicteric.  No injection.  Musculoskeletal: No synovitis in the hands no tender joints.  She can make a fist.  Pain with elevation of left shoulder overhead.  Skin: No sclerodermatous changes.  Neuro: Steady gait.  No difficulty getting up and down from a seated position.    LABS that I personally reviewed today:    Creatinine 0.7, GFR greater than 60  AST 12, ALT 16  CRP less than 0.3  CCP negative  Rheumatoid factor 1: 8 0  WBC 6, hematocrit 41, platelets 158      Assessment:  74 year old woman with low value rheumatoid factor which may be a function of age.  She is knee osteoarthritis, trochanteric bursitis and shoulder impingement.  History of hepatitis B and C viral infections with undetectable viral loads.  She has some mild left hand pain.    RECS/PLAN:  Switch from diclofenac gel to trolamine salicylate and will see if it gives her some relief.      If she develops significant swelling or pain in the hand, she will let me know.    She was shown resistance band exercises and range of motion exercises for the left shoulder.  Ideally, she will make a physical therapy appointment.    I recommend that she call her insurance company and get a list of therapists in [place] who are covered and do some investigation and try to find one that she thinks she may connect with.    She was given the name of a colleague of mine in [place] for her sister to make appointment with.    She is hesitant to have COVID-19 vaccination.  I spent time discussing the benefits and risks with her.  I  recommend that she get vaccinated.    ------------------------------------------------------------------------------  PROCEDURE NOTE:  Procedure: Right shoulder subacromial bursal glucocorticoid injection  Indication: Right shoulder pain/impingement  Side: Right    Patient gave a verbal informed consent for cortisone injection after discussing potential benefits and the risks of injections,  including, but not limited to, infection, allergic reaction, bleeding, pain, nerve injury, paresis, tissue atrophy/local tissue breakdown, change in skin pigmentation and ineffectiveness and transient elevation in glucose level.  Alternative therapies were discussed.  Patient signed a consent form for procedure.    TIMEOUT: PATIENT/PROCEDURE VERIFICATION DOCUMENTATION  Correct patient: Yes  Correct procedure: Yes  Correct site, mark visible if applicable: Yes    Pre-procedure Vital Signs:  Refer to vitals section of visit     Sterile technique was followed throughout the procedure.    The patient was sterilely prepped with chlorhexadine.  A topical skin refrigerant was used to numb the area.  40 mg of Depo-medrol and 7 cc of 1% lidocaine was injected into the right subacromial bursa after negative aspiration.   The patient tolerated the procedure without complications.  Post procedure care was discussed.  The patient will call with any issues.    MEDICATION DOSE VOLUME   Xylocaine 1% - epinephrine free      mg    7  ml   Bupivicaine 0.5% - epinephrine free      mg      ml   Depo-medrol 20mg /ml      mg      ml   Depo-medrol 40mg /ml      mg    1  ml   Depo-medrol 80mg /ml      mg      ml   Kenalog 40mg /ml      mg      ml   Dexamethasone 10mg /ml      mg      ml   Other:       mg      ml   ------------------------------------------------------------------------------    I recommend Melynda Ripple follow up in approximately 12 weeks, earlier if needed.     The patient was given an updated medication list as part of the end of visit  summary.    The patient's questions have been addressed and answered.     The patient verbalized an understanding and agreement with this plan.    The patient was ready to learn and no apparent learning barriers were identified.   The majority of this rheumatology note was created using voice recognition software.  There is potential for wrong word or sound like substitutions due to the inherent limitations of voice recognition software.  Please read the chart carefully and recognize, using context, where substitutions have occurred.  Please excuse any identified errors in spelling or syntax.  Every effort has been made to appropriately correct upon completion of the note.

## 2020-01-28 NOTE — Patient Instructions (Signed)
[place], [place] [address]  [phone]

## 2020-01-29 ENCOUNTER — Ambulatory Visit
Admission: RE | Admit: 2020-01-29 | Discharge: 2020-01-29 | Disposition: A | Discharge status: Home / Self Care | Payer: No Typology Code available for payment source | Attending: Family Medicine | Admitting: Family Medicine

## 2020-01-29 DIAGNOSIS — Z87891 Personal history of nicotine dependence: Secondary | ICD-10-CM | POA: Diagnosis present

## 2020-01-29 DIAGNOSIS — Z780 Asymptomatic menopausal state: Secondary | ICD-10-CM | POA: Diagnosis present

## 2020-01-29 DIAGNOSIS — M810 Age-related osteoporosis without current pathological fracture: Secondary | ICD-10-CM | POA: Insufficient documentation

## 2020-01-30 ENCOUNTER — Telehealth (HOSPITAL_BASED_OUTPATIENT_CLINIC_OR_DEPARTMENT_OTHER): Payer: Self-pay | Admitting: Family Medicine

## 2020-01-30 DIAGNOSIS — R0982 Postnasal drip: Secondary | ICD-10-CM

## 2020-01-30 DIAGNOSIS — R0981 Nasal congestion: Secondary | ICD-10-CM

## 2020-01-30 DIAGNOSIS — K219 Gastro-esophageal reflux disease without esophagitis: Secondary | ICD-10-CM

## 2020-01-30 DIAGNOSIS — M810 Age-related osteoporosis without current pathological fracture: Secondary | ICD-10-CM

## 2020-01-30 NOTE — Progress Notes (Signed)
Pt informed by phone. See telephone encounter

## 2020-01-30 NOTE — Telephone Encounter (Signed)
Discussed DXA  T score is stable at -2.7. Pt has been off alendronate since 2021 due to GI side effects. Had been on since Jun 2016 so about 4 years.  Femoral neck T score is < 2.5 at 2.6. No fractures. Unclear if pt would benefit from IV bisphosphonates. Pt ok with E-consult with endocrinology.     Throat irritation since having bronchitis last year  Has a lot of phlegm in her throat  Feels like pills are getting stuck- needs to eat to help food go down  Food does not get stuck  Has a lot of mucus/PND at night  Some worsening hoarsness    Advised pt to call for PT appt too as per Dr. Lorane Gell      Audiology [phone]  Physiatry [place] [phone]  Plastic Surgery [phone]   Cardiology [phone]  Podiatry [phone]   Dermatology [phone] option 3  Pulmonology [phone]   Endocrinology [phone]  Rheumatology [phone]   ENT/Allergy [phone]  Sports Medicine [phone]   Gastroenterology [phone]  TB Clinic [phone]   Infectious Diseases [phone]  Thoracic Surgery [phone]   Nephrology [phone]  Travel Clinic [phone]   Neurology/EMG [phone]  Urology [phone]   Occupational Health [phone]  Vascular Surgery [phone]   Orthopedics [phone]  [place] ([place]) [place] (Ophthalmology/Optometry)  [place] (Gyn/OB/Midwives)   [name] [phone]  [place] [phone]   [name]  [place] [phone]      [place] [phone]          Radiology [phone]  Cards/Pulm Testing [phone]           PT/OT/Speech     Assembly  [place]

## 2020-02-01 ENCOUNTER — Other Ambulatory Visit (HOSPITAL_BASED_OUTPATIENT_CLINIC_OR_DEPARTMENT_OTHER): Payer: Self-pay | Admitting: Endocrinology

## 2020-02-01 DIAGNOSIS — M810 Age-related osteoporosis without current pathological fracture: Secondary | ICD-10-CM

## 2020-02-01 NOTE — e-consult (Signed)
Endocrinology e-Consultation    Thank you for referring your patient for an e-consultation. I have not interviewed or examined the patient. My recommendations here are based on the review of relevant clinical information and information provided by the referring provider.    Consultation request:   Would pt patient benefit from IV bisphosphonate for osteoporosis management?   74 year old pt with osteoporosis without history of fracture and currently not on alendronate due to GI side effects.   New Dxa T score is stable at -2.7 compared with June 2016. Pt has been off alendronate since 2021 due to GI side effects. She was on it for about 3-4 years since Jun 2016 (did not take it consistently).   Femoral neck T score is < 2.5 at 2.6 so wondering if she would benefit from longer course of bisphosphonate. She would need to change from PO to IV due to side effects.     Case Summary:   As above, 74 yo with osteoporosis who took alendronate inconsistently for 3-4 years from 2016- present   BMD: T-2.4 spine, T-2.7 left FN and left total hip, T-2.6 right FN, T-2.5 right total hip    A vitamin D level was 20 in 2016 and has not been repeated. There is no mention of calcium or vitamin D supplements in the note or on her med list.    Assessment:   Osteoporosis, not tolerating alendronate. Although prescribed alendronate from 2016-2021, given inconsistent adherence, she presumably has less than 5 years of exposure to bisphosphonates.    Recommendations:  1. Repeat vitamin D level and treat to >30 if low. Bisphosphonates are less effective in the setting of vitamin D deficiency  2. Make sure patient is consuming at least 1200 mg elemental calcium daily. If dietary intake is low, start a calcium supplement to reach 1200 mg  3. Once you have treated or ruled out vitamin D deficiency, let me know, and I can arrange IV Reclast therapy, assuming patient agrees she will do this.  This would be the best next step.        Please let me  know whether I can be of further assistance and please let me know how the patient responds to your management.    Sincerely yours,    Hanley Ben, MD    Patient Disposition : NO FOLLOW UP VISIT NEEDED    Time spent on this e-Consultation :  11-20 minutes, >50% of the total time devoted to medical consultative verbal/ EMR discussion between the providers written and verbal report will be generated

## 2020-02-02 ENCOUNTER — Other Ambulatory Visit: Payer: Self-pay

## 2020-02-06 ENCOUNTER — Other Ambulatory Visit: Payer: Self-pay

## 2020-02-06 ENCOUNTER — Telehealth (HOSPITAL_BASED_OUTPATIENT_CLINIC_OR_DEPARTMENT_OTHER): Payer: Self-pay | Admitting: Family Medicine

## 2020-02-06 DIAGNOSIS — E039 Hypothyroidism, unspecified: Secondary | ICD-10-CM

## 2020-02-06 DIAGNOSIS — E038 Other specified hypothyroidism: Secondary | ICD-10-CM

## 2020-02-06 DIAGNOSIS — M810 Age-related osteoporosis without current pathological fracture: Secondary | ICD-10-CM

## 2020-02-06 MED ORDER — CALCIUM CARBONATE 600 MG PO TABS
600.0000 mg | ORAL_TABLET | Freq: Every day | ORAL | 3 refills | Status: AC
Start: 2020-02-06 — End: 2021-02-05

## 2020-02-06 NOTE — Telephone Encounter (Signed)
Called pt to discuss osteoporosis plan    Pt is taking Centrum   Calcium- 300mg   Vitamin D- 1000IU  + additional vitamin D supplement 1000IU    Pt is also drinking about 1cup of milk a day so total of around 600mg  calcium    Will add another 600mg  supplement    Will check Vitamin and TSH level    Discussed Reclast briefly and will discuss again after Vitamin level > 30

## 2020-02-12 ENCOUNTER — Ambulatory Visit: Payer: No Typology Code available for payment source | Attending: Family Medicine

## 2020-02-12 ENCOUNTER — Other Ambulatory Visit: Payer: Self-pay

## 2020-02-12 DIAGNOSIS — E039 Hypothyroidism, unspecified: Secondary | ICD-10-CM | POA: Insufficient documentation

## 2020-02-12 DIAGNOSIS — E038 Other specified hypothyroidism: Secondary | ICD-10-CM

## 2020-02-12 DIAGNOSIS — M810 Age-related osteoporosis without current pathological fracture: Secondary | ICD-10-CM | POA: Diagnosis present

## 2020-02-12 LAB — FREE THYROXINE: FREE THYROXINE: 1.01 ng/dL (ref 0.76–1.46)

## 2020-02-12 LAB — VITAMIN D,25 HYDROXY: VITAMIN D,25 HYDROXY: 16 ng/mL — CL (ref 30.0–100.0)

## 2020-02-12 LAB — THYROID SCREEN TSH REFLEX FT4: THYROID SCREEN TSH REFLEX FT4: 4.11 u[IU]/mL — ABNORMAL HIGH (ref 0.358–3.740)

## 2020-02-12 NOTE — Progress Notes (Signed)
Labs drawn. Victoria Macdonald, [place], 02/12/2020

## 2020-02-13 ENCOUNTER — Telehealth (HOSPITAL_BASED_OUTPATIENT_CLINIC_OR_DEPARTMENT_OTHER): Payer: Self-pay | Admitting: Family Medicine

## 2020-02-13 DIAGNOSIS — K635 Polyp of colon: Secondary | ICD-10-CM

## 2020-02-13 DIAGNOSIS — K219 Gastro-esophageal reflux disease without esophagitis: Secondary | ICD-10-CM

## 2020-02-13 MED ORDER — ERGOCALCIFEROL 1.25 MG (50000 UT) PO CAPS
50000.0000 [IU] | ORAL_CAPSULE | ORAL | 0 refills | Status: DC
Start: 2020-02-13 — End: 2022-09-19

## 2020-02-13 MED ORDER — OMEPRAZOLE 20 MG PO CPDR
20.0000 mg | DELAYED_RELEASE_CAPSULE | Freq: Every day | ORAL | 0 refills | Status: DC
Start: 2020-02-13 — End: 2020-02-26

## 2020-02-13 NOTE — Telephone Encounter (Signed)
Called pt   Vitamin D is 16  Pt is not taking vitamin D daily because reflux is particularly bad  Pt is due for 3y colonoscopy so will order EGD+ colonoscopy  Add omeprazole for 2 weeks    Will also rx high dose vitamin D for 8 weeks  Pt still deciding on IV Reclast    Scheduled televisit for 2 weeks to review above

## 2020-02-13 NOTE — Progress Notes (Signed)
Pt informed by phone. See telephone encounter

## 2020-02-15 ENCOUNTER — Other Ambulatory Visit (HOSPITAL_BASED_OUTPATIENT_CLINIC_OR_DEPARTMENT_OTHER): Payer: Self-pay | Admitting: Family Medicine

## 2020-02-15 DIAGNOSIS — M2550 Pain in unspecified joint: Secondary | ICD-10-CM

## 2020-02-15 NOTE — Telephone Encounter (Signed)
PER Pharmacy, Rajanee Schuelke is a 74 year old female has requested a refill of voltaren.      Last Office Visit: 12/22/2019 with caitlin d agata   Last Physical Exam: 09/27/2017      Other Med Adult:  Most Recent BP Reading(s)  01/28/20 : 114/73        Cholesterol (mg/dL)   Date Value   03/04/2015 235     LOW DENSITY LIPOPROTEIN DIRECT (mg/dL)   Date Value   03/04/2015 171     HIGH DENSITY LIPOPROTEIN (mg/dL)   Date Value   03/04/2015 45     No results found for: TG      THYROID SCREEN TSH REFLEX FT4 (uIU/mL)   Date Value   02/12/2020 4.110 (H)         No results found for: TSH    No results found for: HGBA1C    No results found for: POCA1C      INR (no units)   Date Value   04/30/2018 1.0       SODIUM (mmol/L)   Date Value   05/28/2019 143       POTASSIUM (mmol/L)   Date Value   05/28/2019 3.9           CREATININE (mg/dL)   Date Value   05/28/2019 0.7       Documented patient preferred pharmacies:    [id number] [place] [place] [facility] [place]  Phone: [phone] Fax: [phone]

## 2020-02-16 ENCOUNTER — Encounter (HOSPITAL_BASED_OUTPATIENT_CLINIC_OR_DEPARTMENT_OTHER): Payer: Self-pay

## 2020-02-24 ENCOUNTER — Other Ambulatory Visit (HOSPITAL_BASED_OUTPATIENT_CLINIC_OR_DEPARTMENT_OTHER): Payer: Self-pay

## 2020-02-24 MED ORDER — SIMETHICONE 80 MG PO TABS
4.00 | ORAL_TABLET | ORAL | 0 refills | Status: AC
Start: 2020-02-24 — End: 2020-05-22

## 2020-02-24 MED ORDER — BISACODYL 5 MG PO TBEC
5.00 mg | DELAYED_RELEASE_TABLET | ORAL | 0 refills | Status: AC
Start: 2020-02-24 — End: 2020-05-22

## 2020-02-24 MED ORDER — POLYETHYLENE GLYCOL 3350 17 GM/SCOOP PO POWD
238.00 g | ORAL | 0 refills | Status: AC
Start: 2020-02-24 — End: 2020-05-22

## 2020-02-24 NOTE — Telephone Encounter (Signed)
[facility] Out[facility]:Chart reviewed+spoke with patient. Scheduled egd+colonoscopy for 04/21/20,arrival 0930.Procedure,prep-miralax+dulcolax,need for ride+covid testing explained. Instructions mailed.

## 2020-02-26 ENCOUNTER — Ambulatory Visit: Payer: No Typology Code available for payment source | Attending: Family Medicine | Admitting: Family Medicine

## 2020-02-26 DIAGNOSIS — K219 Gastro-esophageal reflux disease without esophagitis: Secondary | ICD-10-CM | POA: Diagnosis not present

## 2020-02-26 DIAGNOSIS — R0981 Nasal congestion: Secondary | ICD-10-CM

## 2020-02-26 DIAGNOSIS — F411 Generalized anxiety disorder: Secondary | ICD-10-CM | POA: Diagnosis not present

## 2020-02-26 DIAGNOSIS — Z23 Encounter for immunization: Secondary | ICD-10-CM

## 2020-02-26 MED ORDER — OMEPRAZOLE 20 MG PO CPDR
20.0000 mg | DELAYED_RELEASE_CAPSULE | Freq: Every day | ORAL | 1 refills | Status: DC
Start: 2020-02-26 — End: 2020-03-24

## 2020-02-26 NOTE — Progress Notes (Signed)
[facility] FAMILY MEDICINE  Telehealth Visit Note   Subjective:   CC: Victoria Macdonald is a 74 year old female patient who presents today for telehealth visit for No chief complaint on file.      Phone number: [phone]     #Mood concern  Improving   Thinks it is better because pain is better  Change her diet and started taking omeprazole   Does not feel we need to discuss today    #Osteoporosis  Calcium pills are very big  Taking vitamin D    # Abdominal pain  Pt was on famotidine but stopped it  Pt is just taking omeprazole now  Pt has EGD and colonoscopy scheduled on 6/30    SH/PMH:  Went to [facility] yesterday. Did [place]umb[employer]. Would like to schedule the pool  COVID vaccine- hesitant. Deep down does not want to do get it and knows she likely will. Concerned about how quickly it came out. Her brother is encouraging her to get it. Would definitely get it if she had to in order to travel to [place].    ROS:  Per HPI     polyethylene glycol (GLYCOLAX) 17 GM/SCOOP powder, Take 238 g by mouth See Admin Instructions Take as directed prior to colonoscopy, Disp: 238 g, Rfl: 0  bisacodyl (DULCOLAX) 5 MG EC tablet, Take 1 tablet by mouth See Admin Instructions Take as directed prior to colonoscopy, Disp: 4 tablet, Rfl: 0  Simethicone 80 MG TABS, Take 4 tablets by mouth See Admin Instructions Take as directed prior to colonoscopy., Disp: 4 tablet, Rfl: 0  diclofenac (VOLTAREN) 75 MG EC tablet, TAKE 1 TABLET BY MOUTH EVERY DAY AS NEEDED FOR PAIN, Disp: 60 tablet, Rfl: 1  cholecalciferol (HM VITAMIN D3) 1000 UNIT tablet, Take 1,000 Units by mouth daily, Disp: , Rfl:   omeprazole (PRILOSEC) 20 MG capsule, Take 1 capsule by mouth daily  for 14 days, Disp: 14 capsule, Rfl: 0  ergocalciferol (VITAMIN D2) 50000 UNIT capsule, Take 1 capsule by mouth once a week  for 8 doses, Disp: 8 capsule, Rfl: 0  calcium carbonate (CALCIUM 600) 600 MG TABS, Take 1 tablet by mouth daily, Disp: 90 tablet, Rfl: 3  trolamine salicylate (ASPERCREME) 10 % cream,  Apply topically 3 (three) times daily, Disp: 85 g, Rfl: 0  fluticasone (FLONASE) 50 MCG/ACT nasal spray, SPRAY 1 SPRAY INTO EACH NOSTRIL EVERY DAY, Disp: 16 mL, Rfl: 1  Diapers & Supplies MISC, Use 2 Devices As Directed nightly Pull up diapers. Size Medium  Dx-N39.44, Disp: 60 each, Rfl: 11  [DISCONTINUED] famotidine (PEPCID) 20 MG tablet, TAKE 1 TABLET BY MOUTH TWICE A DAY AS NEEDED, Disp: 180 tablet, Rfl: 1  fexofenadine (ALLEGRA) 180 MG tablet, TAKE 1 TABLET BY MOUTH EVERY DAY, Disp: 90 tablet, Rfl: 3  montelukast (SINGULAIR) 10 MG tablet, TAKE 1 TABLET BY MOUTH EVERY DAY AT NIGHT, Disp: 90 tablet, Rfl: 3  carboxymethylcellulose (REFRESH TEARS) 0.5 % SOLN, Place 1 drop into both eyes 4 (four) times daily, Disp: 1 Bottle, Rfl: 12  artificial tears (LUBRIFRESH P.M.) OINT, Apply to eye, Disp: , Rfl:     No current facility-administered medications on file prior to visit.    Objective:     Exam - vitals deferred in setting of COVID pandemic  Speaking in complete sentences, breathing comfortably  Able to concentrate throughout visit    Assessment & Plan:     1. Gastroesophageal reflux disease without esophagitis  Doing better on PPI than  H2 blocker. Medication list updated  Pt to keep upcoming EGD/colonoscopy in June  - omeprazole (PRILOSEC) 20 MG capsule; Take 1 capsule by mouth daily  Dispense: 30 capsule; Refill: 1    2. GAD (generalized anxiety disorder)  Mood better with physical sx being better  Declines further conversation today but will call if worsening or new concerns    3. Nasal congestion  Pt had ENT appt same day as EGD/colonoscopy  Pt given number for Dr. Seward Speck office to reschedule 6/30 appt    4. Need for COVID-19 vaccine  Discussed concerns and what is known about COVID vaccine  Validated that there is information we do not know and it has been now given to millions of people so we do know how effective it is at preventing severe COVID infection and death right now.   Pt will think about it and asked  for number to schedule vaccine appt. Information below given to patient      We discussed the patients current medications. The patient expressed understanding and no barriers to adherence were identified.  Fredirick Lathe, MD 02/26/2020    Thank you for inquiring about the COVID vaccine. All people 45 and older are now eligible for the COVID vaccine.    You can schedule your COVID vaccine at whichever vaccine site is convenient for you.     To schedule at our [employer] at [address] (near the [employer]):  o Schedule right here in [place] on the main page! OR  o Call our [place] at [phone]   To schedule at our [place] site at the [employer] ([place]):  o Visit [email or web address] OR  o Call our [place] at [phone]   To schedule at our [place] site at [place] ([place]):  o Visit [email or web address]   To schedule at any other site, visit vaxfinder.mass.gov or call 2-1-1    We know people sometimes worry about vaccines, but we believe that the COVID vaccine is safe and effective, and is an important tool to help end the pandemic. We are here for you if you have any questions or concerns when the vaccine becomes available to patients. You can also visit [email or web address] for more information and to view our FAQ.    For now, please continue to stay home as much as possible and avoid spending time with people who are not in your household. In public, stay six feet away from others, wear a mask, and don't forget to wash your hands. And, of course, let us know how we can help. Stay safe.  ______

## 2020-03-01 ENCOUNTER — Other Ambulatory Visit: Payer: Self-pay

## 2020-03-13 ENCOUNTER — Other Ambulatory Visit (HOSPITAL_BASED_OUTPATIENT_CLINIC_OR_DEPARTMENT_OTHER): Payer: Self-pay | Admitting: Internal Medicine

## 2020-03-13 NOTE — Telephone Encounter (Signed)
PER Pharmacy, Victoria Macdonald is a 74 year old female has requested a refill of DICLOFENAC GEL.      Last Office Visit: 01/28/20 with Frederico Hamman  Last Physical Exam: 09/27/2017    COLONOSCOPY due on 02/27/2020    Other Med Adult:  Most Recent BP Reading(s)  01/28/20 : 114/73        Cholesterol (mg/dL)   Date Value   03/04/2015 235     LOW DENSITY LIPOPROTEIN DIRECT (mg/dL)   Date Value   03/04/2015 171     HIGH DENSITY LIPOPROTEIN (mg/dL)   Date Value   03/04/2015 45     No results found for: TG      THYROID SCREEN TSH REFLEX FT4 (uIU/mL)   Date Value   02/12/2020 4.110 (H)         No results found for: TSH    No results found for: HGBA1C    No results found for: POCA1C      INR (no units)   Date Value   04/30/2018 1.0       SODIUM (mmol/L)   Date Value   05/28/2019 143       POTASSIUM (mmol/L)   Date Value   05/28/2019 3.9           CREATININE (mg/dL)   Date Value   05/28/2019 0.7       Documented patient preferred pharmacies:    [id number], [place] - [place]  Phone: [phone] Fax: [phone]    [facility] [place], [place] - [address]  Phone: [phone] Fax: [phone]

## 2020-03-24 ENCOUNTER — Other Ambulatory Visit (HOSPITAL_BASED_OUTPATIENT_CLINIC_OR_DEPARTMENT_OTHER): Payer: Self-pay | Admitting: Family Medicine

## 2020-03-24 DIAGNOSIS — K219 Gastro-esophageal reflux disease without esophagitis: Secondary | ICD-10-CM

## 2020-03-24 NOTE — Telephone Encounter (Signed)
PER Pharmacy, Victoria Macdonald is a 74 year old female has requested a refill of omeprazole      Last Office Visit: 02/26/2020 with PCP  Last Physical Exam: 09/27/2017    COLONOSCOPY due on 02/27/2020    Other Med Adult:  Most Recent BP Reading(s)  01/28/20 : 114/73        Cholesterol (mg/dL)   Date Value   03/04/2015 235     LOW DENSITY LIPOPROTEIN DIRECT (mg/dL)   Date Value   03/04/2015 171     HIGH DENSITY LIPOPROTEIN (mg/dL)   Date Value   03/04/2015 45     No results found for: TG      THYROID SCREEN TSH REFLEX FT4 (uIU/mL)   Date Value   02/12/2020 4.110 (H)         No results found for: TSH    No results found for: HGBA1C    No results found for: POCA1C      INR (no units)   Date Value   04/30/2018 1.0       SODIUM (mmol/L)   Date Value   05/28/2019 143       POTASSIUM (mmol/L)   Date Value   05/28/2019 3.9           CREATININE (mg/dL)   Date Value   05/28/2019 0.7       Documented patient preferred pharmacies:    [id number] [place], [place] - [place]  Phone: [phone] Fax: [phone]    [facility] [place], [place] - [address]  Phone: [phone] Fax: [phone]

## 2020-03-28 ENCOUNTER — Other Ambulatory Visit (HOSPITAL_BASED_OUTPATIENT_CLINIC_OR_DEPARTMENT_OTHER): Payer: Self-pay | Admitting: Family Medicine

## 2020-03-29 NOTE — Telephone Encounter (Signed)
PER Pharmacy, Victoria Macdonald is a 73 year old female has requested a refill of ERGOCALCIFEROL      Last Office Visit: 02/26/2020 with PCP  Last Physical Exam: 09/27/2017    COLONOSCOPY due on 02/27/2020    Other Med Adult:  Most Recent BP Reading(s)  01/28/20 : 114/73            Cholesterol (mg/dL)   Date Value   03/04/2015 235         LOW DENSITY LIPOPROTEIN DIRECT (mg/dL)   Date Value   03/04/2015 171         HIGH DENSITY LIPOPROTEIN (mg/dL)   Date Value   03/04/2015 45     No results found for: TG          THYROID SCREEN TSH REFLEX FT4 (uIU/mL)   Date Value   02/12/2020 4.110 (H)         No results found for: TSH    No results found for: HGBA1C    No results found for: POCA1C          INR (no units)   Date Value   04/30/2018 1.0           SODIUM (mmol/L)   Date Value   05/28/2019 143           POTASSIUM (mmol/L)   Date Value   05/28/2019 3.9               CREATININE (mg/dL)   Date Value   05/28/2019 0.7       Documented patient preferred pharmacies:    [id number] [address], [place] - [place]  Phone: [phone] Fax: [phone]

## 2020-04-09 ENCOUNTER — Other Ambulatory Visit (HOSPITAL_BASED_OUTPATIENT_CLINIC_OR_DEPARTMENT_OTHER): Payer: Self-pay | Admitting: Physician Assistant

## 2020-04-09 DIAGNOSIS — Z01812 Encounter for preprocedural laboratory examination: Secondary | ICD-10-CM

## 2020-04-13 ENCOUNTER — Encounter (HOSPITAL_BASED_OUTPATIENT_CLINIC_OR_DEPARTMENT_OTHER): Payer: Self-pay | Admitting: Family Medicine

## 2020-04-16 ENCOUNTER — Telehealth (HOSPITAL_BASED_OUTPATIENT_CLINIC_OR_DEPARTMENT_OTHER): Payer: Self-pay | Admitting: Surgery

## 2020-04-16 NOTE — Telephone Encounter (Signed)
Called and spoke with patient to confirm colonoscopy with Dr. Mamie Levers on Wednesday 04/21/20 with an arrival time at 9:30 am at the [facility].    Reviewed Colonoscopy prep instructions, patient verbalized understanding.    Patient understands the need for ride after the procedure.     Patient was given the office number to call for any questions.

## 2020-04-18 LAB — COVID-19 ANTIGEN CARE EVERYWHERE
COVID-19 ANTIGEN CARE EVERWHERE: NEGATIVE — NL
LOT NUMBER CARE EVERYWHERE: 6000336 — NL

## 2020-04-19 ENCOUNTER — Ambulatory Visit (HOSPITAL_BASED_OUTPATIENT_CLINIC_OR_DEPARTMENT_OTHER): Payer: No Typology Code available for payment source

## 2020-04-19 ENCOUNTER — Ambulatory Visit
Admission: RE | Admit: 2020-04-19 | Discharge: 2020-04-19 | Disposition: A | Discharge status: Home / Self Care | Payer: No Typology Code available for payment source | Attending: Family Medicine | Admitting: Family Medicine

## 2020-04-21 ENCOUNTER — Other Ambulatory Visit (HOSPITAL_BASED_OUTPATIENT_CLINIC_OR_DEPARTMENT_OTHER): Payer: Self-pay

## 2020-04-21 ENCOUNTER — Encounter (HOSPITAL_BASED_OUTPATIENT_CLINIC_OR_DEPARTMENT_OTHER): Payer: Self-pay | Admitting: Otolaryngology

## 2020-04-29 ENCOUNTER — Encounter (HOSPITAL_BASED_OUTPATIENT_CLINIC_OR_DEPARTMENT_OTHER): Payer: Self-pay | Admitting: Internal Medicine

## 2020-04-29 ENCOUNTER — Ambulatory Visit: Payer: No Typology Code available for payment source | Attending: Internal Medicine | Admitting: Internal Medicine

## 2020-04-29 VITALS — BP 142/69 | HR 84 | Resp 18 | Ht 60.0 in | Wt 150.0 lb

## 2020-04-29 DIAGNOSIS — M7542 Impingement syndrome of left shoulder: Secondary | ICD-10-CM

## 2020-04-29 DIAGNOSIS — G8929 Other chronic pain: Secondary | ICD-10-CM | POA: Diagnosis present

## 2020-04-29 DIAGNOSIS — M25561 Pain in right knee: Secondary | ICD-10-CM

## 2020-04-29 DIAGNOSIS — M7062 Trochanteric bursitis, left hip: Secondary | ICD-10-CM

## 2020-04-29 DIAGNOSIS — M25562 Pain in left knee: Secondary | ICD-10-CM | POA: Diagnosis present

## 2020-04-29 DIAGNOSIS — R768 Other specified abnormal immunological findings in serum: Secondary | ICD-10-CM

## 2020-04-29 NOTE — Progress Notes (Signed)
[facility]  Rheumatology Follow-up Patient Note    Date of visit: 04/29/2020    Patient: Victoria Macdonald  Date of birth: Feb 18, 1946  Age: 74 year old  Gender as per [place]  PCP: Fredirick Lathe, MD  Primary language: [place]  Last visit: 10/28/2019    Reason for Visit: Osteoarthritis, rheumatoid factor in the setting of hepatitis C    HPI:  This is a follow-up visit for this 74 year old woman with joint pain and osteoarthritis.    Medical issues:  1.  Rheumatoid factor +  2.  Hepatitis C virus-viral load undetectable 04/2018  3.  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  Knee  5.  Shoulder impingement-glucocorticoid injection 01/2020  6.  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  Tobacco use    Interval history:  Her shoulder pain has resolved after injection.  Mild left knee pain.  She is not interested in injection.  She denies hand pain and swelling.  Diclofenac gel and acetaminophen helped relieve pain.  Occasional lower extremity edema.  She has decided against having COVID-19 vaccination.    Past medical history:  Tobacco use: Now smoking half a pack of cigarettes a day. Has smoked since the age of 53.  Stable pulmonary nodules  Osteoporosis  Anxiety  Rheumatoid factor +    GI:  Colonic polyp  GERD  Hepatitis C virus antibody (undetectable viral load)  Hepatitis B core antibody, E antibody and surface antibody-previous infection  (undetectable viral load)  History of H. pylori infection  Possible fatty liver by imaging  Splenic cyst    Past surgical history:  1985: ABDOMINAL SURGERY  09/02/27: BELPHAROPTOSIS REPAIR; Bilateral  Comment: S/p bilateral ELA, lateral direct brow lift and excision  of xanthelasmas with full thickness skin grafts nasal   bridge bilaterally 09/11/2017  03/2017: CATARACT EXTRACTION, BILATERAL; Bilateral  Comment:  s/p cataract extraction with PC IOL OU, at [facility], Dr.   03/2017: PB - PB - AFTER CATARACT LASER SURGERY; Bilateral  Comment: s/p yag laser capsulotomy OU at [facility]  ~1975: TUBAL LIGATION    Hand dominance: Right-handed      Allergies:  Review of Patient's Allergies indicates:   Lactose intolerance*    Other (See Comments)    Comment:bloating    Medications:  Current Outpatient Medications   Medication Sig    omeprazole (PRILOSEC) 20 MG capsule TAKE 1 CAPSULE BY MOUTH EVERY DAY    diclofenac (VOLTAREN) 1 % GEL Gel APPLY 2 G TOPICALLY 4 (FOUR) TIMES DAILY AS NEEDED    polyethylene glycol (GLYCOLAX) 17 GM/SCOOP powder Take 238 g by mouth See Admin Instructions Take as directed prior to colonoscopy    bisacodyl (DULCOLAX) 5 MG EC tablet Take 1 tablet by mouth See Admin Instructions Take as directed prior to colonoscopy    Simethicone 80 MG TABS Take 4 tablets by mouth See Admin Instructions Take as directed prior to colonoscopy.    diclofenac (VOLTAREN) 75 MG EC tablet TAKE 1 TABLET BY MOUTH EVERY DAY AS NEEDED FOR PAIN    cholecalciferol (HM VITAMIN D3) 1000 UNIT tablet Take 1,000 Units by mouth daily    ergocalciferol (VITAMIN D2) 50000 UNIT capsule Take 1 capsule by mouth once a week  for 8 doses    calcium carbonate (CALCIUM 600) 600 MG TABS Take 1 tablet by mouth daily    fluticasone (FLONASE) 50 MCG/ACT nasal spray SPRAY 1 SPRAY INTO [facility]  NOSTRIL EVERY DAY    Diapers & Supplies MISC Use 2 Devices As Directed nightly Pull up diapers. Size Medium  Dx-N39.44    fexofenadine (ALLEGRA) 180 MG tablet TAKE 1 TABLET BY MOUTH EVERY DAY    montelukast (SINGULAIR) 10 MG tablet TAKE 1 TABLET BY MOUTH EVERY DAY AT NIGHT    carboxymethylcellulose (REFRESH TEARS) 0.5 % SOLN Place 1 drop into both eyes 4 (four) times daily    artificial tears (LUBRIFRESH P.M.) OINT Apply to eye     No current facility-administered medications for this visit.     Social history:  She smokes half a pack cigarettes  daily.  She has smoked since the age of 76.  Denies alcohol use.  Performs her ADLs.  From [place].    Gynecologic history:  G3 P2 1 miscarriage  Menarche at age11.    Family History:  No history of rheumatoid arthritis    Review of systems:  Constitutional, Eyes, ENT/Mouth, Cardiovascular, Respiratory, GI, GU, Neuro, Psych, Heme/Lymph, Skin, Musculoskeletal, and Endocrine systems were reviewed and are NEGATIVE, except for what is documented in the note.    PE:   04/29/20  1300   BP: 142/69   Site: Right Arm   Position: Sitting   Cuff Size: Regular   Pulse: 84   Resp: 18   SpO2: 99%   Weight: 68 kg (150 lb)   Height: 5' (1.524 m)     Body mass index is 29.29 kg/m.  GENERAL: NAD.  Alert and oriented. Good eye contact. Full affect. Follows commands appropriately.  Well-groomed.  HEENT: Anicteric.  No scleral injection.    EXTREMITIES: Well perfused. No edema. No clubbing.  SKIN: No rashes, nodules, bruising  MUSCULOSKELETAL:   No synovitis.  NEURO:   Steady gait.  No weakness.   No difficulty getting up from seated position without using arms for assistance or out of a supine position.    No assistive ambulatory devices.    LABS that I personally reviewed today:  Creatinine 0.7, GFR greater than 60  AST 12, ALT 60  CRP less than 0.3  Vitamin D 16 in April  WBC 6, hematocrit 41, platelets 158  CCP negative    Assessment:  Victoria Macdonald is a 74 year old [place] speaking female with rheumatoid factor +.  No evidence of rheumatoid arthritis.  Impingement of the left shoulder has improved after injection.  She has minor left hip pain with a history of trochanteric bursitis.  She has minor left knee pain in setting of osteoarthritis.    Plan/recommendations:  If she develops significant swelling in the joints, I would like to reevaluate her.    We discussed continue with acetaminophen and topical agents.    I recommend Malva Diesing follow up in approximately 12 weeks, earlier if needed.     The  patient was given an updated medication list as part of the end of visit summary.     The patient's questions have been addressed and answered.  The patient verbalized an understanding and agreement with this plan.    The majority of this rheumatology note was created using voice recognition software.  There is potential for wrong word or sound like substitutions due to the inherent limitations of voice recognition software.  Please read the chart carefully and recognize, using context, where substitutions have occurred.  Please excuse any identified errors in spelling or syntax.  Every effort has been made to appropriately correct upon  completion of the note.

## 2020-05-13 ENCOUNTER — Ambulatory Visit (HOSPITAL_BASED_OUTPATIENT_CLINIC_OR_DEPARTMENT_OTHER): Payer: Self-pay | Admitting: Otolaryngology

## 2020-05-13 ENCOUNTER — Other Ambulatory Visit (HOSPITAL_BASED_OUTPATIENT_CLINIC_OR_DEPARTMENT_OTHER): Payer: Self-pay | Admitting: Medical

## 2020-05-13 DIAGNOSIS — M2550 Pain in unspecified joint: Secondary | ICD-10-CM

## 2020-06-01 ENCOUNTER — Encounter (HOSPITAL_BASED_OUTPATIENT_CLINIC_OR_DEPARTMENT_OTHER): Payer: Self-pay | Admitting: Family Medicine

## 2020-06-17 ENCOUNTER — Other Ambulatory Visit: Payer: Self-pay

## 2020-06-17 ENCOUNTER — Other Ambulatory Visit (HOSPITAL_BASED_OUTPATIENT_CLINIC_OR_DEPARTMENT_OTHER): Payer: Self-pay

## 2020-06-17 MED ORDER — SIMETHICONE 80 MG PO TABS
4.0000 | ORAL_TABLET | Freq: Once | ORAL | 0 refills | Status: AC
Start: 2020-06-17 — End: 2020-06-17

## 2020-06-17 MED ORDER — BISACODYL 5 MG PO TBEC
20.00 mg | DELAYED_RELEASE_TABLET | Freq: Once | ORAL | 0 refills | Status: AC
Start: 2020-06-17 — End: 2020-06-17

## 2020-06-17 MED ORDER — POLYETHYLENE GLYCOL 3350 17 GM/SCOOP PO POWD
238.0000 g | Freq: Once | ORAL | 0 refills | Status: AC
Start: 2020-06-17 — End: 2020-06-17

## 2020-06-21 ENCOUNTER — Other Ambulatory Visit (HOSPITAL_BASED_OUTPATIENT_CLINIC_OR_DEPARTMENT_OTHER): Payer: Self-pay | Admitting: Family Medicine

## 2020-06-21 DIAGNOSIS — K219 Gastro-esophageal reflux disease without esophagitis: Secondary | ICD-10-CM

## 2020-06-21 NOTE — Telephone Encounter (Signed)
Victoria Macdonald is a 74 year old female has requested a refill of     -  OMEPRAZOLE       Last Office Visit: 02-26-20 with PCP  Last Physical Exam: 09-27-2017    COLONOSCOPY due on 02/27/2020    Other Med Adult:  Most Recent BP Reading(s)  04/29/20 : 142/69        Cholesterol (mg/dL)   Date Value   03/04/2015 235     LOW DENSITY LIPOPROTEIN DIRECT (mg/dL)   Date Value   03/04/2015 171     HIGH DENSITY LIPOPROTEIN (mg/dL)   Date Value   03/04/2015 45     No results found for: TG      THYROID SCREEN TSH REFLEX FT4 (uIU/mL)   Date Value   02/12/2020 4.110 (H)         No results found for: TSH    No results found for: HGBA1C    No results found for: POCA1C      INR (no units)   Date Value   04/30/2018 1.0       SODIUM (mmol/L)   Date Value   05/28/2019 143       POTASSIUM (mmol/L)   Date Value   05/28/2019 3.9           CREATININE (mg/dL)   Date Value   05/28/2019 0.7     Other Med Pedi:  Most Recent BP Reading(s)  04/29/20 : 142/69      Most Recent Weight Reading(s)  04/29/20 : 68 kg (150 lb)      Most Recent Height Reading(s)  04/29/20 : 5' (1.524 m)        Documented patient preferred pharmacies:    [id number], [place] - [place]  Phone: [phone] Fax: [phone]

## 2020-06-22 ENCOUNTER — Other Ambulatory Visit (HOSPITAL_BASED_OUTPATIENT_CLINIC_OR_DEPARTMENT_OTHER): Payer: Self-pay | Admitting: Family Medicine

## 2020-06-22 NOTE — Telephone Encounter (Signed)
PER Pharmacy, Victoria Macdonald is a 74 year old female has requested a refill of     Famotidine        Last Office Visit: 02/26/20   with pcp   Last Physical Exam: 09/27/2017     COLONOSCOPY due on 02/27/2020     Other Med Adult:  Most Recent BP Reading(s)  04/29/20 : 142/69        Cholesterol (mg/dL)   Date Value   03/04/2015 235     LOW DENSITY LIPOPROTEIN DIRECT (mg/dL)   Date Value   03/04/2015 171     HIGH DENSITY LIPOPROTEIN (mg/dL)   Date Value   03/04/2015 45     No results found for: TG      THYROID SCREEN TSH REFLEX FT4 (uIU/mL)   Date Value   02/12/2020 4.110 (H)         No results found for: TSH    No results found for: HGBA1C    No results found for: POCA1C      INR (no units)   Date Value   04/30/2018 1.0       SODIUM (mmol/L)   Date Value   05/28/2019 143       POTASSIUM (mmol/L)   Date Value   05/28/2019 3.9           CREATININE (mg/dL)   Date Value   05/28/2019 0.7        Documented patient preferred pharmacies:    [id number] [address], [place] - [place]  Phone: [phone] Fax: [phone]

## 2020-06-29 ENCOUNTER — Ambulatory Visit
Admission: RE | Admit: 2020-06-29 | Discharge: 2020-06-29 | Disposition: A | Discharge status: Home / Self Care | Payer: No Typology Code available for payment source | Attending: Family Medicine | Admitting: Family Medicine

## 2020-06-29 ENCOUNTER — Ambulatory Visit (HOSPITAL_BASED_OUTPATIENT_CLINIC_OR_DEPARTMENT_OTHER): Payer: Self-pay | Admitting: Anesthesiology

## 2020-06-29 ENCOUNTER — Encounter (HOSPITAL_BASED_OUTPATIENT_CLINIC_OR_DEPARTMENT_OTHER): Payer: Self-pay

## 2020-06-29 ENCOUNTER — Other Ambulatory Visit: Payer: Self-pay

## 2020-06-29 ENCOUNTER — Encounter (HOSPITAL_BASED_OUTPATIENT_CLINIC_OR_DEPARTMENT_OTHER): Payer: Self-pay | Admitting: Anesthesiology

## 2020-06-29 DIAGNOSIS — K5730 Diverticulosis of large intestine without perforation or abscess without bleeding: Secondary | ICD-10-CM | POA: Insufficient documentation

## 2020-06-29 DIAGNOSIS — K228 Other specified diseases of esophagus: Secondary | ICD-10-CM | POA: Diagnosis not present

## 2020-06-29 DIAGNOSIS — F17200 Nicotine dependence, unspecified, uncomplicated: Secondary | ICD-10-CM | POA: Diagnosis not present

## 2020-06-29 DIAGNOSIS — Z86010 Personal history of colonic polyps: Secondary | ICD-10-CM | POA: Diagnosis not present

## 2020-06-29 DIAGNOSIS — K2950 Unspecified chronic gastritis without bleeding: Secondary | ICD-10-CM

## 2020-06-29 DIAGNOSIS — Z1211 Encounter for screening for malignant neoplasm of colon: Secondary | ICD-10-CM | POA: Diagnosis not present

## 2020-06-29 DIAGNOSIS — R12 Heartburn: Secondary | ICD-10-CM

## 2020-06-29 DIAGNOSIS — K2270 Barrett's esophagus without dysplasia: Secondary | ICD-10-CM | POA: Diagnosis not present

## 2020-06-29 DIAGNOSIS — F411 Generalized anxiety disorder: Secondary | ICD-10-CM | POA: Insufficient documentation

## 2020-06-29 DIAGNOSIS — K209 Esophagitis, unspecified without bleeding: Secondary | ICD-10-CM | POA: Insufficient documentation

## 2020-06-29 DIAGNOSIS — K648 Other hemorrhoids: Secondary | ICD-10-CM | POA: Diagnosis not present

## 2020-06-29 DIAGNOSIS — Z8619 Personal history of other infectious and parasitic diseases: Secondary | ICD-10-CM | POA: Diagnosis not present

## 2020-06-29 DIAGNOSIS — B9681 Helicobacter pylori [H. pylori] as the cause of diseases classified elsewhere: Secondary | ICD-10-CM | POA: Insufficient documentation

## 2020-06-29 MED ORDER — LACTATED RINGERS IV SOLN
INTRAVENOUS | Status: DC
Start: 2020-06-29 — End: 2020-06-30
  Administered 2020-06-29: 400 mL via INTRAVENOUS

## 2020-06-29 MED ORDER — PROPOFOL 500 MG/50 ML IV
Freq: Once | INTRAVENOUS | Status: DC | PRN
Start: 2020-06-29 — End: 2020-06-29
  Administered 2020-06-29: 20 mg via INTRAVENOUS
  Administered 2020-06-29: 30 mg via INTRAVENOUS
  Administered 2020-06-29 (×5): 50 mg via INTRAVENOUS

## 2020-06-29 MED ORDER — EPHEDRINE SULFATE 50 MG/ML IJ SOLN (SUPER ERX)
INTRAMUSCULAR | Status: AC
Start: 2020-06-29 — End: 2020-06-29
  Filled 2020-06-29: qty 1

## 2020-06-29 MED ORDER — EPHEDRINE SULFATE 50 MG/ML IJ SOLN (SUPER ERX)
Freq: Once | INTRAMUSCULAR | Status: DC | PRN
Start: 2020-06-29 — End: 2020-06-29
  Administered 2020-06-29 (×3): 10 mg via INTRAVENOUS

## 2020-06-29 NOTE — H&P (Signed)
GI Pre-procedure History and Physical Short Form  Victoria Macdonald is an 74 year old female.    Chief Complaint: She is here for Both upper GI endoscopy and colonoscopy    HPI: This is a 74 year old female with history of chronic gastritis, H. Pylori, Berret's and colonic polyps. Her last EGD and colonoscopy was 3 years ago. Since then her GERD symptoms have persist, even with use of ranitidine./ She refused taking antibiotics for H. Pylori. No family history of gastric cancer.     Active Problems:  Patient Active Problem List:     Right low back pain     Tobacco use disorder     Sessile colonic polyp     Adjustment disorder with depressed mood     Osteoporosis     GAD (generalized anxiety disorder)     Gastroesophageal reflux disease without esophagitis     Lung nodules     Adhesive capsulitis of left shoulder     Nasal congestion     HCV antibody positive     Hepatitis B surface antigen positive     H. pylori infection     Postmenopausal bleeding     Pseudophakia of both eyes     Astigmatism of both eyes with presbyopia     Dry eye     Squamous blepharitis of upper and lower eyelids of both eyes     Arthralgia     Urinary incontinence, nocturnal enuresis      Past Medical History:   Past Medical History:  08/13/2018: Astigmatism of both eyes with presbyopia  No date: Back pain  08/13/2018: Dry eye  05/13/2015: LLQ pain  08/13/2018: Squamous blepharitis of upper and lower eyelids of both   eyes    Past Surgical History:  Past Surgical History:  ~1985: ABDOMINAL SURGERY  09/02/27: BELPHAROPTOSIS REPAIR; Bilateral      Comment:  S/p bilateral ELA, lateral direct brow lift and excision               of xanthelasmas with full thickness skin grafts nasal                bridge bilaterally 09/11/2017  03/2017: CATARACT EXTRACTION, BILATERAL; Bilateral      Comment:  s/p cataract extraction with PC IOL OU, at [facility], Dr.   03/2017: PB - PB - AFTER CATARACT LASER SURGERY; Bilateral      Comment:  s/p yag laser capsulotomy  OU at [facility]  ~1975: TUBAL LIGATION    Social History:  Social History     Socioeconomic History    Marital status: Single     Spouse name: Not on file    Number of children: Not on file    Years of education: Not on file    Highest education level: Not on file   Occupational History    Not on file   Tobacco Use    Smoking status: Current Every Day Smoker     Packs/day: 0.25     Years: 53.00     Pack years: 13.25    Smokeless tobacco: Never Used    Tobacco comment: currently smoking 5/day   Substance and Sexual Activity    Alcohol use: No     Alcohol/week: 0.0 standard drinks    Drug use: No    Sexual activity: Not Currently   Other Topics Concern    Not on file   Social History Narrative    2018  Lives alone    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 Was eating shake in the past     Exercise- walking a little     Wears her seatbelt    Denies safety concerns    Faith is important to her   Social Determinants of Health  Financial Resource Strain:     Difficulty of Paying Living Expenses:   Food Insecurity:     Worried About Charity fundraiser in the Last Year:     Arboriculturist in the Last Year:   Transportation Needs:     Film/video editor (Medical):     Lack of Transportation (Non-Medical):   Physical Activity:     Days of Exercise per Week:     Minutes of Exercise per Session:   Stress:     Feeling of Stress :   Social Connections:     Frequency of Communication with Friends and Family:     Frequency of Social Gatherings with Friends and Family:     Attends Religious Services:     Active Member of Clubs or Organizations:     Attends Music therapist:     Marital Status:   Intimate Partner Violence:     Fear of Current or Ex-Partner:     Emotionally Abused:     Physically Abused:     Sexually Abused:     Family  History:  Review of patient's family history indicates:  Problem: Cancer - Other      Relation: Sister          Age of Onset: (Not Specified)          Comment: Uterus, ovarian, intestinal, liver  Problem: Glaucoma      Relation: Brother          Age of Onset: (Not Specified)  Problem: OTHER      Relation: Sister          Age of Onset: (Not Specified)          Comment: Dementia      Allergies:   Review of Patient's Allergies indicates:   Lactose intolerance*    Other (See Comments)    Comment:bloating    Medications:   (Not in a hospital admission)      Review of Systems:  Review of Systems:  HEENT: No loose teeth, nosebleeds, glaucoma, cataracts    Cardiovascular:  No chest pain, palpitations, HTN, hypercholesterolemia, MI, heart surgery or heart failure.  Pulmonary:  No pneumonia, COPD, chronic cough,TB or asthma.    Neuro:  No stroke, seizure, migraines or loss of consciousness.    Endocrine:  No diabetes or thyroid disease.    ID:  No TB, hepatitis A, hepatitis B, hepatitis C, HIV or rheumatic fever.    GU:  No hematuria, nephrolithiasis, kidney disease or bladder disease.  Heme/Onc:  No cancer, anemia or bleeding diathesis.   Derm:  No rash, hives, or shingles.    Psych: No anxiety disorder, depression, bullemia, anorexia nervosa, alcoholism or substance abuse  Musculoskeletal: No gout, arthritis, muscle disease or lupus.      Physical Exam:  Vital Signs: BP 125/72    Pulse 86    Temp 97.8 F (36.6 C) (Temporal)    Resp 18    Ht 5' (1.524 m)    Wt 65.8  kg (145 lb)    LMP  (LMP Unknown)    SpO2 98%    BMI 28.32 kg/m   General: average body habitus.   HEENT: Normocephalic, atrumatic, PERRLA, Extra occular motions intact. No scleral icterus. Pharynx benign. Tongue midline.  Airway Evaluation:  Gag reflex intact: Yes  Ability to open mouth wide:   Full  Dentures:  None  Loose teeth:  No  Neck range of motion  Full  Mallampati Airway Classification: Class II   The same as Class I except the tonsilar pillars are  hidden by the tongue.  Pulmonary: Clear to auscultation and percussion. No rales, wheezes or ronchi.  Cardiovascular: S1, S2, no S3, S4, clicks, rubs or murmurs.  JVD not elevated with patient sitting.  Abdominal: Normal bowel sounds. No tenderness on light or deep palpation. No hepatomegally or spleenomegally.  Extremities: Without clubbing, cyanosis or edema.   Neurological: No asterixis. Sensation intact. Cranial nerves II - XII grossly intact. Normal gait.   Derm: No jaundice, hives or rashes.    ASA Classification: ASA Class II (a patient with mild systemic disease)    Impression:  Proceed with upper and lower endoscopy.    Medical Decision Making:  Proceed was explained. Consent was obtained. Questions where answered.    Minna Merritts. [facility] PGY-2

## 2020-06-29 NOTE — Anesthesia Preprocedure Evaluation (Signed)
Pre-Anesthetic Note  .  [place] AN TELEVISIT:   Is this a televisit?: No          Patient: Victoria Macdonald is a 74 year old female      Procedure Information     Date/Time: 06/29/20 1000    Scheduled providers: Retta Diones., MD    Procedure: GI COLONOSCOPY WITH ESOPHAGOGASTRODUODENOSCOPY (EGD)    Location: [facility] - Gastroenterology          Relevant Problems   GI   (+) Gastroesophageal reflux disease without esophagitis      Other   (+) Adhesive capsulitis of left shoulder       _0 @      Previous Anesthetic History:   Past Surgical History:  ~1985: ABDOMINAL SURGERY  09/02/27: BELPHAROPTOSIS REPAIR; Bilateral      Comment:  S/p bilateral ELA, lateral direct brow lift and excision               of xanthelasmas with full thickness skin grafts nasal                bridge bilaterally 09/11/2017  03/2017: CATARACT EXTRACTION, BILATERAL; Bilateral      Comment:  s/p cataract extraction with PC IOL OU, at [facility], Dr.   03/2017: PB - PB - AFTER CATARACT LASER SURGERY; Bilateral      Comment:  s/p yag laser capsulotomy OU at [facility]  ~1975: TUBAL LIGATION    Current Medications:    omeprazole (PRILOSEC) 20 MG capsule, TAKE 1 CAPSULE BY MOUTH EVERY DAY, Disp: 90 capsule, Rfl: 0  diclofenac (VOLTAREN) 1 % GEL Gel, APPLY 2 G TOPICALLY 4 (FOUR) TIMES DAILY AS NEEDED, Disp: 300 g, Rfl: 3  diclofenac (VOLTAREN) 75 MG EC tablet, TAKE 1 TABLET BY MOUTH EVERY DAY AS NEEDED FOR PAIN, Disp: 60 tablet, Rfl: 1  cholecalciferol (HM VITAMIN D3) 1000 UNIT tablet, Take 1,000 Units by mouth daily, Disp: , Rfl:   ergocalciferol (VITAMIN D2) 50000 UNIT capsule, Take 1 capsule by mouth once a week  for 8 doses, Disp: 8 capsule, Rfl: 0  calcium carbonate (CALCIUM 600) 600 MG TABS, Take 1 tablet by mouth daily, Disp: 90 tablet, Rfl: 3  fluticasone (FLONASE) 50 MCG/ACT nasal spray, SPRAY 1 SPRAY INTO EACH NOSTRIL EVERY DAY, Disp: 16 mL, Rfl: 1  Diapers & Supplies MISC, Use 2 Devices As Directed  nightly Pull up diapers. Size Medium  Dx-N39.44, Disp: 60 each, Rfl: 11  fexofenadine (ALLEGRA) 180 MG tablet, TAKE 1 TABLET BY MOUTH EVERY DAY, Disp: 90 tablet, Rfl: 3  montelukast (SINGULAIR) 10 MG tablet, TAKE 1 TABLET BY MOUTH EVERY DAY AT NIGHT, Disp: 90 tablet, Rfl: 3  carboxymethylcellulose (REFRESH TEARS) 0.5 % SOLN, Place 1 drop into both eyes 4 (four) times daily, Disp: 1 Bottle, Rfl: 12  artificial tears (LUBRIFRESH P.M.) OINT, Apply to eye, Disp: , Rfl:     lactated ringers infusion, , Intravenous, Continuous, Alphonso Toniann Fail., MD, Last Rate: 30 mL/hr at 06/29/20 0938, New Bag at 06/29/20 0938        Home Medications  (Not in a hospital admission)      Allergies:   Review of Patient's Allergies indicates:   Lactose intolerance*    Other (See Comments)    Comment:bloating    Smoking, Alcohol, Drugs:  Social History    Tobacco Use      Smoking status: Current Every Day Smoker  Packs/day: 0.25        Years: 53.00        Pack years: 13.25      Smokeless tobacco: Never Used      Tobacco comment: currently smoking 5/day    Alcohol use: No      Alcohol/week: 0.0 standard drinks      Drug use: No       PMHx:  Past Medical History:  08/13/2018: Astigmatism of both eyes with presbyopia  No date: Back pain  08/13/2018: Dry eye  05/13/2015: LLQ pain  08/13/2018: Squamous blepharitis of upper and lower eyelids of both   eyes    Vitals  BP 125/72    Pulse 86    Temp 97.8 F (36.6 C) (Temporal)    Resp 18    Ht 5' (1.524 m)    Wt 65.8 kg (145 lb)    LMP  (LMP Unknown)    SpO2 98%    BMI 28.32 kg/m     Review of Systems     Patient summary reviewed and Nursing notes reviewed      Anesthetic History:   negative anesthesia history ROS           Cardiovascular:   Physical Activity in METs greater than 4    Pulmonary: 50 py current smoker, reports shes not taking her montelukast anymore, denies inhaler use           GU/Renal: Negative.    Hepatic: Negative.    Neurological: Negative.    Hematological: Negative.     HEENT: Negative.     Musculoskeletal: Negative.    Psychiatric: Negative.    Constitutional: Negative.        Physical Exam    General     Level of consciousness:  Alert and oriented (time, person, place)   Airway     Mallampati:  II    TM distance:  >3 FB    Mouth opening:  >3 FB    Neck ROM:  Full   Teeth  - normal exam  }   Heart   - normal exam     Lungs - normal exam               Pertinent Labs:   Lab Results   Component Value Date    NA 143 05/28/2019    K 3.9 05/28/2019    CREAT 0.7 05/28/2019    GLUCOSER 85 05/28/2019    WBC 6.0 05/28/2019    HCT 41.4 05/28/2019    PLTA 158 05/28/2019    PT 11.4 04/30/2018    INR 1.0 04/30/2018         Anesthesia Plan    ASA Score:     ASA:  2    Airway:      Mallampati:  II    Mouth opening:  >3 FB    Neck ROM:  Full    TM distance:  >3 FB    Plan: MAC    Other information:     EKG Reviewed: : Yes      Full Stomach Precaution:: No      Post-Plan::  PACU    Anesthesia Assessment and Plan:        NPO since MN, plan for MAC w GA backup    Informed Consent:     Anesthetic plan and risks discussed with:  Patient   Patient Consented        Attending Anesthesiologist Statement:  Reassessed day of surgery: Yes        Assessment made, necessary equipment and appropriate plan in place.

## 2020-06-29 NOTE — Anesthesia Postprocedure Evaluation (Signed)
Anesthesia Post-Operative Evaluation Note    Patient: Victoria Macdonald           Procedure Summary     Date: 06/29/20 Room / Location: [facility] - Gastroenterology    Anesthesia Start: 4481 Anesthesia Stop: 8563    Procedure: GI COLONOSCOPY WITH ESOPHAGOGASTRODUODENOSCOPY (EGD) Diagnosis:     Scheduled Providers: Retta Diones., MD Responsible Provider: Matilde Bash, MD    Anesthesia Type: MAC ASA Status: 2            POST-OPERATIVE EVALUATION    Anesthesia Post Evaluation    Vitals signs in patient's normal range: Yes  Respiratory function stable; airway patent: Yes  Cardiovascular function stable: Yes  Hydration status stable: Yes  Mental status recovered; patient participates in evaluation and/or is at baseline: Yes  Pain control satisfactory: Yes  Nausea and vomiting control satisfactory: Yes    Procedure was labor & delivery no  PostOP disposition PACU  Anesthesia Observation no significant observation      Last vitals  Vitals Value Taken Time   BP 98/68 06/29/20 1125   Temp 97 F (36.1 C) 06/29/20 1121   Pulse 97 06/29/20 1126   Resp 16 06/29/20 1126   SpO2 98 % 06/29/20 1126   Vitals shown include unvalidated device data.

## 2020-06-29 NOTE — PROVATION-GI (Signed)
[facility]  Patient Name: Victoria Macdonald  MRN: [id number]  CSN: [id number]  Date of Birth: 01-18-1946  Admit Type: Outpatient  Age: 74  Gender: Female  Note Status: Finalized  Patient Location: [place]  Referring MD:          Cathe Mons. D'AGATA, MD  Procedure Date:        06/29/2020 10:20:59 AM  Procedure:             Upper GI endoscopy  Endoscopist:           Retta Diones, MD  Indications for Procedure:       Heartburn  Medications:           Monitored Anesthesia Care  Procedure:       Just prior to the procedure, an updated history and physical was done. I        obtained an informed consent from the patient reviewing the risk of the        procedure including (but not limited to) respiratory depression, perforation,        bleeding, discomfort, a possible need for surgery and unexpected reactions to        medications. The patient is aware that test has limitations and may not        detect significant lesions such as cancer or other potential diseases. The        patient was also informed that they might need a repeat upper endoscopy        earlier than standard guidelines if there are changes in their symptoms or        concerning findings noted. A time out was performed with the entire procedure        staff present. The scope was passed under direct vision. Throughout the        procedure, the patient's blood pressure, pulse, and oxygen saturations were        monitored continuously. The [id number] was introduced through the        mouth, and advanced to the third part of duodenum. The upper GI endoscopy was        accomplished without difficulty. The patient tolerated the procedure well.  Findings:       [Classification] present at the gastroesophageal junction. Mucosa was        biopsied with a cold forceps for histology in a targeted manner at the        gastroesophageal junction. One specimen bottle was sent to pathology.        Estimated blood loss was minimal.       A single small  mucosal nodule with a localized distribution was found at the        gastroesophageal junction, 35 cm from the incisors. WILL ARRANGE FOR REMOVAL        IN THE FUTURE AT FOLLOW-UP EGD WITH DR. FAWAZ.       Diffuse moderate inflammation characterized by erythema and friability was        found in the gastric antrum. Biopsies were taken with a cold forceps for        histology. Estimated blood loss was minimal.       No gross lesions were noted in the entire examined duodenum.  Post Procedure Diagnosis:       - Barrett's esophagus. Biopsied.       - Mucosal nodule found in the esophagus.       - Chronic gastritis. Biopsied.       -  No gross lesions in the entire examined duodenum.  Complications:         No immediate complications.  Recommendation:       - Await pathology results.       - Repeat upper endoscopy in 8 weeks to check healing.  Retta Diones., MD  Lendell Caprice Toniann Fail, MD  06/29/2020 11:31:00 AM  This report has been signed electronically.  Number of Addenda: 0  Note Initiated On: 06/29/2020 10:20 AM

## 2020-06-29 NOTE — Discharge Instructions (Signed)
GI CENTER DISCHARGE INSTRUCTIONS     When you return home, you may feel sleepy. Get plenty of rest for the remainder of the day.     If you received sedation for your procedure DO NOT DRIVE, OPERATE MACHINERY OR MAKE IMPORTANT DECISIONS for the reminder of the day.     It is normal after having a COLONOSCOPY to feel a little gassy and bloated, but if you develop SEVERE ABDOMINAL PAIN call your doctor immediately.     It is normal after having a COLONOSCOPY to see a small amount of blood after your first few bowel movements, but if you see a LARGE AMOUNT OF BRIGHT RED BLOOD, call your doctor immediately.     It is normal after having an UPPER ENDOSCOPY to develop a mild sore throat that will last a few days.     If you had a biopsy or Polypectomy you will need o hold aspirin or medications containing aspirin for  {day/s.          If you had a biopsy or Polypectomy you will need to hold any medication such as Advil, Motrin, Naproxin, Ibuprofen etc. for days.      Call your doctor immediately if you develop:   -  Chest Pain   -  Shortness of breath   -  Difficulty swallowing   -  Vomit bright red blood   -  Notice your bowel movements are black or maroon colored   -  You feel weak and tired     Call your physician for any unusual symptoms.     If for any reason you are unable to reach your doctor go to the nearest         [place].     SPECIFIC INSTRUCTIONS:  ***    Follow-up appointment: ***    Prescription: {yes [id number]}   Date MD Name: Telephone:   06/29/2020 No att. providers found ***

## 2020-06-29 NOTE — Addendum Note (Signed)
Addendum  created 06/29/20 1127 by Matilde Bash, MD    Clinical Note Signed

## 2020-06-29 NOTE — Progress Notes (Signed)
dr brown requested a follow up egd with Dr Marcelene Butte to remove the mucosal nodule and reassess the friable area in the gastric antrum    Scheduled on 09/02/20 arrive at 1120 with Dr Marcelene Butte

## 2020-06-29 NOTE — PROVATION-GI (Signed)
[facility]  Patient Name: Victoria Macdonald  MRN: [id number]  CSN: [id number]  Date of Birth: Dec 26, 1945  Admit Type: Outpatient  Age: 74  Gender: Female  Note Status: Finalized  Patient Location: [place]  Referring MD:          Cathe Mons. Marissa Calamity, MD  Procedure Date:        06/29/2020 10:22:40 AM  Procedure:             Colonoscopy  Endoscopist:           Maniyah Moller Toniann Fail, MD  Indications for Procedure:       Personal history of colonic polyps  Medications:           Monitored Anesthesia Care  Procedure:       Just prior to the procedure, an updated history and physical was done. I        obtained an informed consent from the patient reviewing the risk of the        procedure including (but not limited to) respiratory depression, perforation,        bleeding, discomfort, a possible need for surgery and unexpected reactions to        medications. The patient is aware that test has limitations and may not        detect significant lesions such as cancer or other potential diseases. The        patient was also informed that they might need a repeat colonoscopy earlier        than standard guidelines if there are changes in their symptoms or concerning        findings noted. A time out was performed with the entire procedure staff        present. The scope was passed under direct vision. Throughout the procedure,        the patient's blood pressure, pulse, and oxygen saturations were monitored        continuously. The [id number] was introduced through the anus and        advanced to the cecum, identified by appendiceal orifice and ileocecal valve.        The scope was then slowly withdrawn with confirmation of the noted findings.        The colonoscopy was performed without difficulty. The patient tolerated the        procedure well. Scope withdrawal time was 4 minutes.  Findings:       Multiple small and large-mouthed diverticula were found in the sigmoid colon,        descending colon and splenic  flexure.       Non-bleeding internal hemorrhoids were found during retroflexion. The        hemorrhoids were medium-sized.  Post Procedure Diagnosis:       - Diverticulosis in the sigmoid colon, in the descending colon and at the        splenic flexure.       - Non-bleeding internal hemorrhoids.       - No specimens collected.  Complications:         No immediate complications.  Recommendation:       - Await pathology results.       - Repeat colonoscopy in 5 years for surveillance.       - High fiber diet indefinitely.  Retta Diones., MD  Lendell Caprice Toniann Fail, MD  06/29/2020 11:33:50 AM  This report has been signed electronically.  Number of Addenda: 0  Note Initiated On: 06/29/2020 10:22 AM

## 2020-06-30 LAB — SURGICAL PATH SPECIMEN GASTROINTESTINAL

## 2020-07-01 ENCOUNTER — Encounter (HOSPITAL_BASED_OUTPATIENT_CLINIC_OR_DEPARTMENT_OTHER): Payer: Self-pay | Admitting: Family Medicine

## 2020-07-01 ENCOUNTER — Ambulatory Visit: Payer: No Typology Code available for payment source | Attending: Otolaryngology | Admitting: Otolaryngology

## 2020-07-01 ENCOUNTER — Other Ambulatory Visit: Payer: Self-pay

## 2020-07-01 DIAGNOSIS — R0981 Nasal congestion: Secondary | ICD-10-CM | POA: Diagnosis not present

## 2020-07-01 DIAGNOSIS — A048 Other specified bacterial intestinal infections: Secondary | ICD-10-CM

## 2020-07-01 DIAGNOSIS — J3081 Allergic rhinitis due to animal (cat) (dog) hair and dander: Secondary | ICD-10-CM

## 2020-07-01 MED ORDER — BUDESONIDE 32 MCG/ACT NA SUSP
1.00 | Freq: Every day | NASAL | 3 refills | Status: AC
Start: 2020-07-01 — End: 2020-07-31

## 2020-07-01 MED ORDER — TETRACYCLINE HCL 500 MG PO CAPS
500.0000 mg | ORAL_CAPSULE | Freq: Four times a day (QID) | ORAL | 0 refills | Status: AC
Start: 2020-07-01 — End: 2020-07-11

## 2020-07-01 MED ORDER — MONTELUKAST SODIUM 10 MG PO TABS
10.0000 mg | ORAL_TABLET | Freq: Every evening | ORAL | 3 refills | Status: DC
Start: 2020-07-01 — End: 2021-03-02

## 2020-07-01 MED ORDER — METRONIDAZOLE 250 MG PO TABS
250.00 mg | ORAL_TABLET | Freq: Four times a day (QID) | ORAL | 0 refills | Status: AC
Start: 2020-07-01 — End: 2020-07-11

## 2020-07-01 MED ORDER — BISMUTH SUBSALICYLATE 262 MG PO CHEW
524.00 mg | CHEWABLE_TABLET | Freq: Four times a day (QID) | ORAL | 0 refills | Status: AC
Start: 2020-07-01 — End: 2020-07-11

## 2020-07-05 ENCOUNTER — Encounter (HOSPITAL_BASED_OUTPATIENT_CLINIC_OR_DEPARTMENT_OTHER): Payer: Self-pay | Admitting: Gastroenterology

## 2020-07-05 ENCOUNTER — Other Ambulatory Visit (HOSPITAL_BASED_OUTPATIENT_CLINIC_OR_DEPARTMENT_OTHER): Payer: Self-pay | Admitting: Gastroenterology

## 2020-07-05 MED ORDER — AMOXICILLIN 500 MG PO TABS
1000.00 mg | ORAL_TABLET | Freq: Two times a day (BID) | ORAL | 0 refills | Status: AC
Start: 2020-07-05 — End: 2020-07-19

## 2020-07-05 MED ORDER — OMEPRAZOLE 20 MG PO CPDR
20.00 mg | DELAYED_RELEASE_CAPSULE | Freq: Two times a day (BID) | ORAL | 0 refills | Status: AC
Start: 2020-07-05 — End: 2020-07-19

## 2020-07-05 MED ORDER — CLARITHROMYCIN 500 MG PO TABS
500.00 mg | ORAL_TABLET | Freq: Two times a day (BID) | ORAL | 0 refills | Status: AC
Start: 2020-07-05 — End: 2020-07-19

## 2020-07-08 ENCOUNTER — Ambulatory Visit (HOSPITAL_BASED_OUTPATIENT_CLINIC_OR_DEPARTMENT_OTHER): Payer: Self-pay | Admitting: Surgery

## 2020-07-09 ENCOUNTER — Encounter (HOSPITAL_BASED_OUTPATIENT_CLINIC_OR_DEPARTMENT_OTHER): Payer: Self-pay | Admitting: Family Medicine

## 2020-07-13 ENCOUNTER — Other Ambulatory Visit (HOSPITAL_BASED_OUTPATIENT_CLINIC_OR_DEPARTMENT_OTHER): Payer: Self-pay | Admitting: Family Medicine

## 2020-07-13 DIAGNOSIS — J310 Chronic rhinitis: Secondary | ICD-10-CM

## 2020-07-13 NOTE — Telephone Encounter (Signed)
PER Pharmacy, Victoria Macdonald is a 74 year old female has requested a refill of fluticasone nasal spray.      Last Office Visit: 02/26/20 with D'agata, C  Last Physical Exam: 09/27/2017    COLONOSCOPY due on 02/27/2020    Other Med Adult:  Most Recent BP Reading(s)  06/29/20 : (!) 87/60        Cholesterol (mg/dL)   Date Value   03/04/2015 235     LOW DENSITY LIPOPROTEIN DIRECT (mg/dL)   Date Value   03/04/2015 171     HIGH DENSITY LIPOPROTEIN (mg/dL)   Date Value   03/04/2015 45     No results found for: TG      THYROID SCREEN TSH REFLEX FT4 (uIU/mL)   Date Value   02/12/2020 4.110 (H)         No results found for: TSH    No results found for: HGBA1C    No results found for: POCA1C      INR (no units)   Date Value   04/30/2018 1.0       SODIUM (mmol/L)   Date Value   05/28/2019 143       POTASSIUM (mmol/L)   Date Value   05/28/2019 3.9           CREATININE (mg/dL)   Date Value   05/28/2019 0.7       Documented patient preferred pharmacies:    [id number] [address], [place] - [place]  Phone: [phone] Fax: [phone]

## 2020-07-22 ENCOUNTER — Other Ambulatory Visit: Payer: Self-pay

## 2020-07-22 ENCOUNTER — Ambulatory Visit: Payer: No Typology Code available for payment source | Attending: Surgery | Admitting: Surgery

## 2020-07-22 VITALS — BP 120/81 | HR 95 | Temp 98.0°F

## 2020-07-22 DIAGNOSIS — R222 Localized swelling, mass and lump, trunk: Secondary | ICD-10-CM | POA: Insufficient documentation

## 2020-07-22 DIAGNOSIS — K432 Incisional hernia without obstruction or gangrene: Secondary | ICD-10-CM | POA: Insufficient documentation

## 2020-07-22 DIAGNOSIS — Z8719 Personal history of other diseases of the digestive system: Secondary | ICD-10-CM | POA: Diagnosis present

## 2020-07-24 ENCOUNTER — Encounter (HOSPITAL_BASED_OUTPATIENT_CLINIC_OR_DEPARTMENT_OTHER): Payer: Self-pay | Admitting: Family Medicine

## 2020-07-26 NOTE — Telephone Encounter (Signed)
Letter sent to patient    Pt needs appt to follow up on health needs    Victoria Macdonald  [phone]  [place]    Please assist this patient with the following:    Schedule in-person visit for CPEX, time frame: per patient, with D'Agata or team, if appointment not available: Schedule with alternate provider or at alternate location per patient preference

## 2020-07-26 NOTE — Progress Notes (Signed)
74 year old female who presents for evaluation of incisional hernia the patient previously had done previously.  She currently is having symptoms in the superior portion of her incision.  [place] interpreter utilized.  No nausea no vomiting occasional abdominal pain.  Physical examination reveals small lump in the superior portion of the incision not entirely reducible difficult to ascertain fascial edges on physical examination alone.  There is no outward skin changes to worry have concerns for incarceration or strangulation.  She is having normal bowel movements.  At this point given the timing as well as physical exam findings it would be appropriate to get some cross-sectional imaging to delineate the internal anatomy and possible previous mesh placement.  I have ordered a CT scan during this clinic visit and was scheduled and she will follow-up with [place] after this performed to discuss next steps.  The patient understands and agrees with this plan.      I spent a total of 25 minutes on this visit on the date of service (total time includes all activities performed on the date of service)

## 2020-07-27 ENCOUNTER — Telehealth (HOSPITAL_BASED_OUTPATIENT_CLINIC_OR_DEPARTMENT_OTHER): Payer: Self-pay | Admitting: Family Medicine

## 2020-07-27 NOTE — Telephone Encounter (Signed)
Schedule in-person visit for CPEX, time frame: per patient, with D'Agata or team, if appointment not available: Schedule with alternate provider or at alternate location per patient preference        I called the patient and Left a voicemail to return call    If patient calls back, please: need cpex appt

## 2020-07-29 ENCOUNTER — Ambulatory Visit (HOSPITAL_BASED_OUTPATIENT_CLINIC_OR_DEPARTMENT_OTHER): Payer: Self-pay | Admitting: Surgery

## 2020-08-05 ENCOUNTER — Ambulatory Visit (HOSPITAL_BASED_OUTPATIENT_CLINIC_OR_DEPARTMENT_OTHER): Payer: Self-pay | Admitting: Surgery

## 2020-08-10 ENCOUNTER — Other Ambulatory Visit (HOSPITAL_BASED_OUTPATIENT_CLINIC_OR_DEPARTMENT_OTHER): Payer: Self-pay

## 2020-08-11 ENCOUNTER — Ambulatory Visit
Admission: RE | Admit: 2020-08-11 | Discharge: 2020-08-11 | Disposition: A | Discharge status: Home / Self Care | Payer: No Typology Code available for payment source | Attending: Surgery | Admitting: Surgery

## 2020-08-11 ENCOUNTER — Other Ambulatory Visit: Payer: Self-pay

## 2020-08-11 DIAGNOSIS — K432 Incisional hernia without obstruction or gangrene: Secondary | ICD-10-CM

## 2020-08-11 DIAGNOSIS — N1330 Unspecified hydronephrosis: Secondary | ICD-10-CM | POA: Diagnosis present

## 2020-08-11 DIAGNOSIS — K5790 Diverticulosis of intestine, part unspecified, without perforation or abscess without bleeding: Secondary | ICD-10-CM | POA: Diagnosis present

## 2020-08-11 LAB — POC CREATININE
POC CREATININE: 0.7 mg/dl (ref 0.4–1.2)
POC GFR: 91 mL/min (ref 60–?)

## 2020-08-11 MED ORDER — IOHEXOL 9 MG/ML PO SOLN
500.00 mL | ORAL | Status: AC | PRN
Start: 2020-08-11 — End: 2020-08-11
  Administered 2020-08-11 (×2): 500 mL via ORAL

## 2020-08-11 MED ORDER — IOHEXOL 350 MG/ML IV SOLN
85.00 mL | Freq: Once | INTRAVENOUS | Status: AC
Start: 2020-08-11 — End: 2020-08-11
  Administered 2020-08-11: 85 mL via INTRAVENOUS

## 2020-08-11 MED ORDER — NORMAL SALINE FLUSH 0.9 % IV SOLN
55.00 mL | Freq: Once | INTRAVENOUS | Status: AC
Start: 2020-08-11 — End: 2020-08-11
  Administered 2020-08-11: 55 mL via INTRAVENOUS

## 2020-08-19 ENCOUNTER — Other Ambulatory Visit: Payer: Self-pay

## 2020-08-19 ENCOUNTER — Ambulatory Visit: Payer: No Typology Code available for payment source | Attending: Surgery | Admitting: Surgery

## 2020-08-19 ENCOUNTER — Ambulatory Visit
Admission: RE | Admit: 2020-08-19 | Discharge: 2020-08-19 | Disposition: A | Discharge status: Home / Self Care | Payer: No Typology Code available for payment source | Attending: Family Medicine | Admitting: Family Medicine

## 2020-08-19 VITALS — Temp 96.6°F

## 2020-08-19 DIAGNOSIS — A048 Other specified bacterial intestinal infections: Secondary | ICD-10-CM

## 2020-08-19 DIAGNOSIS — R1010 Upper abdominal pain, unspecified: Secondary | ICD-10-CM

## 2020-08-19 DIAGNOSIS — R109 Unspecified abdominal pain: Secondary | ICD-10-CM

## 2020-08-19 LAB — HELICOBACTER PYLORI STOOL AG

## 2020-08-19 NOTE — Progress Notes (Signed)
Pt informed of results by MyChart

## 2020-08-20 ENCOUNTER — Encounter (HOSPITAL_BASED_OUTPATIENT_CLINIC_OR_DEPARTMENT_OTHER): Payer: Self-pay | Admitting: Family Medicine

## 2020-08-20 DIAGNOSIS — J310 Chronic rhinitis: Secondary | ICD-10-CM

## 2020-08-20 DIAGNOSIS — M2550 Pain in unspecified joint: Secondary | ICD-10-CM

## 2020-08-20 MED ORDER — FLUTICASONE PROPIONATE 50 MCG/ACT NA SUSP
1.0000 | Freq: Every day | NASAL | 11 refills | Status: DC
Start: 2020-08-20 — End: 2021-07-05

## 2020-08-20 MED ORDER — DICLOFENAC SODIUM 75 MG PO TBEC
DELAYED_RELEASE_TABLET | ORAL | 1 refills | Status: DC
Start: 2020-08-20 — End: 2020-11-09

## 2020-08-20 NOTE — Telephone Encounter (Signed)
PER Patient (self), Victoria Macdonald is a 74 year old female has requested a refill of Flonase .      Last Office Visit: 02/26/20 with D'agata  Last Physical Exam: 09/27/17    COLONOSCOPY due on 02/27/2020    Other Med Adult:  Most Recent BP Reading(s)  07/22/20 : 120/81        Cholesterol (mg/dL)   Date Value   03/04/2015 235     LOW DENSITY LIPOPROTEIN DIRECT (mg/dL)   Date Value   03/04/2015 171     HIGH DENSITY LIPOPROTEIN (mg/dL)   Date Value   03/04/2015 45     No results found for: TG      THYROID SCREEN TSH REFLEX FT4 (uIU/mL)   Date Value   02/12/2020 4.110 (H)         No results found for: TSH    No results found for: HGBA1C    No results found for: POCA1C      INR (no units)   Date Value   04/30/2018 1.0       SODIUM (mmol/L)   Date Value   05/28/2019 143       POTASSIUM (mmol/L)   Date Value   05/28/2019 3.9           CREATININE (mg/dL)   Date Value   05/28/2019 0.7     POC CREATININE (mg/dl)   Date Value   08/11/2020 0.7       Documented patient preferred pharmacies:    [id number] [address], [place]  Phone: [phone] Fax: [phone]

## 2020-08-20 NOTE — Telephone Encounter (Signed)
Message from [place]:   From: Lolita Rieger   Sent: Fri Aug 20, 2020 11:26 AM   To: Centralized Refill Pool  Subject: Medication Renewal Request  Refills have been requested for the following medications:   Other - Diclofenac sodium 75 mg 90 tablets    Preferred pharmacy: [id number] [place], [place] - [place]      Medication renewals requested in this message routed separately:     fluticasone (FLONASE) 50 MCG/ACT nasal spray Fredirick Lathe, MD]

## 2020-08-20 NOTE — Telephone Encounter (Signed)
PER Patient (self), Victoria Macdonald is a 74 year old female has requested a refill of Diclofenac 75 mg.      Last Office Visit: 02/26/20 with D'agata, C  Last Physical Exam: 09/27/17    COLONOSCOPY due on 02/27/2020    Other Med Adult:  Most Recent BP Reading(s)  07/22/20 : 120/81        Cholesterol (mg/dL)   Date Value   03/04/2015 235     LOW DENSITY LIPOPROTEIN DIRECT (mg/dL)   Date Value   03/04/2015 171     HIGH DENSITY LIPOPROTEIN (mg/dL)   Date Value   03/04/2015 45     No results found for: TG      THYROID SCREEN TSH REFLEX FT4 (uIU/mL)   Date Value   02/12/2020 4.110 (H)         No results found for: TSH    No results found for: HGBA1C    No results found for: POCA1C      INR (no units)   Date Value   04/30/2018 1.0       SODIUM (mmol/L)   Date Value   05/28/2019 143       POTASSIUM (mmol/L)   Date Value   05/28/2019 3.9           CREATININE (mg/dL)   Date Value   05/28/2019 0.7     POC CREATININE (mg/dl)   Date Value   08/11/2020 0.7       Documented patient preferred pharmacies:    [id number] [address], [place] - [place]  Phone: [phone] Fax: [phone]

## 2020-08-23 NOTE — Progress Notes (Signed)
73 year old female who presents today for discussion and evaluation of results of recent CAT scan.  I personally reviewed the images of the CAT scan that reveals no evidence of a incisional hernia.  She has been complaining of some bulging and pain superiorly from her previous incision for unclear abdominal surgery in 1985.  She is not having any evidence of GI obstructive symptoms or changes in her bowel patterns.  She does notice a bulging in the upper abdomen however it is unclear on physical examination whether or not this was truly a fascial defect versus scar tissue from previous surgery.  As result a CT scan was obtained.  I personally reviewed the images there is no evidence of a frank fascial defect there is some scar tissue above the incision with some thinning of the fascia.  I explained to the patient that there currently is no hernia however hernia is possible given the overall attenuation of the fascia at this level.  At this point no surgical intervention is warranted however we will continue to follow-up with her to see if her symptoms change or a hernia does develop in the future.  A 35-month follow-up was made and she will follow-up with [place] at this time or sooner if new changes occur.  She understands and agrees with this plan.    I spent a total of 33 minutes on this visit on the date of service (total time includes all activities performed on the date of service)

## 2020-09-02 ENCOUNTER — Other Ambulatory Visit (HOSPITAL_BASED_OUTPATIENT_CLINIC_OR_DEPARTMENT_OTHER): Payer: Self-pay

## 2020-09-08 ENCOUNTER — Ambulatory Visit (HOSPITAL_BASED_OUTPATIENT_CLINIC_OR_DEPARTMENT_OTHER): Payer: Self-pay | Admitting: Physician Assistant

## 2020-09-25 NOTE — Progress Notes (Signed)
HPI: 74 year old female Victoria Macdonald complains of chronic nasal issues with chronic issues with chronic rhinitis.     Symptom Relief No  Symptom Exacerbation No    PMH of Asthma  No.  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   eyes    Review of Patient's Allergies indicates:   Lactose intolerance*    Other (See Comments)    Comment:bloating    diclofenac (VOLTAREN) 1 % GEL Gel, APPLY 2 G TOPICALLY 4 (FOUR) TIMES DAILY AS NEEDED, Disp: 300 g, Rfl: 3  cholecalciferol (HM VITAMIN D3) 1000 UNIT tablet, Take 1,000 Units by mouth daily, Disp: , Rfl:   ergocalciferol (VITAMIN D2) 50000 UNIT capsule, Take 1 capsule by mouth once a week  for 8 doses, Disp: 8 capsule, Rfl: 0  calcium carbonate (CALCIUM 600) 600 MG TABS, Take 1 tablet by mouth daily, Disp: 90 tablet, Rfl: 3  Diapers & Supplies MISC, Use 2 Devices As Directed nightly Pull up diapers. Size Medium  Dx-N39.44, Disp: 60 each, Rfl: 11  montelukast (SINGULAIR) 10 MG tablet, TAKE 1 TABLET BY MOUTH EVERY DAY AT NIGHT, Disp: 90 tablet, Rfl: 3  artificial tears (LUBRIFRESH P.M.) OINT, Apply to eye, Disp: , Rfl:     No current facility-administered medications on file prior to visit.      Social and family history reviewed either in EPIC or with patient and are not contributory unless specifically noted in HPI.    Family History of Allergies No.    Smoking: No.    Review of Systems  Constitutional normal.  Eyes/Blurry vision No.  CV/Palpitations No.  Resp/SOB No.  Endocrine/Feeling tired No.  MSk/Pain No.  Integ/Skin lesions No.  Psych normal.  GI/Heartburn No.  Neurologic normal.  Hem/Easy Bruising No.  Environmental Allergy No.    Physical Exam  There were no vitals filed for this visit.    General: WD WN female with a normal voice.  Face: No lesions.  Facial  Strength: Normal and symmetric.  Eyes: PERRLA and EOMI.  Ear R: No lesions. Cerumen: none / minimal. Canal clear. TM clear.   Ear L:  No lesions. Cerumen: none / minimal. Canal clear. TM clear.  Nose:  Septum mildly deviated and with enlarged inferior turbinates.   No polyps or masses.  Nasopharynx:  CNV    Oral Cavity/Oropharynx:  Mucous membranes moist.  Tonsils 0+.  Teeth in good condition.  Tongue no lesions.  Neck:  Trachea midline.  No palpable masses.  Thyroid: Normal to palpation.  Lymph:  No palpable nodes.  Subman glands:  Normal size, non-tender.  Parotids: Normal size, no masses or nodes.      A/P  74 year old female with chronic nasal congestion and with chronic rhinitis, and with nasal airway obstruction.       Risks and benefits of testing and treatment were discussed with the patient and include The patient will start Singulair 10 mg nightly for allergic rhinitis given their symptoms of nasal congestion, rhinorrhea, post nasal drip which has not completely responded to treatment with antihistamines and/or nasal steroid sprays. The MOA of Singulair was discussed as well as the possible side effects including depression, anxiety, and the black box warning of suicidal ideation.  If not improved, may need  to be a candidate for allergy testing or trying other medications. Topical steroid spray and with risk of steroids.       The plan will be as follows and patient also in agreement with this plan - RTC in 3-4 months.    Claudie Revering, MD, MBA  Otolaryngology- Head and Neck Surgery

## 2020-11-09 ENCOUNTER — Other Ambulatory Visit (HOSPITAL_BASED_OUTPATIENT_CLINIC_OR_DEPARTMENT_OTHER): Payer: Self-pay | Admitting: Family Medicine

## 2020-11-09 DIAGNOSIS — M2550 Pain in unspecified joint: Secondary | ICD-10-CM

## 2020-11-09 NOTE — Telephone Encounter (Signed)
PER Pharmacy, Bobbie Virden is a 75 year old female has requested a refill of      -  Diclofenac        Last Office Visit: 02/26/2020 with D'Agata, C   Last Physical Exam: 09/27/2017     COLONOSCOPY due on 02/27/2020     Other Med Adult:  Most Recent BP Reading(s)  07/22/20 : 120/81        Cholesterol (mg/dL)   Date Value   03/04/2015 235     LOW DENSITY LIPOPROTEIN DIRECT (mg/dL)   Date Value   03/04/2015 171     HIGH DENSITY LIPOPROTEIN (mg/dL)   Date Value   03/04/2015 45     No results found for: TG      THYROID SCREEN TSH REFLEX FT4 (uIU/mL)   Date Value   02/12/2020 4.110 (H)         No results found for: TSH    No results found for: HGBA1C    No results found for: POCA1C      INR (no units)   Date Value   04/30/2018 1.0       SODIUM (mmol/L)   Date Value   05/28/2019 143       POTASSIUM (mmol/L)   Date Value   05/28/2019 3.9           CREATININE (mg/dL)   Date Value   05/28/2019 0.7     POC CREATININE (mg/dl)   Date Value   08/11/2020 0.7        Documented patient preferred pharmacies:    [id number] [address], [place] - [place]  Phone: [phone] Fax: [phone]

## 2020-12-15 ENCOUNTER — Encounter (HOSPITAL_BASED_OUTPATIENT_CLINIC_OR_DEPARTMENT_OTHER): Payer: Self-pay | Admitting: Family Medicine

## 2020-12-27 ENCOUNTER — Encounter (HOSPITAL_BASED_OUTPATIENT_CLINIC_OR_DEPARTMENT_OTHER): Payer: Self-pay | Admitting: Surgery

## 2021-01-03 ENCOUNTER — Ambulatory Visit (HOSPITAL_BASED_OUTPATIENT_CLINIC_OR_DEPARTMENT_OTHER): Payer: Self-pay | Admitting: Thoracic Surgery (Cardiothoracic Vascular Surgery)

## 2021-01-19 ENCOUNTER — Other Ambulatory Visit (HOSPITAL_BASED_OUTPATIENT_CLINIC_OR_DEPARTMENT_OTHER): Payer: Self-pay | Admitting: Family Medicine

## 2021-01-25 ENCOUNTER — Encounter (HOSPITAL_BASED_OUTPATIENT_CLINIC_OR_DEPARTMENT_OTHER): Payer: Self-pay | Admitting: Family Medicine

## 2021-01-25 DIAGNOSIS — J310 Chronic rhinitis: Secondary | ICD-10-CM

## 2021-01-25 DIAGNOSIS — M2550 Pain in unspecified joint: Secondary | ICD-10-CM

## 2021-01-25 MED ORDER — FEXOFENADINE HCL 180 MG PO TABS
180.0000 mg | ORAL_TABLET | Freq: Every day | ORAL | 3 refills | Status: DC
Start: 2021-01-25 — End: 2022-01-09

## 2021-01-25 MED ORDER — DICLOFENAC SODIUM 75 MG PO TBEC
DELAYED_RELEASE_TABLET | ORAL | 1 refills | Status: DC
Start: 2021-01-25 — End: 2021-11-15

## 2021-01-25 NOTE — Telephone Encounter (Signed)
PER Patient (self), Victoria Macdonald is a 75 year old female has requested a refill of                -  Fexofenadine, diclofenac tabs.      Last Office Visit: 02/26/2020 with pcp  Last Physical Exam: 09/27/2017    COLONOSCOPY due on 02/27/2020    Other Med Adult:  Most Recent BP Reading(s)  07/22/20 : 120/81        Cholesterol (mg/dL)   Date Value   03/04/2015 235     LOW DENSITY LIPOPROTEIN DIRECT (mg/dL)   Date Value   03/04/2015 171     HIGH DENSITY LIPOPROTEIN (mg/dL)   Date Value   03/04/2015 45     No results found for: TG      THYROID SCREEN TSH REFLEX FT4 (uIU/mL)   Date Value   02/12/2020 4.110 (H)         No results found for: TSH    No results found for: HGBA1C    No results found for: POCA1C      INR (no units)   Date Value   04/30/2018 1.0       SODIUM (mmol/L)   Date Value   05/28/2019 143       POTASSIUM (mmol/L)   Date Value   05/28/2019 3.9           CREATININE (mg/dL)   Date Value   05/28/2019 0.7     POC CREATININE (mg/dl)   Date Value   08/11/2020 0.7       Documented patient preferred pharmacies:    [id number] [address], [place] - [place]  Phone: [phone] Fax: [phone]

## 2021-01-25 NOTE — Telephone Encounter (Signed)
Message from [place]:   From: Lolita Rieger   Sent: Tue Jan 25, 2021 4:15 PM   To: Centralized Refill Pool  Subject: Medication Renewal Request  Refills have been requested for the following medications:   Other - fexofenadine 180 mg tablet 90 days    Preferred pharmacy: [id number] [place], [place]

## 2021-01-26 ENCOUNTER — Ambulatory Visit (HOSPITAL_BASED_OUTPATIENT_CLINIC_OR_DEPARTMENT_OTHER): Payer: Self-pay | Admitting: Registered Nurse

## 2021-01-26 ENCOUNTER — Other Ambulatory Visit: Payer: Self-pay

## 2021-01-26 NOTE — Telephone Encounter (Signed)
Reason for Disposition   Constant abdominal pain lasting > 2 hours    Answer Assessment - Initial Assessment Questions  Called pt back;   She c/o of left low abdomen pain for 2 days. Pain yesterday was worst. Constant sudden pain at 7/10 today.   9. RELIEVING/AGGRAVATING FACTORS: "What makes it better or worse?" (e.g., movement, antacids, bowel movement)  Movement   10. OTHER SYMPTOMS: "Has there been any vomiting, diarrhea, constipation, or urine problems?"        No other symptoms   11. PREGNANCY: "Is there any chance you are pregnant?" "When was your last menstrual period?"  NO   Pt is requesting to be seen. Given this much pain, pt is advised to go to [facility] / ER. No available appt today or tomorrow.   Pt said she will figure something hung up.    Protocols used: ADULT ABDOMINAL PAIN - FEMALE-A-[place]

## 2021-01-26 NOTE — Telephone Encounter (Signed)
Regarding: Severe - Abdominal Pain   ----- Message from Osie Bond sent at 01/26/2021  1:30 PM EDT -----  Victoria Macdonald [id number], 75 year old, female    Calls today:  Sick    What are the symptoms Pt is requesting to schedule an appt with PCP ((prefers) or any provider regarding to left side abdominal pain. Pt stated "I'm in a lot of pain, discomfort and It was really severe yesterday and I don't know if it's my intestine that is hurting me, It's hard to explain".     How long has patient been sick? N/A  What has pt. tried at home N/A  Person calling on behalf of patient: Patient (self)    Rod Can NUMBER: [phone]      Patient's language of care: English    Patient does not need an interpreter.     Patient's PCP: Fredirick Lathe, MD

## 2021-01-27 ENCOUNTER — Ambulatory Visit (HOSPITAL_BASED_OUTPATIENT_CLINIC_OR_DEPARTMENT_OTHER): Payer: No Typology Code available for payment source | Admitting: Surgery

## 2021-01-28 ENCOUNTER — Telehealth (HOSPITAL_BASED_OUTPATIENT_CLINIC_OR_DEPARTMENT_OTHER): Payer: Self-pay | Admitting: Family Medicine

## 2021-01-28 ENCOUNTER — Encounter (HOSPITAL_BASED_OUTPATIENT_CLINIC_OR_DEPARTMENT_OTHER): Payer: Self-pay

## 2021-01-28 NOTE — Telephone Encounter (Signed)
Televisit scheduled for 4/12, pt agreed. She explained she is very frustrated and feels nobody is following up and helping to coordinate her care. She was asked to get a colonoscopy, GI referral and she wasn't sure how to get this done. Apt with pcp also scheduled for f/u 5/11 and can be canceled if not needed but can catch up with pcp in person. Pt agreed    She asked to cancel her apt with Dr Seward Speck because she did not want to drive from [facility] or felt she needed it at this time. Apt cancelled

## 2021-01-31 ENCOUNTER — Ambulatory Visit (HOSPITAL_BASED_OUTPATIENT_CLINIC_OR_DEPARTMENT_OTHER): Payer: Self-pay | Admitting: Student in an Organized Health Care Education/Training Program

## 2021-02-01 ENCOUNTER — Ambulatory Visit: Payer: No Typology Code available for payment source | Attending: Family Medicine | Admitting: Nurse Practitioner

## 2021-02-01 ENCOUNTER — Encounter (HOSPITAL_BASED_OUTPATIENT_CLINIC_OR_DEPARTMENT_OTHER): Payer: Self-pay | Admitting: Nurse Practitioner

## 2021-02-01 DIAGNOSIS — K5792 Diverticulitis of intestine, part unspecified, without perforation or abscess without bleeding: Secondary | ICD-10-CM | POA: Diagnosis present

## 2021-02-01 NOTE — Progress Notes (Signed)
CC: Diverticulitis     Victoria Macdonald is a 75 year old female presenting for       Diverticulitis  Seen in ER in [facility] on 4/6 for recurrent left sided abdominal pain, CT showed Diverticulitis, started on Levaquin and Flagyl  Pain improved, GI upset with abx but tolerating  Very upset as feels noone has listened to her for years in regard to abdominal pin, discussed results from previous studies  Requesting GI referral  Currently denies fever, chills, abdominal pain, urinary sxs, black or tarry stools.     ROS: As per HPI, all other systems reviewed and negative     Patient Active Problem List:     Right low back pain     Tobacco use disorder     Sessile colonic polyp     Adjustment disorder with depressed mood     Osteoporosis     GAD (generalized anxiety disorder)     Gastroesophageal reflux disease without esophagitis     Lung nodules     Adhesive capsulitis of left shoulder     Nasal congestion     HCV antibody positive     Hepatitis B surface antigen positive     H. pylori infection     Postmenopausal bleeding     Pseudophakia of both eyes     Astigmatism of both eyes with presbyopia     Dry eye     Squamous blepharitis of upper and lower eyelids of both eyes     Arthralgia     Urinary incontinence, nocturnal enuresis       Review of patient's family history indicates:  Problem: Cancer - Other      Relation: Sister          Age of Onset: (Not Specified)          Comment: Uterus, ovarian, intestinal, liver  Problem: Glaucoma      Relation: Brother          Age of Onset: (Not Specified)  Problem: OTHER      Relation: Sister          Age of Onset: (Not Specified)          Comment: Dementia       fexofenadine (ALLEGRA) 180 MG tablet, Take 1 tablet by mouth daily, Disp: 90 tablet, Rfl: 3  diclofenac (VOLTAREN) 75 MG EC tablet, TAKE 1 TABLET BY MOUTH EVERY DAY AS NEEDED FOR PAIN, Disp: 30 tablet, Rfl: 1  fluticasone (FLONASE) 50 MCG/ACT nasal spray, 1 spray by Each Nostril route daily, Disp: 1 each,  Rfl: 11  montelukast (SINGULAIR) 10 MG tablet, Take 1 tablet by mouth nightly, Disp: 30 tablet, Rfl: 3  diclofenac (VOLTAREN) 1 % GEL Gel, APPLY 2 G TOPICALLY 4 (FOUR) TIMES DAILY AS NEEDED, Disp: 300 g, Rfl: 3  cholecalciferol (HM VITAMIN D3) 1000 UNIT tablet, Take 1,000 Units by mouth daily, Disp: , Rfl:   ergocalciferol (VITAMIN D2) 50000 UNIT capsule, Take 1 capsule by mouth once a week  for 8 doses, Disp: 8 capsule, Rfl: 0  calcium carbonate (CALCIUM 600) 600 MG TABS, Take 1 tablet by mouth daily, Disp: 90 tablet, Rfl: 3  montelukast (SINGULAIR) 10 MG tablet, TAKE 1 TABLET BY MOUTH EVERY DAY AT NIGHT, Disp: 90 tablet, Rfl: 3  artificial tears (LUBRIFRESH P.M.) OINT, Apply to eye, Disp: , Rfl:     No current facility-administered medications on file prior to visit.       Review of  Patient's Allergies indicates:   Lactose intolerance*    Other (See Comments)    Comment:bloating     Social History    Tobacco Use      Smoking status: Current Every Day Smoker        Packs/day: 0.25        Years: 53.00        Pack years: 13.25      Smokeless tobacco: Never Used      Tobacco comment: currently smoking 5/day    Alcohol use: No      Alcohol/week: 0.0 standard drinks    Drug use: No         Exam - vitals deferred in setting of COVID pandemic  Speaking in complete sentences, breathing comfortably       Assessment/Plan:  1. Diverticulitis  75 year old calls today for ER followup. Seen in [facility] ER for recurrent abdominal pain on 4/6, CT showed diverticulitis, started on abx. Pain resolving, GI upset from meds, discussed bland diet, hydration, rest. referral placed to GI  - REFERRAL TO GASTROENTEROLOGY ( INT)

## 2021-02-02 ENCOUNTER — Ambulatory Visit (HOSPITAL_BASED_OUTPATIENT_CLINIC_OR_DEPARTMENT_OTHER): Payer: No Typology Code available for payment source | Admitting: Nurse Practitioner

## 2021-02-03 ENCOUNTER — Ambulatory Visit (HOSPITAL_BASED_OUTPATIENT_CLINIC_OR_DEPARTMENT_OTHER): Payer: No Typology Code available for payment source | Admitting: Otolaryngology

## 2021-02-03 ENCOUNTER — Ambulatory Visit (HOSPITAL_BASED_OUTPATIENT_CLINIC_OR_DEPARTMENT_OTHER): Payer: No Typology Code available for payment source | Admitting: Surgery

## 2021-02-22 ENCOUNTER — Telehealth (HOSPITAL_BASED_OUTPATIENT_CLINIC_OR_DEPARTMENT_OTHER): Payer: Self-pay | Admitting: Family Medicine

## 2021-02-22 NOTE — Telephone Encounter (Signed)
Appointment scheduled for: Gastroenterology    Specialty Location:   [facility]                                                   Specialist's name: Ilean Skill          Specialist's NPI#: [id number]                           Specialty Phone Number: [phone]     Specialty Fax Number: [phone]    Reason for appointment: diverticulitis    Day of appt:                                     Date of appt: 02/22/2021    Time of appt:    Patient Notification:  Incoming fax

## 2021-03-02 ENCOUNTER — Encounter (HOSPITAL_BASED_OUTPATIENT_CLINIC_OR_DEPARTMENT_OTHER): Payer: Self-pay | Admitting: Family Medicine

## 2021-03-02 ENCOUNTER — Ambulatory Visit: Payer: No Typology Code available for payment source | Attending: Family Medicine | Admitting: Family Medicine

## 2021-03-02 ENCOUNTER — Other Ambulatory Visit: Payer: Self-pay

## 2021-03-02 VITALS — BP 118/72 | HR 92 | Temp 96.9°F | Wt 144.8 lb

## 2021-03-02 DIAGNOSIS — Z13220 Encounter for screening for lipoid disorders: Secondary | ICD-10-CM

## 2021-03-02 DIAGNOSIS — R7689 Other specified abnormal immunological findings in serum: Secondary | ICD-10-CM

## 2021-03-02 DIAGNOSIS — F17210 Nicotine dependence, cigarettes, uncomplicated: Secondary | ICD-10-CM | POA: Diagnosis present

## 2021-03-02 DIAGNOSIS — K2270 Barrett's esophagus without dysplasia: Secondary | ICD-10-CM | POA: Insufficient documentation

## 2021-03-02 DIAGNOSIS — R0981 Nasal congestion: Secondary | ICD-10-CM

## 2021-03-02 DIAGNOSIS — K5730 Diverticulosis of large intestine without perforation or abscess without bleeding: Secondary | ICD-10-CM

## 2021-03-02 DIAGNOSIS — R918 Other nonspecific abnormal finding of lung field: Secondary | ICD-10-CM

## 2021-03-02 DIAGNOSIS — R768 Other specified abnormal immunological findings in serum: Secondary | ICD-10-CM | POA: Diagnosis present

## 2021-03-02 DIAGNOSIS — F17200 Nicotine dependence, unspecified, uncomplicated: Secondary | ICD-10-CM | POA: Diagnosis present

## 2021-03-02 MED ORDER — MONTELUKAST SODIUM 10 MG PO TABS
10.0000 mg | ORAL_TABLET | Freq: Every evening | ORAL | 0 refills | Status: DC
Start: 2021-03-02 — End: 2021-07-03

## 2021-03-02 NOTE — Progress Notes (Signed)
Family Medicine Office Note    CC:  Patient presents with:  Follow Up  Imm/Inj: Decline  Colonoscopy Screening: Schedule 03/10/21 @ [facility]         HPI:    Diverticulosis  Recent diverticulitis  Next colonoscopy scheduled 03/10/21 at [facility]  Was feeling much better now with some occasional lower abdominal pain  No blood stools, diarrhea or constipation    Nasal congestion  Seen by ENT last year  Started on singulair  Does not remember if she took it or if it helped    Due for CT Lung screening  Last CT Lung 05/2019  LUNG-RADS category: 2 - Benign appearance or behavior     Hep B with fatty liver  Pt did not follow up with ID for regular [place] and HBV quant during COVID  Last [place] 05/2018  Last Elastography- 05/2018- F3    Declines any immunizations today    May not be able to get labs done if ride is here  .     EXAM:  BP 118/72    Pulse 92    Temp 96.9 F (36.1 C) (Temporal)    Wt 65.7 kg (144 lb 12.8 oz)    LMP  (LMP Unknown)    SpO2 97%    BMI 28.28 kg/m   GEN: Well appearing, NAD  ABD: TTP in L upper and L lower quadrants with voluntary guarding. No rebound. No mass appreciated. Bowel sounds are present   MOOD: Normal thought content, speech, affect, mood and dress are noted.    ASSESSMENT/PLAN:    Victoria Macdonald is a 75 year old female who presents for    1. 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 Lung nodules  Due for repeat CT. Order placed  - CT LUNG SCREENING FOLLOW UP; Future    3. Tobacco use disorder  Deferred full discussion today due to time  Pt continues to need CT Lung screening    4. Nasal congestion  Seen by ENT in last year. REcommend montelukast and unclear if effective  Will trial for 3 months. If not improved,  consider return visit with ENT  - montelukast (SINGULAIR) 10 MG tablet; Take 1 tablet by mouth nightly TAKE 1 TABLET BY MOUTH EVERY DAY AT NIGHT  Dispense: 90 tablet; Refill: 0    5. Hepatitis B surface antigen positive  Low viral load in 2019. Due for Labs and [place] for monitoring.  Consider follow up with ID pending results  - US ABDOMEN COMPLETE; Future  - CBC WITH PLATELET; Future  - COLLECTION VENOUS BLOOD VENIPUNCTURE; Future  - COMPREHENSIVE METABOLIC PANEL; Future  - HEPATITIS B PCR QUANT; Future    6. Lipid screening  Routine screen due  - LIPID PANEL; Future      **AVS given and patient instructions reviewed with patient, who had the opportunity to ask questions. The pt verbalizes and demonstrated understanding. Advised to call/RTC with concerns.

## 2021-03-17 ENCOUNTER — Other Ambulatory Visit (HOSPITAL_BASED_OUTPATIENT_CLINIC_OR_DEPARTMENT_OTHER): Payer: Self-pay

## 2021-03-17 ENCOUNTER — Ambulatory Visit (HOSPITAL_BASED_OUTPATIENT_CLINIC_OR_DEPARTMENT_OTHER): Payer: Self-pay

## 2021-03-18 ENCOUNTER — Ambulatory Visit (HOSPITAL_BASED_OUTPATIENT_CLINIC_OR_DEPARTMENT_OTHER)
Admission: RE | Admit: 2021-03-18 | Discharge: 2021-03-18 | Disposition: A | Discharge status: Home / Self Care | Payer: No Typology Code available for payment source | Source: Ambulatory Visit

## 2021-03-18 ENCOUNTER — Other Ambulatory Visit: Payer: Self-pay

## 2021-03-18 ENCOUNTER — Other Ambulatory Visit (HOSPITAL_BASED_OUTPATIENT_CLINIC_OR_DEPARTMENT_OTHER): Payer: Self-pay | Admitting: Family Medicine

## 2021-03-18 ENCOUNTER — Ambulatory Visit
Admission: RE | Admit: 2021-03-18 | Discharge: 2021-03-18 | Disposition: A | Discharge status: Home / Self Care | Payer: No Typology Code available for payment source | Attending: Family Medicine | Admitting: Family Medicine

## 2021-03-18 ENCOUNTER — Telehealth (HOSPITAL_BASED_OUTPATIENT_CLINIC_OR_DEPARTMENT_OTHER): Payer: Self-pay | Admitting: Family Medicine

## 2021-03-18 DIAGNOSIS — R911 Solitary pulmonary nodule: Secondary | ICD-10-CM | POA: Diagnosis present

## 2021-03-18 DIAGNOSIS — Z87891 Personal history of nicotine dependence: Secondary | ICD-10-CM | POA: Diagnosis present

## 2021-03-18 DIAGNOSIS — K7689 Other specified diseases of liver: Secondary | ICD-10-CM | POA: Diagnosis present

## 2021-03-18 DIAGNOSIS — R7689 Other specified abnormal immunological findings in serum: Secondary | ICD-10-CM

## 2021-03-18 DIAGNOSIS — B1910 Unspecified viral hepatitis B without hepatic coma: Secondary | ICD-10-CM | POA: Diagnosis present

## 2021-03-18 DIAGNOSIS — R918 Other nonspecific abnormal finding of lung field: Secondary | ICD-10-CM

## 2021-03-18 DIAGNOSIS — R768 Other specified abnormal immunological findings in serum: Secondary | ICD-10-CM

## 2021-03-18 LAB — LIPID PANEL
Cholesterol: 209 mg/dL (ref 0–239)
HIGH DENSITY LIPOPROTEIN: 36 mg/dL — ABNORMAL LOW (ref 40–60)
LOW DENSITY LIPOPROTEIN DIRECT: 158 mg/dL (ref 0–189)
TRIGLYCERIDES: 107 mg/dL (ref 0–150)

## 2021-03-18 LAB — COMPREHENSIVE METABOLIC PANEL
ALANINE AMINOTRANSFERASE: 13 U/L (ref 12–45)
ALBUMIN: 4.4 g/dL (ref 3.4–5.2)
ALKALINE PHOSPHATASE: 59 U/L (ref 45–117)
ANION GAP: 12 mmol/L (ref 10–22)
ASPARTATE AMINOTRANSFERASE: 20 U/L (ref 8–34)
BILIRUBIN TOTAL: 0.7 mg/dL (ref 0.2–1.0)
BUN (UREA NITROGEN): 13 mg/dL (ref 7–18)
CALCIUM: 9.2 mg/dl (ref 8.5–10.1)
CARBON DIOXIDE: 26 mmol/L (ref 21–32)
CHLORIDE: 105 mmol/L (ref 98–107)
CREATININE: 0.8 mg/dL (ref 0.4–1.2)
ESTIMATED GLOMERULAR FILT RATE: >60 mL/min (ref >60)
Glucose Random: 91 mg/dL (ref 74–160)
POTASSIUM: 4.2 mmol/L (ref 3.5–5.1)
SODIUM: 143 mmol/L (ref 136–145)
TOTAL PROTEIN: 6.4 g/dL (ref 6.4–8.2)

## 2021-03-18 LAB — CBC WITH PLATELET
ABSOLUTE NRBC COUNT: 0 10*3/uL (ref 0.0–0.0)
HEMATOCRIT: 42.9 % (ref 34.1–44.9)
HEMOGLOBIN: 14.4 g/dL (ref 11.2–15.7)
MEAN CORP HGB CONC: 33.6 g/dL (ref 31.0–37.0)
MEAN CORPUSCULAR HGB: 28.9 pg (ref 26.0–34.0)
MEAN CORPUSCULAR VOL: 86 fl (ref 80.0–100.0)
MEAN PLATELET VOLUME: 11.5 fL (ref 8.7–12.5)
NRBC %: 0 % (ref 0.0–0.0)
PLATELET COUNT: 156 10*3/uL (ref 150–400)
RBC DISTRIBUTION WIDTH STD DEV: 39.8 fL (ref 35.1–46.3)
RED BLOOD CELL COUNT: 4.99 M/uL (ref 3.90–5.20)
WHITE BLOOD CELL COUNT: 5.6 10*3/uL (ref 4.0–11.0)

## 2021-03-18 NOTE — Telephone Encounter (Signed)
Called pt to discuss imaging    Liver on [place] with cysts and hypoechoic but no concerning lesions  Next step pending  Hep B PCR    Lung CT with enlarging L lower lung nodule    IMPRESSION  Slight increase in size of the larger groundglass nodule in the left lower lobe. Tissue sampling should be considered.     LUNG-RADS category: 4A - Suspicious       RECOMMENDATIONS: Biopsy - Follow up with PET/CT and/or tissue sampling in high probability of malignancy and comorbidities based on ACR Lung-RADS Version 1.1. . Lung Nodule Clinic consultation can be considered.        Referral placed to thoracic surgery

## 2021-03-22 NOTE — Progress Notes (Signed)
Pt informed by phone. See telephone encounter on 5/27

## 2021-03-23 ENCOUNTER — Ambulatory Visit (HOSPITAL_BASED_OUTPATIENT_CLINIC_OR_DEPARTMENT_OTHER): Payer: No Typology Code available for payment source

## 2021-03-23 ENCOUNTER — Other Ambulatory Visit: Payer: Self-pay | Admitting: Family Medicine

## 2021-03-25 ENCOUNTER — Encounter (HOSPITAL_BASED_OUTPATIENT_CLINIC_OR_DEPARTMENT_OTHER): Payer: Self-pay | Admitting: Family Medicine

## 2021-03-25 LAB — HEPATITIS B PCR QUANT: HEPATITIS B PCR IU/ML: 0 IU/ML

## 2021-04-05 ENCOUNTER — Encounter (HOSPITAL_BASED_OUTPATIENT_CLINIC_OR_DEPARTMENT_OTHER): Payer: Self-pay | Admitting: Family Medicine

## 2021-04-14 ENCOUNTER — Encounter (HOSPITAL_BASED_OUTPATIENT_CLINIC_OR_DEPARTMENT_OTHER): Payer: Self-pay | Admitting: Family Medicine

## 2021-04-14 DIAGNOSIS — J310 Chronic rhinitis: Secondary | ICD-10-CM

## 2021-04-14 MED ORDER — DICLOFENAC SODIUM 1 % EX GEL
2.0000 g | Freq: Four times a day (QID) | CUTANEOUS | 3 refills | Status: DC | PRN
Start: 2021-04-14 — End: 2021-04-28

## 2021-04-14 NOTE — Telephone Encounter (Signed)
PER Patient (self), Victoria Macdonald is a 75 year old female has requested a refill of      -  diclofenac       Last Office Visit: 5,11,22 with pcp  Last Physical Exam: 12,6,18     COLONOSCOPY due on 02/27/2020     Other Med Adult:  Most Recent BP Reading(s)  03/02/21 : 118/72        Cholesterol (mg/dL)   Date Value   03/18/2021 209     LOW DENSITY LIPOPROTEIN DIRECT (mg/dL)   Date Value   03/18/2021 158     HIGH DENSITY LIPOPROTEIN (mg/dL)   Date Value   03/18/2021 36 (L)     TRIGLYCERIDES (mg/dL)   Date Value   03/18/2021 107         THYROID SCREEN TSH REFLEX FT4 (uIU/mL)   Date Value   02/12/2020 4.110 (H)         No results found for: TSH    No results found for: HGBA1C    No results found for: POCA1C      INR (no units)   Date Value   04/30/2018 1.0       SODIUM (mmol/L)   Date Value   03/18/2021 143       POTASSIUM (mmol/L)   Date Value   03/18/2021 4.2           CREATININE (mg/dL)   Date Value   03/18/2021 0.8        Documented patient preferred pharmacies:    [id number] [address], [place] - [place]  Phone: [phone] Fax: [phone]    [facility] [place], [place] - [address]  Phone: [phone] Fax: [phone]

## 2021-04-15 ENCOUNTER — Telehealth (HOSPITAL_BASED_OUTPATIENT_CLINIC_OR_DEPARTMENT_OTHER): Payer: Self-pay | Admitting: Internal Medicine

## 2021-04-15 ENCOUNTER — Encounter (HOSPITAL_BASED_OUTPATIENT_CLINIC_OR_DEPARTMENT_OTHER): Payer: Self-pay | Admitting: Internal Medicine

## 2021-04-15 ENCOUNTER — Telehealth (HOSPITAL_BASED_OUTPATIENT_CLINIC_OR_DEPARTMENT_OTHER): Payer: Self-pay | Admitting: Family Medicine

## 2021-04-15 NOTE — Telephone Encounter (Signed)
Patient has [facility] for prescription coverage.  I.D.# [id number]  BIN.# [id number]  PCN.# [id number]  GROUP.# [id number]      Prior authorization for Diclofenac 1 % was done electronically using [employer]. Waiting for the insurance company to send [place] a decision for an approval/denial notice. It may take between 24 - 72 hours.    PA KEY#: [id number]

## 2021-04-15 NOTE — Telephone Encounter (Signed)
It is only covered for the diagnosis of actinic keratosis, I explained that to the patient but stated she still wanted the script sent.

## 2021-04-15 NOTE — Telephone Encounter (Signed)
Hello,    Patient is requesting new prescription for diclofenac 3 % Gel     This medication id not on patient med list    Please review and send new rx if its appropriate

## 2021-04-15 NOTE — Telephone Encounter (Signed)
Patient calling requesting script of diclofenac gel 3%, please review and send script if appropriate

## 2021-04-17 MED ORDER — DICLOFENAC SODIUM 3 % EX GEL
2.0000 g | Freq: Four times a day (QID) | CUTANEOUS | 2 refills | Status: DC | PRN
Start: 2021-04-17 — End: 2021-04-28

## 2021-04-18 ENCOUNTER — Ambulatory Visit
Payer: No Typology Code available for payment source | Attending: Thoracic Surgery (Cardiothoracic Vascular Surgery) | Admitting: Thoracic Surgery (Cardiothoracic Vascular Surgery)

## 2021-04-18 ENCOUNTER — Other Ambulatory Visit: Payer: Self-pay

## 2021-04-18 VITALS — BP 124/68 | HR 86

## 2021-04-18 DIAGNOSIS — IMO0001 Reserved for inherently not codable concepts without codable children: Secondary | ICD-10-CM

## 2021-04-18 DIAGNOSIS — F17200 Nicotine dependence, unspecified, uncomplicated: Secondary | ICD-10-CM | POA: Diagnosis present

## 2021-04-18 DIAGNOSIS — R911 Solitary pulmonary nodule: Secondary | ICD-10-CM | POA: Diagnosis present

## 2021-04-18 NOTE — Progress Notes (Signed)
THORACIC SURGERY CLINIC FOLLOW UP    CC: growing mixed density LLL lung nodule    HPI:    68F current smoker with a growing LLL lung nodule, now 2 cm followed since 2017.  She continues to smoke about 5 cigarettes per day. She denies SOB or hemoptysis.      Objective:  Repeat Blood Pressure:  BP Pulse Site Cuff Size Time Date   124/68 86 --- ---  2:05 PM 04/18/2021   No orthostatic vitals data filed.  No peak flow data filed.  No pain information filed.    Exam:  Appears well  Lungs clear  RRR    New Imaging:    03/18/21 CT lung screening (prior comparisons 05/2019, 05/2017, 04/2016)    IMPRESSION:     Slight increase in size of the larger groundglass nodule in the left lower lobe. Tissue sampling should be considered.     LUNG-RADS category: 4A - Suspicious       RECOMMENDATIONS: Biopsy - Follow up with PET/CT and/or tissue sampling in high probability of malignancy and comorbidities based on ACR Lung-RADS Version 1.1. . Lung Nodule Clinic consultation can be considered.       [facility] is an ACR accredited site for LDCT screening program.         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    PFTs   PET at [facility]  CT guided biopsy LLL lung nodule  Follow up visit in 1 month      Valinda Party, MD  Thoracic Surgery

## 2021-04-18 NOTE — Patient Instructions (Signed)
My [place] office will reach out to you directly to schedule a PET scan.  The pulmonary function lab will call you directly to schedule PFTs.  The radiology department will call you directly to schedule a CT guided needle biopsy of the growing left lower lobe lung nodule.   I would like to see you back in person after all testing is completed, tentatively in 1 month.

## 2021-04-19 ENCOUNTER — Encounter (HOSPITAL_BASED_OUTPATIENT_CLINIC_OR_DEPARTMENT_OTHER): Payer: Self-pay | Admitting: Internal Medicine

## 2021-04-19 NOTE — Telephone Encounter (Signed)
I am covering for Dr. Lorane Gell.    I could not find this strength of Voltaren as an order on epic.  I will let Dr. Lorane Gell review upon her return tomorrow.

## 2021-04-21 ENCOUNTER — Telehealth (HOSPITAL_BASED_OUTPATIENT_CLINIC_OR_DEPARTMENT_OTHER): Payer: Self-pay | Admitting: Registered Nurse

## 2021-04-21 NOTE — Telephone Encounter (Signed)
Urgent/External Provider Requesting Office Visit Notes For Patient  Received: Today  [place]  Phone Number: [phone]     Victoria Macdonald [phone], 75 year old, female     Calls today: Clinical Questions ([place])    Name of person calling Katrina of [facility]     Specific nature of request URGENT/ Katrina od [facility] is requesting pt's most recent office visit notes from pcp, and also requesting office visit notes from Dr. Redmond Pulling of Surgery. Katrina stated that she cannot access the notes via the [employer]. Please fax Notes to fax number [phone], Thank You.     Return phone number [phone]   Person calling on behalf of patient: Katrina of [facility]     __________________________________________________    Virgel Gess

## 2021-04-25 NOTE — Telephone Encounter (Signed)
Hello,    It does not look like patient meets PA criteria for this medication;      - Does the patient have osteoarthritis pain in joints susceptible to topical treatment such as feet, ankles, knees, hands, wrists, or elbows?         Please review and let central refill know how you would like to proceed.    Thanks!

## 2021-04-26 NOTE — Telephone Encounter (Signed)
Patient has [facility] for prescription coverage.  I.D.#: [id number]    Filled out [facility] Prior Authorization Request form.    Form does not require provider signature - already faxed form to insurance company for review.    Scanned completed form and fax confirmation into Epic under Media Manager (PA for Diclofenac gel).

## 2021-04-27 ENCOUNTER — Ambulatory Visit (HOSPITAL_BASED_OUTPATIENT_CLINIC_OR_DEPARTMENT_OTHER): Payer: No Typology Code available for payment source

## 2021-04-27 ENCOUNTER — Ambulatory Visit
Admission: RE | Admit: 2021-04-27 | Discharge: 2021-04-27 | Disposition: A | Discharge status: Home / Self Care | Payer: No Typology Code available for payment source | Attending: Thoracic Surgery (Cardiothoracic Vascular Surgery) | Admitting: Thoracic Surgery (Cardiothoracic Vascular Surgery)

## 2021-04-27 ENCOUNTER — Other Ambulatory Visit: Payer: Self-pay

## 2021-04-27 VITALS — HR 104

## 2021-04-27 DIAGNOSIS — IMO0001 Reserved for inherently not codable concepts without codable children: Secondary | ICD-10-CM

## 2021-04-27 DIAGNOSIS — R911 Solitary pulmonary nodule: Secondary | ICD-10-CM | POA: Insufficient documentation

## 2021-04-27 DIAGNOSIS — R768 Other specified abnormal immunological findings in serum: Secondary | ICD-10-CM

## 2021-04-27 DIAGNOSIS — R7689 Other specified abnormal immunological findings in serum: Secondary | ICD-10-CM

## 2021-04-27 DIAGNOSIS — Z13220 Encounter for screening for lipoid disorders: Secondary | ICD-10-CM

## 2021-04-27 LAB — PROTHROMBIN TIME
INR: 1 (ref 2.0–3.5)
PROTHROMBIN TIME: 11.6 SECONDS (ref 9.6–12.3)

## 2021-04-27 LAB — CBC WITH PLATELET
ABSOLUTE NRBC COUNT: 0 10*3/uL (ref 0.0–0.0)
HEMATOCRIT: 42 % (ref 34.1–44.9)
HEMOGLOBIN: 14.2 g/dL (ref 11.2–15.7)
MEAN CORP HGB CONC: 33.8 g/dL (ref 31.0–37.0)
MEAN CORPUSCULAR HGB: 28.9 pg (ref 26.0–34.0)
MEAN CORPUSCULAR VOL: 85.5 fl (ref 80.0–100.0)
MEAN PLATELET VOLUME: 11.6 fL (ref 8.7–12.5)
NRBC %: 0 % (ref 0.0–0.0)
PLATELET COUNT: 161 10*3/uL (ref 150–400)
RBC DISTRIBUTION WIDTH STD DEV: 40.6 fL (ref 35.1–46.3)
RED BLOOD CELL COUNT: 4.91 M/uL (ref 3.90–5.20)
WHITE BLOOD CELL COUNT: 5.6 10*3/uL (ref 4.0–11.0)

## 2021-04-27 LAB — APTT: APTT: <26.7 SECONDS — ABNORMAL LOW (ref 26.9–38.0)

## 2021-04-27 MED ORDER — ALBUTEROL SULFATE HFA 108 (90 BASE) MCG/ACT IN AERS
4.0000 | INHALATION_SPRAY | Freq: Once | RESPIRATORY_TRACT | Status: AC | PRN
Start: 2021-04-27 — End: 2021-04-27
  Administered 2021-04-27: 4 via RESPIRATORY_TRACT

## 2021-04-28 ENCOUNTER — Telehealth (HOSPITAL_BASED_OUTPATIENT_CLINIC_OR_DEPARTMENT_OTHER): Payer: Self-pay

## 2021-04-28 ENCOUNTER — Other Ambulatory Visit (HOSPITAL_BASED_OUTPATIENT_CLINIC_OR_DEPARTMENT_OTHER): Payer: Self-pay | Admitting: Internal Medicine

## 2021-04-28 MED ORDER — DICLOFENAC SODIUM 3 % EX GEL
2.0000 g | Freq: Four times a day (QID) | CUTANEOUS | 3 refills | Status: DC | PRN
Start: 2021-04-28 — End: 2022-09-12

## 2021-04-28 NOTE — Nursing Note (Signed)
Called pt to book biopsy appointment.  Pt states she received a letter that the biopsy was at [facility] on 7/18.  She has already booked a ride and someone to accompany her.  I offered her multiple dates to have biopsy.  She was contemplating and then stated she will leave as is.   Order cancelled and Dr Redmond Pulling ordering provider notified by staffnet

## 2021-04-29 DIAGNOSIS — R911 Solitary pulmonary nodule: Secondary | ICD-10-CM | POA: Diagnosis not present

## 2021-04-29 NOTE — Procedures (Signed)
SUMMARY:  Spirometry data quality is acceptable and reproducible.  There is no obstructive ventilatory deficit by ATS criteria.  There is no significant response to bronchodilator. This should not preclude the use   of bronchodilators if patient derives clinical benefit from thier use.  Lung volumes as measured by body plethysmography demonstrate no restrictive   ventilatory deficit.  DLCO, uncorrected for hemoglobin, is normal indicating preserved gas exchange.  Patient walked 591ft. Starting SpO2 was 99% and decreased to 98% with ambulation, an   abnormal response.  IMPRESSION: The compilation of these findings is most consistent with normal PFTs.   Mild oxygen desaturation with ambulation that would not benefit from supplemental   oxygen.

## 2021-05-04 ENCOUNTER — Other Ambulatory Visit (HOSPITAL_BASED_OUTPATIENT_CLINIC_OR_DEPARTMENT_OTHER): Payer: Self-pay

## 2021-05-04 ENCOUNTER — Telehealth (HOSPITAL_BASED_OUTPATIENT_CLINIC_OR_DEPARTMENT_OTHER): Payer: Self-pay

## 2021-05-04 DIAGNOSIS — R918 Other nonspecific abnormal finding of lung field: Secondary | ICD-10-CM

## 2021-05-04 NOTE — Nursing Note (Signed)
Pt called to inform the Interventional department that the biopsy she thought was booked at [facility] was a CT scan.  I told her we could book the biopsy at [facility]. After a few phone calls pt cancelled the CT scan at [facility] which upon intense questioning was actually a PET scan.  Lung Biopsy booked for 7/22 0900 arrival.  ..patient Educated regarding lung biopsy  procedure. Procedure date and time reviewed. 05/13/21 0900 arrival  Pre-Procedure instructions given as follows:    Instructed that a responsible party must provide pt. With transportation day of procedure, due to sedation that is given.  Pt will come by cab but a friend will come to hospital to drive her home post procedure.   Instructed no eating or drinking after Midnight the night prior to procedure. Reviewed Medications and instructed to take morning medications with small sip of water.Pt does not take any medications. Instructed to contact primary care with any medication concerns.    Pt advised that they will need to stop anti-coagulants one week prior to procedure. Told to contact provider managing therapy. No anticoagulants.   Pt. Told not to take any over the counter medication containing aspirin one week prior to procedure.   patient verbalized understanding and had no further questions at this time.    Pt lives in [place] so does not want to come to [place] for a covid test will  Have test pcr at urgent care with a printed result and bring result to hospital.    Pt informed to call back BI to rebook PET scan.  Call Dr Redmond Pulling office to change follow up appointment since no results will be available on July 25.   I staff messaged Dr Redmond Pulling to inform her of above.  Pt has the Interventional Radiology phone number in case of any questions or concerns,

## 2021-05-04 NOTE — Progress Notes (Signed)
No change in results from May labs

## 2021-05-05 LAB — LIPID PANEL
Cholesterol: 207 mg/dL (ref 0–239)
HIGH DENSITY LIPOPROTEIN: 38 mg/dL — ABNORMAL LOW (ref 40–60)
LOW DENSITY LIPOPROTEIN DIRECT: 157 mg/dL (ref 0–189)
TRIGLYCERIDES: 99 mg/dL (ref 0–150)

## 2021-05-05 LAB — COMPREHENSIVE METABOLIC PANEL
ALANINE AMINOTRANSFERASE: 12 U/L (ref 12–45)
ALBUMIN: 4.4 g/dL (ref 3.4–5.2)
ALKALINE PHOSPHATASE: 61 U/L (ref 45–117)
ANION GAP: 12 mmol/L (ref 10–22)
ASPARTATE AMINOTRANSFERASE: 18 U/L (ref 8–34)
BILIRUBIN TOTAL: 0.7 mg/dL (ref 0.2–1.0)
BUN (UREA NITROGEN): 15 mg/dL (ref 7–18)
CALCIUM: 9.2 mg/dl (ref 8.5–10.1)
CARBON DIOXIDE: 24 mmol/L (ref 21–32)
CHLORIDE: 105 mmol/L (ref 98–107)
CREATININE: 0.8 mg/dL (ref 0.4–1.2)
ESTIMATED GLOMERULAR FILT RATE: >60 mL/min (ref >60)
Glucose Random: 91 mg/dL (ref 74–160)
POTASSIUM: 4.6 mmol/L (ref 3.5–5.1)
SODIUM: 141 mmol/L (ref 136–145)
TOTAL PROTEIN: 6.6 g/dL (ref 6.4–8.2)

## 2021-05-05 LAB — HEPATITIS B PCR QUANT: HEPATITIS B PCR IU/ML: 0 IU/ML

## 2021-05-05 NOTE — Progress Notes (Signed)
Non-urgent, leave for ordering for provider.

## 2021-05-05 NOTE — Progress Notes (Signed)
Non-urgent, leave for ordering provider.

## 2021-05-06 ENCOUNTER — Other Ambulatory Visit (HOSPITAL_BASED_OUTPATIENT_CLINIC_OR_DEPARTMENT_OTHER): Payer: Self-pay

## 2021-05-10 ENCOUNTER — Telehealth (HOSPITAL_BASED_OUTPATIENT_CLINIC_OR_DEPARTMENT_OTHER): Payer: Self-pay

## 2021-05-10 NOTE — Progress Notes (Signed)
Pt informed of results by MyChart

## 2021-05-10 NOTE — Nursing Note (Signed)
Called pt to remind her to bring Covid testing results with her on 05/13/21 Pt had requested to have covid testing down outside [place] facility.  Pt also reminded to remain NPO after midnight on Thursday.  Information left on message machine.

## 2021-05-13 ENCOUNTER — Ambulatory Visit (HOSPITAL_BASED_OUTPATIENT_CLINIC_OR_DEPARTMENT_OTHER)
Admit: 2021-05-13 | Discharge: 2021-05-13 | Disposition: A | Discharge status: Home / Self Care | Payer: No Typology Code available for payment source

## 2021-05-13 ENCOUNTER — Ambulatory Visit
Admission: RE | Admit: 2021-05-13 | Discharge: 2021-05-13 | Disposition: A | Discharge status: Home / Self Care | Payer: No Typology Code available for payment source | Attending: Thoracic Surgery (Cardiothoracic Vascular Surgery) | Admitting: Thoracic Surgery (Cardiothoracic Vascular Surgery)

## 2021-05-13 ENCOUNTER — Encounter (HOSPITAL_BASED_OUTPATIENT_CLINIC_OR_DEPARTMENT_OTHER): Payer: Self-pay

## 2021-05-13 ENCOUNTER — Other Ambulatory Visit: Payer: Self-pay

## 2021-05-13 DIAGNOSIS — R918 Other nonspecific abnormal finding of lung field: Secondary | ICD-10-CM | POA: Diagnosis not present

## 2021-05-13 DIAGNOSIS — R911 Solitary pulmonary nodule: Secondary | ICD-10-CM | POA: Insufficient documentation

## 2021-05-13 DIAGNOSIS — Z48813 Encounter for surgical aftercare following surgery on the respiratory system: Secondary | ICD-10-CM | POA: Insufficient documentation

## 2021-05-13 MED ORDER — LIDOCAINE HCL (PF) 1 % IJ SOLN
INTRAMUSCULAR | Status: AC
Start: 2021-05-13 — End: 2021-05-13
  Administered 2021-05-13: 10 mL via SUBCUTANEOUS
  Filled 2021-05-13: qty 10

## 2021-05-13 MED ORDER — FENTANYL CITRATE 0.05 MG/ML IJ SOLN
INTRAMUSCULAR | Status: AC
Start: 2021-05-13 — End: 2021-05-13
  Filled 2021-05-13: qty 2

## 2021-05-13 MED ORDER — SODIUM CHLORIDE 0.9 % IV SOLN
10.0000 mL/h | INTRAVENOUS | Status: DC
Start: 2021-05-13 — End: 2021-05-14
  Administered 2021-05-13: 10 mL/h via INTRAVENOUS

## 2021-05-13 MED ORDER — MIDAZOLAM HCL 2 MG/2 ML IJ SOLN
INTRAMUSCULAR | Status: AC
Start: 2021-05-13 — End: 2021-05-13
  Filled 2021-05-13: qty 2

## 2021-05-13 MED ORDER — MIDAZOLAM HCL 2 MG/2 ML IJ SOLN
INTRAMUSCULAR | Status: AC | PRN
Start: 2021-05-13 — End: 2021-05-13
  Administered 2021-05-13: .5 mg via INTRAVENOUS
  Administered 2021-05-13: 1 mg via INTRAVENOUS

## 2021-05-13 MED ORDER — FENTANYL CITRATE 0.05 MG/ML IJ SOLN
INTRAMUSCULAR | Status: AC | PRN
Start: 2021-05-13 — End: 2021-05-13
  Administered 2021-05-13: 25 ug via INTRAVENOUS
  Administered 2021-05-13: 50 ug via INTRAVENOUS

## 2021-05-13 NOTE — Pre-Procedure (Signed)
PRE-PROCEDURE NOTE      Patients H&P Reviewed :Yes    Any Changes to the H&P :No    ASA Classification : II   A Patient With Mild Systemic Disease    Airway Classification : Class II   The same as Class I except the tonsilar pillars are hidden by the tongue.    Anesthesiology Consulted : No    Signe Colt, MD, 05/13/2021      Pager: [phone]

## 2021-05-13 NOTE — Sedation/code quick notes (Signed)
hemoptysis noted. Pt spo2 92 % on 4 l nc. CT performed , no pneumothorax noted. Pt transferred to stretcher in upright position. Given Yankauer suction for hemoptysis. Pt awake, alert.

## 2021-05-13 NOTE — Sedation/code quick notes (Signed)
Pt ambulatory to procedure room. Hard copy of COVID pcr negative result provided and scanned into chart.

## 2021-05-13 NOTE — Discharge Instructions (Addendum)
Dr Jamas Lav says that you may resume all of your pre procedure medications.      No driving today, no alcohol today, do not make any important decisions today.  You may return to your normal diet today and you may return to your normal activities tomorrow.  Please call or go to the ER for any fever (T=101 or higher), chills, nausea or vomiting or inability to eat, any chest pain, or shortness of breath.    You may remove the bandaid from your L back on Sunday morning.

## 2021-05-13 NOTE — Narrator Note (Signed)
Moderate Sedation    Event Log     Event Log: Today (05/13/2021) 09:06 to 10:23     Time Event Details User    09:06:04 Orders Placed NURSING  - Vital signs per Moderate Sedation Policy  Medications  - sodium chloride 0.9% infusion  IV  - Insert peripheral IV  Diet  - Diet NPO -Effective Now -EXCEPT FOR-NO EXCEPTIONS   Signe Colt, MD    56:21:30 Start of IR documentation     Royston Bake, RN    09:07:04 Orders Placed Pathology  - Surgical Path Specimen Lung   Leonor Liv, MD    [phone] Staff Arrived Royston Bake, RN [Rad Nurse]; Lilli Light [facility] Tech]; Signe Colt, MD [Radiologist]; Katlyn Powers [Rad Tech]; Georges Lynch Regalado [Rad Tech]   Royston Bake, RN    [phone] Assessment Pre-Procedure Checklist  CONSENT, complete and present?:  Yes  Interpreter Needed?:  No  Identification band on:  Yes  Does the patient have a Pacer or Defibrillator?:  No  N.P.O after midnight:  Yes  Transport Method:  Ambulatory  Any/All Allergies Doc. Including Latex/Skin Prep:  Yes  Fall Risk/DV   Fall Assessment:  Safe  * Do you feel safe at home?:  Yes   Pre / Intra / Post-Procedure Vital Signs  Patient Position:  Lying  BP:  163/73  Pulse:  88  Resp:  18  Temp:  98 F (36.7 C)  SpO2:  93 %  Neurological  Neuro (WDL):  Within Defined Limits  Pre / Post-Procedure Pain  Pain Level:  0  OB/GYN Status  Are/were your periods regular?:  No  Blood Thinners  Is the patient on Blood Thinners?:  No  Pre-Sedation Confirmation  Emergency Equipment:  Emergency cart; Airway - pediatric; Pulse oximeter; Defibrillator; Age appropriate AMBU bag; Emergency medications (including reversal agents); Cardiac Monitor; Intubation tray; Other (Comment); ETCO2; Suction; Airway - adult; Oxygen set up  Patient's ASA status is identified?:  Yes   Royston Bake, RN    [phone] Devices Testing Template Device Data  MAP (mmHg):  [place], RN    09:11:00 Medication Given in Moderate Sedation Narrator midazolam (VERSED) 2 mg/2 ml injection  -  Scheduled Time:  0911   Royston Bake, RN    09:11:00 Medication Given in Moderate Sedation Narrator fentaNYL (SUBLIMAZE) 0.05 MG/ML injection -  Scheduled Time:  4401   Royston Bake, RN    09:11:35 IR Anesthesia Info Anesthesia Information  Anesthesia Type  (Non MU Types):  None   Royston Bake, RN    09:13:19 Respiratory Assessment Respiratory (WDL)  Respiratory (WDL):  Breath sounds clear/equal, normal chest rise/effort  (NO RESPIRATORY COMPLAINT)   Royston Bake, RN    [phone] Cardiac Assessment Cardiac  Cardiac (WDL):    (NO CARDIAC COMPLAINT)   Royston Bake, RN    09:14:00 Peripheral IV 05/13/21 Anterior;Left;Proximal Placed Placement Date/Time: 05/13/21 0914   Facility where inserted: [place]  Inserted by: JF  Insertion attempts: 1  Size Gauge: 22 G  Orientation: Anterior;Left;Proximal  Location: Mid forearm  Site Prep: Alcohol   Royston Bake, RN    09:14:00 Medication New Bag sodium chloride 0.9% infusion -  Dose:  10 mL/hr ; Rate:  10 mL/hr ; Route:  Intravenous ; Line:  Peripheral IV 05/13/21 Anterior;Left;Proximal ; Scheduled Time:  0930   Royston Bake, RN    09:14:00 Devices Testing Template Device Data  Pulse:  88  (Device Time: 09:14:07)  Resp:  14  (  Device Time: 09:14:07)  SpO2:  96 %  (Device Time: 09:14:07)   Royston Bake, RN    09:14:16 Sedation/code quick notes Patient education provided for Procedural Sedation, Adult  Medicine(s) were given today to help you feel comfortable and relaxed during tests and/or treatments. Like any medicines, these can cause unwanted but temporary effects.   HOME CARE INSTRUCTIONS   Warning: The medicine(s) given today may cause temporary drowsiness, clumsiness or poor balance. Poor judgment can also result before the medicine(s) wear off. Someone should meet you and drive you home. Do not drive, operate machinery, use power or hand tools for the next 12 hours.    Do not make important decisions until improved. Avoid exercising, bicycling, swimming, climbing  ladders, or working at heights for the remainder of the day.    You may temporarily feel sick, weak, or dizzy. This is expected. Vomiting may occur if you eat too soon. When you can drink without vomiting, try water, juice, or soup. Try solid foods if you feel little or no nausea and continue to feel better.    If pain killers have been prescribed for you, ask your caregiver how soon it is safe to take them.    You should not drink alcohol, take sleeping pills, or medicines that cause drowsiness for at least 24 hours.    If you smoke, do not smoke alone.    Only take over-the-counter or prescription medicines for pain, discomfort, or fever as directed by your caregiver.    If you are feeling better, you may resume normal activities 24 hours after receiving sedation.   SEEK MEDICAL CARE IF:   Breathing problems develop.    Your skin is pale or bluish in color.    Pain is getting worse and not helped by medication.    There is bleeding or swelling.    A temperature over 101 F (38.3 C) develops.    You are still sleepy or feeling clumsy after 24 hours.   Document Released: 10/25/2006 Document Re-Released: 08/06/2009  [facility] Patient Information 2012 [place].     Royston Bake, RN    [phone] Sedation/code quick notes Pt ambulatory to procedure room. Hard copy of COVID pcr negative result provided and scanned into chart.      Royston Bake, RN    [phone] Pain Assessment Pain Assessment  Can the patient self report pain?:  Yes  Pain Score:  0 (0/10)   Royston Bake, RN    [phone] Pain Reassessment    Royston Bake, RN    09:30:00 In Room Status: Procedure   --    09:32:00 Timeout: General Radiology Verified by Royston Bake, RN at 05/13/2021 [place], RN    09:32:00 Devices Testing Template  Device Data  Pulse:  96  (Device Time: 09:32:05)  Resp:  17  (Device Time: 09:32:05)  SpO2:  99 %  (Device Time: 09:32:05)  ETCO2 (mmHg):  29 mmHg  (Device Time: 09:32:05)  BP:  183/93  (Device  Time: 09:32:17)  MAP (mmHg):  [place], RN    09:35:00 [place] Mod Sedation [place] Details  [place] Behavioral Pain Scale (for sedated patients):  0 - Restful.  No facial expression  Sedation Scale (Modified [place]):  5 - Alert  Motor Strength ( for sedation scale):  3 - Strong, complete movement  Nursing interventions :  Medication increased   Royston Bake, RN    09:36:00 Devices Testing Template  Device  Data  Pulse:  94  (Device Time: 09:36:05)  Resp:  15  (Device Time: 09:36:05)  SpO2:  98 %  (Device Time: 09:36:05)  ETCO2 (mmHg):  35 mmHg  (Device Time: 09:36:05)  BP:  178/82  (Device Time: 09:35:31)  MAP (mmHg):  [address] [place]    09:36:24 Medication Ordered and Given midazolam (VERSED) injection -  Dose:  1 mg ; Route:  Intravenous ; Line:  Peripheral IV 05/13/21 Anterior;Left;Proximal Ordered by: Signe Colt, MD   Royston Bake, RN    09:36:56 Sedation Start    Royston Bake, RN    09:36:57 Pain Med Given Intravenous Given - fentaNYL (SUBLIMAZE) injection   Royston Bake, RN    09:36:57 Medication Ordered and Given fentaNYL (SUBLIMAZE) injection -  Dose:  50 mcg ; Route:  Intravenous ; Line:  Peripheral IV 05/13/21 Anterior;Left;Proximal Ordered by: Signe Colt, MD   Royston Bake, RN    09:40:00 [place]/Ram[place] Mod Sedation [place] Details  [place] Behavioral Pain Scale (for sedated patients):  0 - Restful.  No facial expression  Sedation Scale (Modified Ramsey):  4 - Drowsy  Motor Strength ( for sedation scale):  2 - Moderate movement & strength  Nursing interventions :  Nursing intervention not needed at this time   Royston Bake, RN    09:41:00 Devices Testing Template  Device Data  Pulse:  89  (Device Time: 09:41:06)  Resp:  11  (Device Time: 09:41:06)  SpO2:  97 %  (Device Time: 09:41:06)  ETCO2 (mmHg):  37 mmHg  (Device Time: 09:41:06)  BP:  153/70  (Device Time: 09:40:38)  MAP (mmHg):  97.67   Royston Bake, RN    09:45:44  [place]/Ram[place] Mod Sedation [place] Behavioral Pain Scale (for sedated patients):  0 - Restful.  No facial expression  Sedation Scale (Modified Ramsey):  4 - Drowsy  Motor Strength ( for sedation scale):  2 - Moderate movement & strength  Nursing interventions :  Medication increased   Royston Bake, RN    09:46:00 Devices Testing Template  Device Data  Pulse:  91  (Device Time: 09:46:07)  Resp:  16  (Device Time: 09:46:07)  SpO2:  97 %  (Device Time: 09:46:07)  ETCO2 (mmHg):  39 mmHg  (Device Time: 09:46:07)  BP:  162/77  (Device Time: 09:45:30)  MAP (mmHg):  105.33   Royston Bake, RN    09:47:03 Medication Given midazolam (VERSED) injection -  Dose:  0.5 mg ; Route:  Intravenous ; Line:  Peripheral IV 05/13/21 Anterior;Left;Proximal   Royston Bake, RN    09:47:20 Pain Med Given Intravenous Given - fentaNYL (SUBLIMAZE) injection   Royston Bake, RN    09:47:20 Medication Given fentaNYL (SUBLIMAZE) injection -  Dose:  25 mcg ; Route:  Intravenous ; Line:  Peripheral IV 05/13/21 Anterior;Left;Proximal   Royston Bake, RN    09:48:38 Order Performed CT Biopsy Lung Left  -  ID:  [id number]       09:50:00 [place] Mod Sedation [place] Behavioral Pain Scale (for sedated patients):  0 - Restful.  No facial expression  Sedation Scale (Modified Ramsey):  4 - Drowsy  Motor Strength ( for sedation scale):  2 - Moderate movement & strength  Nursing interventions :  Nursing intervention not needed at this time   Royston Bake, RN    09:51:00 Devices Testing Template  Device Data  Pulse:  90  (Device Time: 09:51:05)  Resp:  14  (Device Time: 09:51:05)  SpO2:  96 %  (Device Time: 09:51:05)  ETCO2 (mmHg):  37 mmHg  (Device Time: 09:51:05)  BP:  159/77  (Device Time: 09:50:31)  MAP (mmHg):  104.33   Royston Bake, RN    09:55:15 [place]/Ram[place] Mod Sedation [place] Behavioral Pain Scale (for sedated patients):  0 -  Restful.  No facial expression  Sedation Scale (Modified Ramsey):  4 - Drowsy  Motor Strength ( for sedation scale):  2 - Moderate movement & strength  Nursing interventions :  Nursing intervention not needed at this time   Royston Bake, RN    09:56:00 Devices Testing Template  Device Data  Pulse:  89  (Device Time: 09:56:06)  Resp:  14  (Device Time: 09:56:06)  SpO2:  97 %  (Device Time: 09:56:06)  ETCO2 (mmHg):  36 mmHg  (Device Time: 09:56:06)  BP:  151/67  (Device Time: 09:55:35)  MAP (mmHg):  95   Royston Bake, RN    10:00:00 Devices Testing Template  Device Data  Pulse:  88  (Device Time: 10:00:05)  Resp:  13  (Device Time: 10:00:05)  SpO2:  97 %  (Device Time: 10:00:05)  ETCO2 (mmHg):  37 mmHg  (Device Time: 10:00:05)  BP:  155/76  (Device Time: 10:00:27)  MAP (mmHg):  102.33   Royston Bake, RN    10:00:06 [place]/Ram[place] Mod Sedation [place] Behavioral Pain Scale (for sedated patients):  0 - Restful.  No facial expression  Sedation Scale (Modified Ramsey):  4 - Drowsy  Motor Strength ( for sedation scale):  2 - Moderate movement & strength  Nursing interventions :  Nursing intervention not needed at this time   Royston Bake, RN    10:05:15 [place]/Ram[place] Mod Sedation [place] Behavioral Pain Scale (for sedated patients):  0 - Restful.  No facial expression  Sedation Scale (Modified Ramsey):  5 - Alert  Motor Strength ( for sedation scale):  3 - Strong, complete movement  Nursing interventions :  Medication stopped   Royston Bake, RN    10:09:00 Devices Testing Template  Device Data  Pulse:  112  (Device Time: 10:09:06)  SpO2:  86 %  (Device Time: 10:09:06)  BP:  204/95  (Device Time: 10:09:28)  MAP (mmHg):  131.33   Royston Bake, RN    10:10:00 [place] Mod Sedation [place] Behavioral Pain Scale (for sedated patients):  0 - Restful.  No facial expression  Sedation Scale (Modified Ramsey):   5 - Alert  Motor Strength ( for sedation scale):  3 - Strong, complete movement  Nursing interventions :  Nursing intervention not needed at this time   Royston Bake, RN    10:11:00 Devices Testing Template  Device Data  Pulse:  108  (Device Time: 10:11:06)  Resp:  22  (Device Time: 10:11:06)  SpO2:  86 %  (Device Time: 10:11:06)  BP:  204/71  (Device Time: 10:11:09)  MAP (mmHg):  115.33   Royston Bake, RN    10:15:00 Devices Testing Template  Device Data  Pulse:  98  (Device Time: 10:15:06)  Resp:  25  (Device Time: 10:15:06)  SpO2:  92 %  (Device Time: 10:15:06)  BP:  174/71  (Device Time: 10:15:12)  MAP (mmHg):  105.33   Royston Bake, RN    10:15:08 [place]/Ram[place] Mod Sedation [place] Behavioral Pain Scale (  for sedated patients):  0 - Restful.  No facial expression  Sedation Scale (Modified Ramsey):  5 - Alert  Motor Strength ( for sedation scale):  3 - Strong, complete movement  Nursing interventions :  Nursing intervention not needed at this time   Royston Bake, RN    10:17:00 Medication Given lidocaine (PF) 1 % injection -  Dose:  10 mL ; Route:  Subcutaneous ; Site:  Back ; Scheduled Time:  [place]    10:18:10 Sedation/code quick notes hemoptysis noted. Pt spo2 92 % on 4 l nc. CT performed , no pneumothorax noted. Pt transferred to stretcher in upright position. Given Yankauer suction for hemoptysis. Pt awake, alert.      Royston Bake, RN    10:18:52 Sedation/code quick notes Report called to pacu. Pt prepared for transport.      Royston Bake, RN    10:19:40 Sedation End    Royston Bake, RN    [phone] Post-Procedure Modified [place]  Activity:  Able to move 4 extremities voluntarily or on command  Respiration:  Able to breathe deeply and cough freely  Circulation:  Blood pressure within 20% of pre-anesthetic level  Consciousness:  Fully awake  Oxygen Saturation:  Needs oxygen inhalation to maintain oxygen saturation greater that 90%  Modified [place]  Score:  9  Post-Sedation Care  Procedure Tolerated:  Well  Report Called To:  Tye [place]  Transferred To:  Recovery area  Transport Method:  Stretcher   Royston Bake, RN    10:21:00 Devices Testing Template  Device Data  Pulse:  92  (Device Time: 10:21:05)  Resp:  13  (Device Time: 10:21:05)  SpO2:  93 %  (Device Time: 10:21:05)  BP:  163/75  (Device Time: 10:20:34)  MAP (mmHg):  104.33   Royston Bake, RN    10:21:51 Out of Room Status: Recovery   --    10:21:55 To Pacu Status: Recovery   --    10:23:58 Staff Departed Royston Bake, RN [place] Nurse] (Automatically marked out by End of IR Documentation event); Lilli Light [Rad Tech] (Automatically marked out by End of IR Documentation event); Signe Colt, MD [Radiologist] (Automatically marked out by End of IR Documentation event); Katlyn Powers [Rad Tech] (Automatically marked out by End of IR Documentation event); [place] Main Tech] (Automatically marked out by End of IR Documentation event)   Royston Bake, RN    10:23:58 End of IR Documentation    Royston Bake, RN

## 2021-05-13 NOTE — Brief Op Note (Signed)
Brief Procedure or Operative Note    Procedure: Left lung biopsy  Pre-Procedure Diagnosis:   Lung mass    Post-Procedure Diagnosis: Same      Surgeon: Signe Colt, MD    Assistant:None      Type of Anesthesia:   Moderate Sedation    Findings:   sub-solid, slowly growing LLL lesion    Estimated Blood Loss: Negligible    Specimens Removed:  Other: cores    Complications:Hemoptysis  Other (e.g. Implants):  None        Signe Colt, MD, 05/13/2021        Pager : [phone]

## 2021-05-13 NOTE — Sedation/code quick notes (Signed)
Report called to pacu. Pt prepared for transport.

## 2021-05-13 NOTE — Sedation/code quick notes (Signed)
Patient education provided for Procedural Sedation, Adult  Medicine(s) were given today to help you feel comfortable and relaxed during tests and/or treatments. Like any medicines, these can cause unwanted but temporary effects.   HOME CARE INSTRUCTIONS  · Warning: The medicine(s) given today may cause temporary drowsiness, clumsiness or poor balance. Poor judgment can also result before the medicine(s) wear off. Someone should meet you and drive you home. Do not drive, operate machinery, use power or hand tools for the next 12 hours.   · Do not make important decisions until improved. Avoid exercising, bicycling, swimming, climbing ladders, or working at heights for the remainder of the day.   · You may temporarily feel sick, weak, or dizzy. This is expected. Vomiting may occur if you eat too soon. When you can drink without vomiting, try water, juice, or soup. Try solid foods if you feel little or no nausea and continue to feel better.   · If pain killers have been prescribed for you, ask your caregiver how soon it is safe to take them.   · You should not drink alcohol, take sleeping pills, or medicines that cause drowsiness for at least 24 hours.   · If you smoke, do not smoke alone.   · Only take over-the-counter or prescription medicines for pain, discomfort, or fever as directed by your caregiver.   · If you are feeling better, you may resume normal activities 24 hours after receiving sedation.   SEEK MEDICAL CARE IF:  · Breathing problems develop.   · Your skin is pale or bluish in color.   · Pain is getting worse and not helped by medication.   · There is bleeding or swelling.   · A temperature over 101º F (38.3º C) develops.   · You are still sleepy or feeling clumsy after 24 hours.   Document Released: 10/25/2006 Document Re-Released: 08/06/2009  ExitCare® Patient Information ©2012 [employer].

## 2021-05-16 ENCOUNTER — Ambulatory Visit (HOSPITAL_BASED_OUTPATIENT_CLINIC_OR_DEPARTMENT_OTHER): Payer: Self-pay | Admitting: Thoracic Surgery (Cardiothoracic Vascular Surgery)

## 2021-05-17 LAB — SURGICAL PATH SPECIMEN LUNG

## 2021-05-25 ENCOUNTER — Encounter (HOSPITAL_BASED_OUTPATIENT_CLINIC_OR_DEPARTMENT_OTHER): Payer: Self-pay | Admitting: Thoracic Surgery (Cardiothoracic Vascular Surgery)

## 2021-05-25 ENCOUNTER — Encounter (HOSPITAL_BASED_OUTPATIENT_CLINIC_OR_DEPARTMENT_OTHER): Payer: Self-pay | Admitting: Family Medicine

## 2021-05-26 ENCOUNTER — Telehealth (HOSPITAL_BASED_OUTPATIENT_CLINIC_OR_DEPARTMENT_OTHER): Payer: Self-pay

## 2021-05-26 ENCOUNTER — Other Ambulatory Visit (HOSPITAL_BASED_OUTPATIENT_CLINIC_OR_DEPARTMENT_OTHER): Payer: Self-pay | Admitting: Thoracic Surgery (Cardiothoracic Vascular Surgery)

## 2021-05-26 ENCOUNTER — Encounter (HOSPITAL_BASED_OUTPATIENT_CLINIC_OR_DEPARTMENT_OTHER): Payer: Self-pay | Admitting: Thoracic Surgery (Cardiothoracic Vascular Surgery)

## 2021-05-26 DIAGNOSIS — C3432 Malignant neoplasm of lower lobe, left bronchus or lung: Secondary | ICD-10-CM

## 2021-05-26 NOTE — Telephone Encounter (Addendum)
Returned call to patients son explained unfortunately, I am not able to discuss his mothers medical history without her permission.  If he want to call when he's with her and she gives consent that we can certainly speak with him.     ----- Message from Pryor Curia L sent at 05/26/2021  1:05 PM EDT -----  Regarding: Pt of Dr. Redmond Pulling re info  [facility] [place] [phone], 75 year old, female, Telephone Information:  Home Phone      [phone]  Work Phone      Not on file.  Mobile          [phone]      Patient son - Josph Macho - calls today to get an update on his mother. Pt son worried and hasn't been provided any info. Pt concerned of mothers health and would like to speak to someone who can break it down in accurate terms, Please reach out to pt son #: [phone]

## 2021-05-26 NOTE — Progress Notes (Signed)
I called Victoria Macdonald on the phone to discuss her PFTs, PET, and lung nodule biopsy results which showed early stage lung adenocarcinoma and good pulmonary function. We again reviewed treatment options and she opted to move ahead with a minimally invasive lobectomy.  She requests late August as her [birth year] is [birth year].

## 2021-05-27 NOTE — Telephone Encounter (Signed)
Called [facility]. 8/8 appt does need to be cancelled because Dr. Redmond Pulling is unavailable. They will cancel it in Epic and will call patient to reschedule

## 2021-05-30 ENCOUNTER — Ambulatory Visit (HOSPITAL_BASED_OUTPATIENT_CLINIC_OR_DEPARTMENT_OTHER): Payer: Self-pay | Admitting: Thoracic Surgery (Cardiothoracic Vascular Surgery)

## 2021-06-01 ENCOUNTER — Ambulatory Visit: Payer: No Typology Code available for payment source | Attending: Gastroenterology | Admitting: Gastroenterology

## 2021-06-01 DIAGNOSIS — K5792 Diverticulitis of intestine, part unspecified, without perforation or abscess without bleeding: Secondary | ICD-10-CM | POA: Insufficient documentation

## 2021-06-01 NOTE — Progress Notes (Signed)
Patient Name: Victoria Macdonald  MRN: [id number]  Date: 06/01/21  Care language: English   Translator: No    Reason for referral: Recent ER visit at [facility] (01/26/2021). Diverticulitis on CT, currently being treated with antibiotics.     HPI:  Pt is a 75 year old female with new diagnosis of lung cancer who presents for evaluation of diverticulitis.    Pt with recent diverticulitis on CT at [facility] (01/2021) for which she completed course of antibiotics (cipro/flagyl).    EGD/colonoscopy done at [facility] (06/2020) with Dr. Owens Shark. Colonoscopy w/ diverticulosis,  internal hemorrhoids. EGD done for heartburn w/ small mucosal nodule at GE junction, gastritis and esophagitis. Pathology with positive h.pylori, no evidence of intestinal metaplasia or dysplasia.   Pt completed triple therapy. Repeat stool antigen test negative (07/2020).  Dr. Owens Shark had scheduled pt for repeat EGD with Dr. Marcelene Butte to remove small mucosal nodule at GE junction, which was cancelled.    Pt had recent EGD at [facility] (02/2021). EGD report (in media tab) with normal esophagus and stomach. Unable to view pathology from biopsies. Per patient, she reports the results were "normal"    Pt reports she has been doing well.   Denies abdominal pain, heartburn, N/V, change in bowel habits, diarrhea, constipation, hematochezia, melena, rectal bleeding, weight loss, or change in appetite.   Reports normally has 1-2 formed, brown BMs per day.  She does report some issues with eating cheese and drinking milk. She reports hard stools when she eats cheese, and some esophageal mucous when drinking milk. She has been trying to avoid dairy.     Denies EtOH.   Cigarettes: 5-15 per day  Denies NSAIDS  Denies FH GI malignancies, IBD, celiac.    Past Medical Hx:  Patient Active Problem List:     Right low back pain     Tobacco use disorder     Sessile colonic polyp     Adjustment disorder with depressed mood     Osteoporosis     GAD  (generalized anxiety disorder)     Gastroesophageal reflux disease without esophagitis     Lung nodules     Adhesive capsulitis of left shoulder     Nasal congestion     HCV antibody positive     Hepatitis B surface antigen positive     H. pylori infection     Postmenopausal bleeding     Pseudophakia of both eyes     Astigmatism of both eyes with presbyopia     Dry eye     Squamous blepharitis of upper and lower eyelids of both eyes     Arthralgia     Urinary incontinence, nocturnal enuresis     Diverticulosis of large intestine     Barrett's esophagus     Primary cancer of left lower lobe of lung [facility])      Past Surgical History:  ~1985: ABDOMINAL SURGERY  09/02/27: BELPHAROPTOSIS REPAIR; Bilateral      Comment:  S/p bilateral ELA, lateral direct brow lift and excision               of xanthelasmas with full thickness skin grafts nasal                bridge bilaterally 09/11/2017  03/2017: CATARACT EXTRACTION, BILATERAL; Bilateral      Comment:  s/p cataract extraction with PC IOL OU, at [facility], Dr.   03/2017: PB - PB - AFTER CATARACT LASER SURGERY;  Bilateral      Comment:  s/p yag laser capsulotomy OU at [facility]  ~1975: TUBAL LIGATION    Review of Patient's Allergies indicates:   Lactose intolerance*    Other (See Comments)    Comment:bloating    Social History    Tobacco Use      Smoking status: Current Every Day Smoker        Packs/day: 0.25        Years: 53.00        Pack years: 13.25      Smokeless tobacco: Never Used      Tobacco comment: currently smoking 5/day    Alcohol use: No      Alcohol/week: 0.0 standard drinks    Drug use: No      Current Outpatient Medications   Medication Sig    diclofenac sodium 3 % gel Apply 2 g topically 4 (four) times daily as needed    montelukast (SINGULAIR) 10 MG tablet Take 1 tablet by mouth nightly TAKE 1 TABLET BY MOUTH EVERY DAY AT NIGHT    fexofenadine (ALLEGRA) 180 MG tablet Take 1 tablet by mouth daily    fluticasone (FLONASE) 50 MCG/ACT nasal spray 1 spray by Each  Nostril route daily    diclofenac (VOLTAREN) 1 % GEL Gel APPLY 2 G TOPICALLY 4 (FOUR) TIMES DAILY AS NEEDED    cholecalciferol (HM VITAMIN D3) 1000 UNIT tablet Take 1,000 Units by mouth daily    ergocalciferol (VITAMIN D2) 50000 UNIT capsule Take 1 capsule by mouth once a week  for 8 doses    artificial tears (LUBRIFRESH P.M.) OINT Apply to eye     No current facility-administered medications for this visit.       Review of patient's family history indicates:  Problem: Cancer - Other      Relation: Sister          Age of Onset: (Not Specified)          Comment: Uterus, ovarian, intestinal, liver  Problem: Glaucoma      Relation: Brother          Age of Onset: (Not Specified)  Problem: OTHER      Relation: Sister          Age of Onset: (Not Specified)          Comment: Dementia      Review of Systems:    General: Denies fever, chills, generalized weakness, weight loss  Cardiovascular:  Denies chest pain, palpitations  Respiratory: Denies shortness of breath, cough, wheeze   GI: As above  GU: Denies dysuria, change in frequency, hematuria  Musculoskeletal: Denies joint pain, muscle pain, muscle weakness  Neuro:  Denies headache, weakness, numbness, falls  Derm: Denies rash, pruritus, significant skin changes  Heme/Onc:  Denies problems with bleeding or bruising.     Physical Exam:  Physical exam deferred due to televisit due to the [place] pandemic.   Sounds comfortable on the phone. Speaking in full sentences in NAD.    Pertinent Labs:  Component      Latest Ref Rng & Units 04/27/2021   WHITE BLOOD CELL COUNT      4.0 - 11.0 TH/uL 5.6   RED BLOOD CELL COUNT      3.90 - 5.20 M/uL 4.91   HEMOGLOBIN      11.2 - 15.7 g/dL 14.2   HEMATOCRIT      34.1 - 44.9 % 42.0   MEAN CORPUSCULAR VOL  80.0 - 100.0 fl 85.5   MEAN CORPUSCULAR HGB      26.0 - 34.0 pg 28.9   MEAN CORP HGB CONC      31.0 - 37.0 g/dL 33.8   RBC DISTRIBUTION WIDTH STD DEV      35.1 - 46.3 fL 40.6   PLATELET COUNT      150 - 400 TH/uL 161   MEAN PLATELET  VOLUME      8.7 - 12.5 fL 11.6   NRBC %      0.0 - 0.0 % 0.0   ABSOLUTE NRBC COUNT      0.0 - 0.0 TH/uL 0.0         Pertinent Imaging/Procedures:  EGD/colonoscopy (06/29/2020)   Post Procedure Diagnosis:       - Barrett's esophagus. Biopsied.       - Mucosal nodule found in the esophagus.       - Chronic gastritis. Biopsied.       - No gross lesions in the entire examined duodenum    Post Procedure Diagnosis:       - Diverticulosis in the sigmoid colon, in the descending colon and at the        splenic flexure.       - Non-bleeding internal hemorrhoids.       - No specimens collected.    PATH:  >>FINAL DIAGNOSIS<<      ANTRUM BIOPSY:   CHRONIC ACTIVE GASTRITIS WITH MICROORGANISMS COMPATIBLE WITH HELICOBACTER   PYLORI-LIKE SPECIES IDENTIFIED.      GASTROESOPHAGEAL JUNCTION BIOPSY:   MILD TO MODERATE ACTIVE ESOPHAGITIS.   CARDIAC TYPE AND FUNDIC MUCOSA WITH ACUTE AND CHRONIC INFLAMMATION AND   SURFACE EPITHELIAL REGENERATIVE CHANGE.   NO INTESTINAL METAPLASIA OR DYSPLASIA IS IDENTIFIED.      Assessment:  51F with PMH of newly diagnosed lung Ca, diverticulosis, Hep B (undetectable viral load 04/2021), hx h.pylori infxn (treated, neg stool antigen) presents for GI evaluation for diverticulitis. ED visit at outside hospital 01/2021 with diverticulitis on CT, treated with course of antibiotics. Pt denies abdominal pain, change in stools, hematochezia, N/V, or fever. Last colonoscopy (06/2020) w/ diverticulosis, internal hemorrhoids. Pt with hx of neoplastic polyps on prior colonoscopies. EGD at that time w/ positive h.pylori, and small mucosal nodule at GE junction. Recent EGD at [facility] (02/2021) w/ no endoscopic abnormalities noted (unable to view pathology).     Plan:  - Given recent colonoscopy 1 year ago, no indication for repeat colonoscopy now. Recommend repeat for surveillance in 2026 (5 years from last colonoscopy)  - High fiber diet and consideration of fiber supplementation which may decrease recurrence  of diverticulitis  - Avoid NSAIDs  - Encourage tobacco cessation  - Reviewed signs of diverticulitis and when to present to ED including sever abdominal pain, fever, nausea, and/or marked change in bowel habits, blood in stools    All questions answered at the time of the televisit.  Pt understands and agrees with plan. Pt will call with questions/concerns.    Discussed with GI attending Dr. Marcelene Butte who is in agreement with the above recommendations.    I have spent 30 minutes with this patient/proxy on direct care and in counseling or coordination of care, chart review, and note writing regarding above issues/diagnosis on this date of service.    Levada Dy Rohit Deloria, PA-C

## 2021-06-13 ENCOUNTER — Ambulatory Visit
Payer: No Typology Code available for payment source | Attending: Thoracic Surgery (Cardiothoracic Vascular Surgery) | Admitting: Thoracic Surgery (Cardiothoracic Vascular Surgery)

## 2021-06-13 ENCOUNTER — Other Ambulatory Visit: Payer: Self-pay

## 2021-06-13 DIAGNOSIS — C3432 Malignant neoplasm of lower lobe, left bronchus or lung: Secondary | ICD-10-CM | POA: Insufficient documentation

## 2021-06-13 LAB — FUNGAL CULTURE

## 2021-06-13 NOTE — Progress Notes (Signed)
THORACIC SURGERY CLINIC FOLLOW UP    CC: Left lower lobe lung cancer    HPI:    58F with biopsy proven LLL primary lung adenocarcinoma.  Her PET showed no evidence of advanced disease.  Her PFTs are adequate for surgery.  She has no major health problems.  She is smoking currently, but stressed and not interested in quitting until after surgery. She lives alone. She has 3 children, the closest geographically is her son Victoria Macdonald whom I spoke to on the phone today.      Objective:  Extended Vitals not filed for this encounter.    Exam:  Appears well    New Imaging: CT lung screening follow-up 03/18/2021    IMPRESSION:     Slight increase in size of the larger groundglass nodule in the left lower lobe. Tissue sampling should be considered.     LUNG-RADS category: 4A - Suspicious       RECOMMENDATIONS: Biopsy - Follow up with PET/CT and/or tissue sampling in high probability of malignancy and comorbidities based on ACR Lung-RADS Version 1.1. . Lung Nodule Clinic consultation can be considered.       [facility] is an ACR accredited site for LDCT screening program.         PET 05/24/2021 at [facility] [id number] [place] [phone]      Radiology Report FDG TUMOR IMAGING (PET-CT) Study Date of 05/24/2021         [name] L.   05/24/21    FDG TUMOR IMAGING (PET-CT)  [phone]    Reason: 54 YEAR OLD WOMAN SMOKER, 2CM LLL GROWING PULMONARY NODULE. ([facility]) DISEASE PROGRESSION?               FinalReport   HISTORY:16 year oldwomanwithhistoryoftobaccousefoundhave2cmleft   lowerlobepulmonarynodule.      Siteofdisease:Leftlowerlobe   Histopathologicaldiagnosis:Noneavailable      COMPARISON:Noneavailable      TECHNIQUE:ISOTOPEDATA:(05/24/21)7.57mCiF-18FDG;LABDATA:100mg /dL   Glucose;CTDLP:475mG y-cm.      Approximately1hourafterintravenousadministrationofF-78fluorodeoxyglucose    (FDG),non-contrastCTimageswereobtainedforattenuationcorrectionandfor   fusionwithemissionPETimages.(Thenon-contrastCTimagesarenotusedto   diagnosediseaseindependentlyofthePETimages.)Aseriesofoverlapping   emissionPETimageswasthenobtained.Theareaimagedspannedtheregionfrom   theskullbasetothemidthigh.      Computedtomography(CT)imageswereco-registeredandfusedwithemissionPET   imagestoassistwiththeanatomiclocalizationoftraceruptake.The   determinationofthesiteoftraceruptakeseenonPETdatacanhaveimportant   implicationsregardingthesignificanceofthatuptake.      FINDINGS:HEAD/NECK:ThereisnoabnormalFDGuptakewithinthevisualizedhead   orneck.Thereisnocervicallymphadenopathy.      CHEST:Non-breathholdtechniquelimitsevaluationofthepulmonaryparenchyma.   Thereisa93mmnonavidrightupperlobepulmonarynodule(CT179;PET90).In   theleftlowerlobe,thereisasubsolidpulmonarynodulemeasuring1.9x1.9   cmwithSUVmax3.6(CT209;PET105).Thereisnomediastinalorhilar   lymphadenopathy.      Therearemoderatecoronaryarterycalcificationsandmildatherosclerotic   calcificationsoftheaorticvalveleaflets.Atheroscleroticcalcificationsof   theaorticarchandthegreatvesselsoftheneckaremoderate.Theesophagus   isunremarkable.      ABDOMEN/PELVIS:ThereisnoabnormalFDGuptakewithintheabdomenorpelvis.   Theunenhancedappearanceoftheliver,spleen,pancreas,adrenals,andkidneys   isunremarkable.Thestomach,smallbowel,andlargebowelarewithinnormal   limits.Thereisdiverticulosiswithoutevidenceofdiverticulitis.The   reproductiveorgansareunremarkable.Thereisnofreefluidinthepelvis.   Thereisnopelvicsidewalloringuinallymphadenopathy.      MUSCULOSKELETAL:ThereisnoabnormalFDGuptakewithinthemusculoskeletal    system.Thereisnosuspiciouslyticorscleroticlesion.      Physiologicuptakeisseeninthebrain,myocardium,salivaryglands,GIandGU   tracts,liverandspleen.      IMPRESSION:1.FDGavidsubsolidpulmonarynoduleintheleftlowerlobe   measures1.9cmwithSUVmax3.6.Thisfindingsconcerningforan   adenocarcinomaspectrumlesion.2.Nomediastinalorhilarlymphadenopathy      Sotman,Timothy(residentphysician)   J.AnthonyParker,M.D.,Ph.D.(attendingphysician)   Byelectronicallysigningthisreport,I,theattendingphysician,attestthatIhave   reviewedtheimagesassociatedwiththeaboveexamination(s)and   agreewiththefindingsasdocumentedabove.   Electronicallysignedby:J.AnthonyParker,M.D.,Ph.D.on08/02/20223:48PM         Pathology:  CT-guided lung biopsy left side 05/13/2021: Primary lung adenocarcinoma    Pulmonary function tests 04/2021: FEV1 93% predicted, DLCO 69% predicted        A/P:    Pt is a 75 year old female with a clinical stage I left lower lobe primary lung adenocarcinoma.    Today, we again discussed the minimally invasive robotic assisted thoracoscopic left lower lobectomy.  She is scheduled for surgery next week on Monday, 06/20/2021.    Risk the procedure including infection, bleeding, conversion to open thoracotomy, cardiopulmonary complications, and even death were outlined.    Valinda Party, MD  Thoracic Surgery

## 2021-06-13 NOTE — H&P (View-Only) (Signed)
THORACIC SURGERY CLINIC FOLLOW UP    CC: Left lower lobe lung cancer    HPI:    5F with biopsy proven LLL primary lung adenocarcinoma.  Her PET showed no evidence of advanced disease.  Her PFTs are adequate for surgery.  She has no major health problems.  She is smoking currently, but stressed and not interested in quitting until after surgery. She lives alone. She has 3 children, the closest geographically is her son Josph Macho whom I spoke to on the phone today.      Objective:  Extended Vitals not filed for this encounter.    Exam:  Appears well    New Imaging: CT lung screening follow-up 03/18/2021    IMPRESSION:     Slight increase in size of the larger groundglass nodule in the left lower lobe. Tissue sampling should be considered.     LUNG-RADS category: 4A - Suspicious       RECOMMENDATIONS: Biopsy - Follow up with PET/CT and/or tissue sampling in high probability of malignancy and comorbidities based on ACR Lung-RADS Version 1.1. . Lung Nodule Clinic consultation can be considered.       [facility] is an ACR accredited site for LDCT screening program.         PET 05/24/2021 at [facility] [id number] [place] [phone]      Radiology Report FDG TUMOR IMAGING (PET-CT) Study Date of 05/24/2021         Tanvir Hipple L.   05/24/21    FDG TUMOR IMAGING (PET-CT)  [phone]    Reason: 29 YEAR OLD WOMAN SMOKER, 2CM LLL GROWING PULMONARY NODULE. ([facility]) DISEASE PROGRESSION?               FinalReport   HISTORY:9 year oldwomanwithhistoryoftobaccousefoundhave2cmleft   lowerlobepulmonarynodule.      Siteofdisease:Leftlowerlobe   Histopathologicaldiagnosis:Noneavailable      COMPARISON:Noneavailable      TECHNIQUE:ISOTOPEDATA:(05/24/21)7.47mCiF-18FDG;LABDATA:100mg /dL   Glucose;CTDLP:475mG y-cm.      Approximately1hourafterintravenousadministrationofF-30fluorodeoxyglucose    (FDG),non-contrastCTimageswereobtainedforattenuationcorrectionandfor   fusionwithemissionPETimages.(Thenon-contrastCTimagesarenotusedto   diagnosediseaseindependentlyofthePETimages.)Aseriesofoverlapping   emissionPETimageswasthenobtained.Theareaimagedspannedtheregionfrom   theskullbasetothemidthigh.      Computedtomography(CT)imageswereco-registeredandfusedwithemissionPET   imagestoassistwiththeanatomiclocalizationoftraceruptake.The   determinationofthesiteoftraceruptakeseenonPETdatacanhaveimportant   implicationsregardingthesignificanceofthatuptake.      FINDINGS:HEAD/NECK:ThereisnoabnormalFDGuptakewithinthevisualizedhead   orneck.Thereisnocervicallymphadenopathy.      CHEST:Non-breathholdtechniquelimitsevaluationofthepulmonaryparenchyma.   Thereisa47mmnonavidrightupperlobepulmonarynodule(CT179;PET90).In   theleftlowerlobe,thereisasubsolidpulmonarynodulemeasuring1.9x1.9   cmwithSUVmax3.6(CT209;PET105).Thereisnomediastinalorhilar   lymphadenopathy.      Therearemoderatecoronaryarterycalcificationsandmildatherosclerotic   calcificationsoftheaorticvalveleaflets.Atheroscleroticcalcificationsof   theaorticarchandthegreatvesselsoftheneckaremoderate.Theesophagus   isunremarkable.      ABDOMEN/PELVIS:ThereisnoabnormalFDGuptakewithintheabdomenorpelvis.   Theunenhancedappearanceoftheliver,spleen,pancreas,adrenals,andkidneys   isunremarkable.Thestomach,smallbowel,andlargebowelarewithinnormal   limits.Thereisdiverticulosiswithoutevidenceofdiverticulitis.The   reproductiveorgansareunremarkable.Thereisnofreefluidinthepelvis.   Thereisnopelvicsidewalloringuinallymphadenopathy.      MUSCULOSKELETAL:ThereisnoabnormalFDGuptakewithinthemusculoskeletal    system.Thereisnosuspiciouslyticorscleroticlesion.      Physiologicuptakeisseeninthebrain,myocardium,salivaryglands,GIandGU   tracts,liverandspleen.      IMPRESSION:1.FDGavidsubsolidpulmonarynoduleintheleftlowerlobe   measures1.9cmwithSUVmax3.6.Thisfindingsconcerningforan   adenocarcinomaspectrumlesion.2.Nomediastinalorhilarlymphadenopathy      Sotman,Timothy(residentphysician)   J.AnthonyParker,M.D.,Ph.D.(attendingphysician)   Byelectronicallysigningthisreport,I,theattendingphysician,attestthatIhave   reviewedtheimagesassociatedwiththeaboveexamination(s)and   agreewiththefindingsasdocumentedabove.   Electronicallysignedby:J.AnthonyParker,M.D.,Ph.D.on08/02/20223:48PM         Pathology:  CT-guided lung biopsy left side 05/13/2021: Primary lung adenocarcinoma    Pulmonary function tests 04/2021: FEV1 93% predicted, DLCO 69% predicted        A/P:    Pt is a 75 year old female with a clinical stage I left lower lobe primary lung adenocarcinoma.    Today, we again discussed the minimally invasive robotic assisted thoracoscopic left lower lobectomy.  She is scheduled for surgery next week on Monday, 06/20/2021.    Risk the procedure including infection, bleeding, conversion to open thoracotomy, cardiopulmonary complications, and even death were outlined.    Valinda Party, MD  Thoracic Surgery

## 2021-06-14 ENCOUNTER — Ambulatory Visit
Admission: RE | Admit: 2021-06-14 | Discharge: 2021-06-14 | Disposition: A | Discharge status: Home / Self Care | Payer: No Typology Code available for payment source | Attending: Thoracic Surgery (Cardiothoracic Vascular Surgery) | Admitting: Thoracic Surgery (Cardiothoracic Vascular Surgery)

## 2021-06-14 ENCOUNTER — Encounter (HOSPITAL_BASED_OUTPATIENT_CLINIC_OR_DEPARTMENT_OTHER): Payer: Self-pay

## 2021-06-14 DIAGNOSIS — C3432 Malignant neoplasm of lower lobe, left bronchus or lung: Secondary | ICD-10-CM | POA: Diagnosis present

## 2021-06-14 DIAGNOSIS — Z01818 Encounter for other preprocedural examination: Secondary | ICD-10-CM | POA: Diagnosis present

## 2021-06-14 LAB — CBC WITH PLATELET
ABSOLUTE NRBC COUNT: 0 10*3/uL (ref 0.0–0.0)
HEMATOCRIT: 43.8 % (ref 34.1–44.9)
HEMOGLOBIN: 14.6 g/dL (ref 11.2–15.7)
MEAN CORP HGB CONC: 33.3 g/dL (ref 31.0–37.0)
MEAN CORPUSCULAR HGB: 29.3 pg (ref 26.0–34.0)
MEAN CORPUSCULAR VOL: 87.8 fl (ref 80.0–100.0)
MEAN PLATELET VOLUME: 11.1 fL (ref 8.7–12.5)
NRBC %: 0 % (ref 0.0–0.0)
PLATELET COUNT: 161 10*3/uL (ref 150–400)
RBC DISTRIBUTION WIDTH STD DEV: 41.5 fL (ref 35.1–46.3)
RED BLOOD CELL COUNT: 4.99 M/uL (ref 3.90–5.20)
WHITE BLOOD CELL COUNT: 5.8 10*3/uL (ref 4.0–11.0)

## 2021-06-14 LAB — COMPREHENSIVE METABOLIC PANEL
ALANINE AMINOTRANSFERASE: 10 U/L — ABNORMAL LOW (ref 12–45)
ALBUMIN: 4.6 g/dL (ref 3.4–5.2)
ALKALINE PHOSPHATASE: 67 U/L (ref 45–117)
ANION GAP: 12 mmol/L (ref 10–22)
ASPARTATE AMINOTRANSFERASE: 17 U/L (ref 8–34)
BILIRUBIN TOTAL: 0.7 mg/dL (ref 0.2–1.0)
BUN (UREA NITROGEN): 10 mg/dL (ref 7–18)
CALCIUM: 9.2 mg/dl (ref 8.5–10.1)
CARBON DIOXIDE: 25 mmol/L (ref 21–32)
CHLORIDE: 106 mmol/L (ref 98–107)
CREATININE: 0.8 mg/dL (ref 0.4–1.2)
ESTIMATED GLOMERULAR FILT RATE: >60 mL/min (ref >60)
Glucose Random: 87 mg/dL (ref 74–160)
POTASSIUM: 4.6 mmol/L (ref 3.5–5.1)
SODIUM: 143 mmol/L (ref 136–145)
TOTAL PROTEIN: 6.8 g/dL (ref 6.4–8.2)

## 2021-06-14 NOTE — H&P (View-Only) (Signed)
- SURGERY H&P NOTE -    Chief Complaint/HPI:     75 yo female smoker with bx proven adenoCA lung, LLL, scheduled for robotic, laparoscopic L lower lobectomy on 8/20.     (neg PET, good PFT's ~ see note dated 8/22 Dr Redmond Pulling for details).        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 pylori infection     Postmenopausal bleeding     Pseudophakia of both eyes     Astigmatism of both eyes with presbyopia     Dry eye     Squamous blepharitis of upper and lower eyelids of both eyes     Arthralgia     Urinary incontinence, nocturnal enuresis     Diverticulosis of large intestine     Barrett's esophagus     Primary cancer of left lower lobe of lung ([place])      Past Medical History:   Past Medical History:  08/13/2018: Astigmatism of both eyes with presbyopia  No date: Back pain  08/13/2018: Dry eye  05/13/2015: LLQ pain  08/13/2018: Squamous blepharitis of upper and lower eyelids of both   eyes    Past Surgical History:   Past Surgical History:  ~1985: ABDOMINAL SURGERY  09/02/27: BELPHAROPTOSIS REPAIR; Bilateral      Comment:  S/p bilateral ELA, lateral direct brow lift and excision               of xanthelasmas with full thickness skin grafts nasal                bridge bilaterally 09/11/2017  03/2017: CATARACT EXTRACTION, BILATERAL; Bilateral      Comment:  s/p cataract extraction with PC IOL OU, at [facility], Dr.   03/2017: PB - PB - AFTER CATARACT LASER SURGERY; Bilateral      Comment:  s/p yag laser capsulotomy OU at [facility]  ~1975: TUBAL LIGATION    Allergies:   Review of Patient's Allergies indicates:    Lactose intolerance*    Other (See Comments)    Comment:bloating    Outpatient Medications:     Current Outpatient Medications:     diclofenac (VOLTAREN) 75 MG EC tablet, Take 75 mg by mouth in the morning and 75 mg before bedtime., Disp: , Rfl:     diclofenac sodium 3 % gel, Apply 2 g topically 4 (four) times daily as needed, Disp: 300 g, Rfl: 3    montelukast (SINGULAIR) 10 MG tablet, Take 1 tablet by mouth nightly TAKE 1 TABLET BY MOUTH EVERY DAY AT NIGHT, Disp: 90 tablet, Rfl: 0    fexofenadine (ALLEGRA) 180 MG tablet, Take 1 tablet by mouth daily, Disp: 90 tablet, Rfl: 3    fluticasone (FLONASE) 50 MCG/ACT nasal spray, 1 spray by Each Nostril route daily, Disp: 1 each, Rfl: 11    diclofenac (VOLTAREN) 1 % GEL Gel, APPLY 2 G TOPICALLY 4 (FOUR)  TIMES DAILY AS NEEDED, Disp: 300 g, Rfl: 3    ergocalciferol (VITAMIN D2) 50000 UNIT capsule, Take 1 capsule by mouth once a week  for 8 doses, Disp: 8 capsule, Rfl: 0    artificial tears (LUBRIFRESH P.M.) OINT, Apply to eye, Disp: , Rfl:     Tobacco Use:   Social History    Tobacco Use      Smoking status: Current Every Day Smoker        Packs/day: 0.25        Years: 53.00        Pack years: 13.25      Smokeless tobacco: Never Used      Tobacco comment: currently smoking 1/2 pack per day      Alcohol:     Alcohol use No       Drugs:  no    Family History:   Review of patient's family history indicates:  Problem: Cancer - Other      Relation: Sister          Age of Onset: (Not Specified)          Comment: Uterus, ovarian, intestinal, liver  Problem: Glaucoma      Relation: Brother          Age of Onset: (Not Specified)  Problem: OTHER      Relation: Sister          Age of Onset: (Not Specified)          Comment: Dementia      ROS: no cardiac sxs.  No CP, SOB, no palpitations.  Able to climb 2 flights+ ("my knee might stop me")            No infectious sxs.            No bleeding d/o    LMP: post menopausal     EXAM:  BP 127/67    Pulse 80    Temp 97.6 F (36.4 C)  (Temporal)    Ht 5\' 1"  (1.549 m)    Wt 64.4 kg (142 lb)    LMP  (LMP Unknown)    SpO2 98%    BMI 26.83 kg/m   GENERAL - A&O, NAD  HEENT - MMM  HEART - RRR, no m,r,g  CHEST - CTA B/L  ABDOMEN - soft NT  EXT- no edema  NEUROLOGIC - grossly intact      Labs:   CBC  Chem peding         Lab Results   Component Value Date/Time    PT 11.6 04/27/2021 12:40 PM    APTT < 26.7 (L) 04/27/2021 12:40 PM    INR 1.0 04/27/2021 12:40 PM       Imaging: EKG ordered    PFT;s:          Assessment/Plan:    75 yo female smoker  w/ bx proven adenoCA left lower lobe scheduled for laparoscopic lobectomy on 8/29.   Neg PET for metastatic lesion.  PFT's w/  FEV1 1.90    Routine labs, EKG  Kefzol SQH and venodynes OC to OR      Arville Care, PA-C, 06/14/2021       Pager   [phone]

## 2021-06-14 NOTE — Anesthesia Preprocedure Evaluation (Addendum)
Pre-Anesthetic Note  .      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[place]) [C34.32]    Location: [facility] / [facility]    Surgeons: Leonor Liv, MD          Relevant Problems   No relevant active problems           Previous Anesthetic History:   Past Surgical History:  ~1985: ABDOMINAL SURGERY  09/02/27: BELPHAROPTOSIS REPAIR; Bilateral      Comment:  S/p bilateral ELA, lateral direct brow lift and excision               of xanthelasmas with full thickness skin grafts nasal                bridge bilaterally 09/11/2017  03/2017: CATARACT EXTRACTION, BILATERAL; Bilateral      Comment:  s/p cataract extraction with PC IOL OU, at [facility], Dr.   03/2017: PB - PB - AFTER CATARACT LASER SURGERY; Bilateral      Comment:  s/p yag laser capsulotomy OU at [place]  ~1975: TUBAL LIGATION        Medications  Current Outpatient Medications   Medication Sig    diclofenac (VOLTAREN) 75 MG EC tablet Take 75 mg by mouth in the morning and 75 mg before bedtime.    diclofenac sodium 3 % gel Apply 2 g topically 4 (four) times daily as needed    montelukast (SINGULAIR) 10 MG tablet Take 1 tablet by mouth nightly TAKE 1 TABLET BY MOUTH EVERY DAY AT NIGHT    fexofenadine (ALLEGRA) 180 MG tablet Take 1 tablet by mouth daily    fluticasone (FLONASE) 50 MCG/ACT nasal spray 1 spray by Each Nostril route daily    diclofenac (VOLTAREN) 1 % GEL Gel APPLY 2 G TOPICALLY 4 (FOUR) TIMES DAILY AS NEEDED    ergocalciferol (VITAMIN D2) 50000 UNIT capsule Take 1 capsule by mouth once a week  for 8 doses    artificial tears (LUBRIFRESH P.M.) OINT Apply to eye     No current facility-administered medications for this encounter.         Allergies:   Review of Patient's Allergies  indicates:   Lactose intolerance*    Other (See Comments)    Comment:bloating    Smoking, Alcohol, Drugs:  Social History    Tobacco Use      Smoking status: Current Every Day Smoker        Packs/day: 0.25        Years: 53.00        Pack years: 13.25      Smokeless tobacco: Never Used      Tobacco comment: currently smoking 5/day    Alcohol use: No      Alcohol/week: 0.0 standard drinks      Drug use: No       PMHx:  Past Medical History:  08/13/2018: Astigmatism of both eyes with presbyopia  No date: Back pain  08/13/2018: Dry eye  05/13/2015: LLQ pain  08/13/2018: Squamous blepharitis of upper and lower eyelids of both   eyes    Vitals  BP 127/67    Pulse 80  Temp 97.6 F (36.4 C) (Temporal)    Ht 5\' 1"  (1.549 m)    Wt 64.4 kg (142 lb)    LMP  (LMP Unknown)    SpO2 98%    BMI 26.83 kg/m     Review of Systems        Anesthetic History:   negative anesthesia history ROS           Cardiovascular: Negative negative for cardiovascular disease.    Pulmonary: Enlarging L sided nodule           Hepatic: Skin negative for hepatic disease.    Neurological: Negative for neurological diseases.    Gastrointestinal: GERD: hx H pylori 1 yr ago now resolved.   Endocrine: Negative for endocrine diseases/disorders.    Psychiatric: Negative for psychiatry diseases.        Physical Exam    General     Level of consciousness:  Alert   Airway     Mallampati:  II    TM distance:  >3 FB    Mouth opening:  <3 FB    Neck ROM:  Full   Teeth     Heart      Lungs              Pertinent Labs:   Lab Results   Component Value Date    NA 141 04/27/2021    K 4.6 04/27/2021    CREAT 0.8 04/27/2021    GLUCOSER 91 04/27/2021    WBC 5.6 04/27/2021    HCT 42.0 04/27/2021    PLTA 161 04/27/2021    PT 11.6 04/27/2021    APTT < 26.7 (L) 04/27/2021    INR 1.0 04/27/2021         Anesthesia Plan    ASA Score:     ASA:  2    Airway:      Mallampati:  II    Mouth opening:  <3 FB    Neck ROM:  Full    TM distance:  >3 FB    Plan: general and  epidural    Other information:     EKG Reviewed: : No      Full Stomach Precaution:: No      Post-Plan::  PACU    Anesthesia Assessment and Plan:        GA DLT aline possible thor epid if open    Informed Consent:     Anesthetic plan and risks discussed with:  Patient   Patient Consented

## 2021-06-14 NOTE — Discharge Instructions (Signed)
SURGICAL DAY CARE PRE-OPERATIVE INSTRUCTIONS    Before your Surgery    COVID-19 Instructions:    Unvaccinated Patients  You need to get tested for COVID before your surgery. This will be at our testing site at [place] will schedule    Please follow social distancing after your COVID test. You need to stay isolated as much as possible until your surgery. Please tell your surgeon if:  you get a fever or other symptoms of illness  you, or someone you live with, is exposed to a person with COVID or someone who is sick      COVID-19 vaccinated patients    If you have been fully vaccinated for COVID 19, greater than 2 weeks ago, you do not need to be tested for COVID before your procedure unless you will be staying in the hospital overnight after your surgery.    Please bring your vaccination card with you on the day of surgery.  Remember to follow social distancing to prevent any exposures to those who may be ill. Please tell your surgeon if you have a fever or other symptoms of illness.    The Day of Surgery  You should have someone drop you off at the hospital at the right time. Please remember to wear a face mask.  When you get here, please go to the registration area of the hospital.     When to [facility] on Monday, August  29 at (Time): 6:00 am       No aspirin, ibuprofen from now until after surgery, tylenol is OK. .      Currently, visitors cannot wait for you in the hospital during surgery. This is to protect all our patients. Please talk to [place] if you are a parent with a patient under 18 or a caregiver for a disabled patient.     You can not drive after surgery. You must have an adult pick you up and bring you home. Your ride should plan to pick you up outside the hospital.      We will call your ride to let them know when you can leave and where to meet you. They should call the recovery room (we will give you the number) when they arrive. We will bring you out to  meet them.      We suggest you plan to have someone help you at home after your surgery.     Other Instructions:     The night before your surgery, please do not eat or drink anything after midnight. You can not even have water, gum or hard candy.      Do not smoke the morning of your surgery.     Please leave all valuables at home. This includes jewelry, watches, and money.     Please take off fingernail polish before coming to the hospital. Do not wear any face or lip make-up.     If you are having eye surgery, do not wear any eye or face makeup. Avoid facial moisturizers and perfumes.     Please take out any hair extensions that you can remove.     Do not shave your surgical site.     Do not wear contact lenses.     Medicine:     Take the following medicine the night before surgery at bedtime:  usual medicines     Take the following medicine the morning of your surgery with only a sip of water:                       allegra  flonase

## 2021-06-14 NOTE — Surgery Pre-Op (Signed)
PAT Visit          Victoria Macdonald is a 75 year old female received a preoperative screening.        Appointments for Next 30 Days 06/14/2021 - 07/14/2021              Date Visit Type Department Provider     06/14/2021 11:00 AM PRE-ADMISSION TESTING VISIT [facility] [place]    Patient Instructions:     Please bring your current home medications or a list of home medications on the day of your appointment.                  Surgery Information     Future Procedures (Tomorrow to 06/14/2022)       Date Time Procedures Providers Location Status        06/20/2021 0710 Robotic assisted thoracoscopic left lower lobectomy - Left Leonor Liv [facility] OR Scheduled Open case     Procedures: Robotic assisted thoracoscopic left lower lobectomy - Left            Unscheduled Procedures       Date Time Procedures Providers Location Status        07/30/2018 1130 HYSTEROSCOPY, WITH DILATION AND CURETTAGE OF UTERUS - N/A [facility] OR Complete Open case     Procedures: HYSTEROSCOPY, WITH DILATION AND CURETTAGE OF UTERUS - N/A                      Procedure(s):  Robotic assisted thoracoscopic left lower lobectomy  06/20/2021        .     Language of care:English    Allergy History  Lactose Intolerance (Gi)      Past Medical History:  08/13/2018: Astigmatism of both eyes with presbyopia  No date: Back pain  08/13/2018: Dry eye  05/13/2015: LLQ pain  08/13/2018: Squamous blepharitis of upper and lower eyelids of both   eyes     has a past surgical history that includes Abdominal surgery (~1985); Tubal ligation (~9150); Blepharoptosis repair (Bilateral, 09/02/27); Cataract extraction, bilateral (Bilateral, 03/2017); and pb - post-cataract laser surgery (Bilateral, 03/2017).         Alcohol, Tobacco and Drug History:  Social History    Tobacco Use      Smoking status: Current Every Day Smoker        Packs/day: 0.25        Years: 53.00        Pack years: 13.25       Smokeless tobacco: Never Used      Tobacco comment: currently smoking 5/day    E-Cigarette/Vaping  E-Cigarette Use: Never User  Quit Date:   Nicotine:   Start Date:   THC:    Cartridges/Day:   CBD:   Other Substance:   Flavoring:       Alcohol use No       Drug use: No       Extended Emergency Contact Information  Primary Emergency Contact: [facility]  Address: [address]           [place], [place] [address]  Home Phone: [phone]  Relation: Son  Secondary Emergency Contact: [facility]  Address: [address]           [place], [place] [address]  Home Phone: [phone]  Relation: Son        Current Outpatient Medications  Medication Sig    diclofenac (VOLTAREN) 75 MG EC tablet Take 75 mg by mouth in the morning and 75 mg before bedtime.    diclofenac sodium 3 % gel Apply 2 g topically 4 (four) times daily as needed    montelukast (SINGULAIR) 10 MG tablet Take 1 tablet by mouth nightly TAKE 1 TABLET BY MOUTH EVERY DAY AT NIGHT    fexofenadine (ALLEGRA) 180 MG tablet Take 1 tablet by mouth daily    fluticasone (FLONASE) 50 MCG/ACT nasal spray 1 spray by Each Nostril route daily    diclofenac (VOLTAREN) 1 % GEL Gel APPLY 2 G TOPICALLY 4 (FOUR) TIMES DAILY AS NEEDED    ergocalciferol (VITAMIN D2) 50000 UNIT capsule Take 1 capsule by mouth once a week  for 8 doses    artificial tears (LUBRIFRESH P.M.) OINT Apply to eye     No current facility-administered medications for this encounter.         (Not in a hospital admission)      PAT Assessment    Airways WDL     Activity tolerance (How many flights of stairs): no sob or cp with stairs      Assistive Device: None           PAT Screening     Row Name 06/14/21 1031       Integumentary - Complete full head to toe skin assessment    Integumentary (WDL) WDL    Row Name 06/14/21 1031       STOP-Bang Questionaire     Do you snore loudly (loud enough to be heard through closed doors or   your bed-partner elbows you for snoring at night? 0     Do you often feel Tired, fatigued, or Sleepy during the daytime (such as   falling asleep when driving)? 0    Has anyone observed you stop breathing, or choking/gasping during your   sleep? 0    Do you have, or are being treated for, High Blood Pressure? 0    Height 5\' 1"  (1.549 m)    Weight 64.4 kg (142 lb)    BMI (Calculated) 26.89    Neck size large? 0    Stop-Bang Score Low    Row Name 06/14/21 1025       STOP-Bang Questionaire    Height 5\' 1"  (1.549 m)    Weight 64.4 kg (142 lb)    BMI (Calculated) 26.89                       06/14/21  1025 06/14/21  1031   BP: 127/67    Pulse: 80    Temp: 97.6 F (36.4 C)    TempSrc: Temporal    SpO2: 98%    Weight: 64.4 kg (142 lb) 64.4 kg (142 lb)   Height: 5\' 1"  (1.549 m) 5\' 1"  (1.549 m)           Patient issues currently are     .  Medication instructions were given. AVS given to patient.   CHX wipes instructions were given and reviewed.       Donalda Ewings, RN

## 2021-06-14 NOTE — H&P (Signed)
- SURGERY H&P NOTE -    Chief Complaint/HPI:     75 yo female smoker with bx proven adenoCA lung, LLL, scheduled for robotic, laparoscopic L lower lobectomy on 8/20.     (neg PET, good PFT's ~ see note dated 8/22 Dr Redmond Pulling for details).        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 pylori infection     Postmenopausal bleeding     Pseudophakia of both eyes     Astigmatism of both eyes with presbyopia     Dry eye     Squamous blepharitis of upper and lower eyelids of both eyes     Arthralgia     Urinary incontinence, nocturnal enuresis     Diverticulosis of large intestine     Barrett's esophagus     Primary cancer of left lower lobe of lung ([place])      Past Medical History:   Past Medical History:  08/13/2018: Astigmatism of both eyes with presbyopia  No date: Back pain  08/13/2018: Dry eye  05/13/2015: LLQ pain  08/13/2018: Squamous blepharitis of upper and lower eyelids of both   eyes    Past Surgical History:   Past Surgical History:  ~1985: ABDOMINAL SURGERY  09/02/27: BELPHAROPTOSIS REPAIR; Bilateral      Comment:  S/p bilateral ELA, lateral direct brow lift and excision               of xanthelasmas with full thickness skin grafts nasal                bridge bilaterally 09/11/2017  03/2017: CATARACT EXTRACTION, BILATERAL; Bilateral      Comment:  s/p cataract extraction with PC IOL OU, at [facility], Dr.   03/2017: PB - PB - AFTER CATARACT LASER SURGERY; Bilateral      Comment:  s/p yag laser capsulotomy OU at [facility]  ~1975: TUBAL LIGATION    Allergies:   Review of Patient's Allergies indicates:    Lactose intolerance*    Other (See Comments)    Comment:bloating    Outpatient Medications:     Current Outpatient Medications:     diclofenac (VOLTAREN) 75 MG EC tablet, Take 75 mg by mouth in the morning and 75 mg before bedtime., Disp: , Rfl:     diclofenac sodium 3 % gel, Apply 2 g topically 4 (four) times daily as needed, Disp: 300 g, Rfl: 3    montelukast (SINGULAIR) 10 MG tablet, Take 1 tablet by mouth nightly TAKE 1 TABLET BY MOUTH EVERY DAY AT NIGHT, Disp: 90 tablet, Rfl: 0    fexofenadine (ALLEGRA) 180 MG tablet, Take 1 tablet by mouth daily, Disp: 90 tablet, Rfl: 3    fluticasone (FLONASE) 50 MCG/ACT nasal spray, 1 spray by Each Nostril route daily, Disp: 1 each, Rfl: 11    diclofenac (VOLTAREN) 1 % GEL Gel, APPLY 2 G TOPICALLY 4 (FOUR)  TIMES DAILY AS NEEDED, Disp: 300 g, Rfl: 3    ergocalciferol (VITAMIN D2) 50000 UNIT capsule, Take 1 capsule by mouth once a week  for 8 doses, Disp: 8 capsule, Rfl: 0    artificial tears (LUBRIFRESH P.M.) OINT, Apply to eye, Disp: , Rfl:     Tobacco Use:   Social History    Tobacco Use      Smoking status: Current Every Day Smoker        Packs/day: 0.25        Years: 53.00        Pack years: 13.25      Smokeless tobacco: Never Used      Tobacco comment: currently smoking 1/2 pack per day      Alcohol:     Alcohol use No       Drugs:  no    Family History:   Review of patient's family history indicates:  Problem: Cancer - Other      Relation: Sister          Age of Onset: (Not Specified)          Comment: Uterus, ovarian, intestinal, liver  Problem: Glaucoma      Relation: Brother          Age of Onset: (Not Specified)  Problem: OTHER      Relation: Sister          Age of Onset: (Not Specified)          Comment: Dementia      ROS: no cardiac sxs.  No CP, SOB, no palpitations.  Able to climb 2 flights+ ("my knee might stop me")            No infectious sxs.            No bleeding d/o    LMP: post menopausal     EXAM:  BP 127/67    Pulse 80    Temp 97.6 F (36.4 C)  (Temporal)    Ht 5\' 1"  (1.549 m)    Wt 64.4 kg (142 lb)    LMP  (LMP Unknown)    SpO2 98%    BMI 26.83 kg/m   GENERAL - A&O, NAD  HEENT - MMM  HEART - RRR, no m,r,g  CHEST - CTA B/L  ABDOMEN - soft NT  EXT- no edema  NEUROLOGIC - grossly intact      Labs:   CBC  Chem peding         Lab Results   Component Value Date/Time    PT 11.6 04/27/2021 12:40 PM    APTT < 26.7 (L) 04/27/2021 12:40 PM    INR 1.0 04/27/2021 12:40 PM       Imaging: EKG ordered    PFT;s:          Assessment/Plan:    75 yo female smoker  w/ bx proven adenoCA left lower lobe scheduled for laparoscopic lobectomy on 8/29.   Neg PET for metastatic lesion.  PFT's w/  FEV1 1.90    Routine labs, EKG  Kefzol SQH and venodynes OC to OR      Arville Care, PA-C, 06/14/2021       Pager   [phone]

## 2021-06-18 LAB — EKG

## 2021-06-20 ENCOUNTER — Inpatient Hospital Stay (HOSPITAL_BASED_OUTPATIENT_CLINIC_OR_DEPARTMENT_OTHER): Payer: No Typology Code available for payment source

## 2021-06-20 ENCOUNTER — Encounter (HOSPITAL_BASED_OUTPATIENT_CLINIC_OR_DEPARTMENT_OTHER)
Admission: RE | Disposition: A | Discharge status: Home / Self Care | Payer: Self-pay | Source: Ambulatory Visit | Attending: Thoracic Surgery (Cardiothoracic Vascular Surgery)

## 2021-06-20 ENCOUNTER — Inpatient Hospital Stay
Admission: RE | Admit: 2021-06-20 | Discharge: 2021-06-22 | DRG: 165 | Disposition: A | Discharge status: Home / Self Care | Payer: No Typology Code available for payment source | Attending: Thoracic Surgery (Cardiothoracic Vascular Surgery) | Admitting: Thoracic Surgery (Cardiothoracic Vascular Surgery)

## 2021-06-20 ENCOUNTER — Inpatient Hospital Stay (HOSPITAL_BASED_OUTPATIENT_CLINIC_OR_DEPARTMENT_OTHER): Payer: No Typology Code available for payment source | Admitting: Certified Registered"

## 2021-06-20 ENCOUNTER — Other Ambulatory Visit: Payer: Self-pay

## 2021-06-20 ENCOUNTER — Encounter (HOSPITAL_BASED_OUTPATIENT_CLINIC_OR_DEPARTMENT_OTHER): Payer: Self-pay | Admitting: Thoracic Surgery (Cardiothoracic Vascular Surgery)

## 2021-06-20 DIAGNOSIS — J90 Pleural effusion, not elsewhere classified: Secondary | ICD-10-CM | POA: Diagnosis not present

## 2021-06-20 DIAGNOSIS — J984 Other disorders of lung: Secondary | ICD-10-CM

## 2021-06-20 DIAGNOSIS — F17200 Nicotine dependence, unspecified, uncomplicated: Secondary | ICD-10-CM | POA: Diagnosis present

## 2021-06-20 DIAGNOSIS — M810 Age-related osteoporosis without current pathological fracture: Secondary | ICD-10-CM | POA: Diagnosis present

## 2021-06-20 DIAGNOSIS — J939 Pneumothorax, unspecified: Secondary | ICD-10-CM

## 2021-06-20 DIAGNOSIS — Z902 Acquired absence of lung [part of]: Secondary | ICD-10-CM | POA: Diagnosis not present

## 2021-06-20 DIAGNOSIS — C3492 Malignant neoplasm of unspecified part of left bronchus or lung: Secondary | ICD-10-CM | POA: Diagnosis present

## 2021-06-20 DIAGNOSIS — K219 Gastro-esophageal reflux disease without esophagitis: Secondary | ICD-10-CM | POA: Diagnosis present

## 2021-06-20 DIAGNOSIS — F411 Generalized anxiety disorder: Secondary | ICD-10-CM | POA: Diagnosis present

## 2021-06-20 DIAGNOSIS — Z20822 Contact with and (suspected) exposure to covid-19: Secondary | ICD-10-CM | POA: Diagnosis present

## 2021-06-20 DIAGNOSIS — C3432 Malignant neoplasm of lower lobe, left bronchus or lung: Secondary | ICD-10-CM | POA: Diagnosis present

## 2021-06-20 LAB — COVID-19 INPATIENT: COVID-19 INPATIENT: NEGATIVE

## 2021-06-20 SURGERY — LOBECTOMY, LUNG, THORACOSCOPIC
Anesthesia: Epidural | Site: Chest | Wound class: Class I/ Clean

## 2021-06-20 MED ORDER — KETOROLAC TROMETHAMINE 30 MG/ML INJ
Freq: Once | Status: DC | PRN
Start: 2021-06-20 — End: 2021-06-20
  Administered 2021-06-20: 15 mg via INTRAVENOUS

## 2021-06-20 MED ORDER — BUPIVACAINE-EPINEPHRINE (PF) 0.5% -1:200000 IJ SOLN
INTRAMUSCULAR | Status: AC
Start: 2021-06-20 — End: 2021-06-20
  Administered 2021-06-20: 30 mL via SUBCUTANEOUS
  Filled 2021-06-20: qty 30

## 2021-06-20 MED ORDER — ROCURONIUM BROMIDE 100 MG/10ML IV SOLN
INTRAVENOUS | Status: AC
Start: 2021-06-20 — End: 2021-06-20
  Filled 2021-06-20: qty 10

## 2021-06-20 MED ORDER — PROPOFOL 200 MG/20ML IV EMUL
INTRAVENOUS | Status: AC
Start: 2021-06-20 — End: 2021-06-20
  Filled 2021-06-20: qty 40

## 2021-06-20 MED ORDER — FENTANYL CITRATE 0.05 MG/ML IJ SOLN
INTRAMUSCULAR | Status: AC
Start: 2021-06-20 — End: 2021-06-20
  Filled 2021-06-20: qty 4

## 2021-06-20 MED ORDER — LORAZEPAM 0.5 MG PO TABS
0.5000 mg | ORAL_TABLET | Freq: Every evening | ORAL | Status: AC | PRN
Start: 2021-06-20 — End: 2021-06-21
  Filled 2021-06-20: qty 1

## 2021-06-20 MED ORDER — LIDOCAINE HCL (PF) 2 % IJ SOLN
INTRAMUSCULAR | Status: AC
Start: 2021-06-20 — End: 2021-06-20
  Filled 2021-06-20: qty 10

## 2021-06-20 MED ORDER — MIDAZOLAM HCL 2 MG/2 ML IJ SOLN
INTRAMUSCULAR | Status: AC
Start: 2021-06-20 — End: 2021-06-20
  Filled 2021-06-20: qty 2

## 2021-06-20 MED ORDER — OXYCODONE HCL 5 MG PO TABS
5.00 mg | ORAL_TABLET | Freq: Once | ORAL | Status: AC
Start: 2021-06-21 — End: 2021-06-21
  Administered 2021-06-21: 5 mg via ORAL
  Filled 2021-06-20: qty 1

## 2021-06-20 MED ORDER — OCTYL CYANOACRYLATE TISSUE ADHESIVE
TOPICAL | Status: AC
Start: 2021-06-20 — End: 2021-06-20
  Administered 2021-06-20: 1 via TOPICAL
  Filled 2021-06-20: qty 1

## 2021-06-20 MED ORDER — SUGAMMADEX SODIUM 200 MG/2ML IV SOLN
Freq: Once | INTRAVENOUS | Status: DC | PRN
Start: 2021-06-20 — End: 2021-06-20
  Administered 2021-06-20: 200 mg via INTRAVENOUS

## 2021-06-20 MED ORDER — HEPARIN SODIUM (PORCINE) 5000 UNIT/ML IJ SOLN
5000.0000 [IU] | Freq: Once | INTRAMUSCULAR | Status: DC
Start: 2021-06-20 — End: 2021-06-20
  Filled 2021-06-20: qty 1

## 2021-06-20 MED ORDER — LACTATED RINGERS IV SOLN
INTRAVENOUS | Status: DC
Start: 2021-06-20 — End: 2021-06-20

## 2021-06-20 MED ORDER — MIDAZOLAM HCL 2 MG/2 ML IJ SOLN
Freq: Once | INTRAMUSCULAR | Status: DC | PRN
Start: 2021-06-20 — End: 2021-06-20
  Administered 2021-06-20: 1 mg via INTRAVENOUS

## 2021-06-20 MED ORDER — FENTANYL CITRATE 0.05 MG/ML IJ SOLN
Freq: Once | INTRAMUSCULAR | Status: DC | PRN
Start: 2021-06-20 — End: 2021-06-20
  Administered 2021-06-20 (×2): 50 ug via INTRAVENOUS
  Administered 2021-06-20: 100 ug via INTRAVENOUS

## 2021-06-20 MED ORDER — ONDANSETRON HCL 4 MG/2ML IJ SOLN
4.0000 mg | Freq: Once | INTRAMUSCULAR | Status: DC | PRN
Start: 2021-06-20 — End: 2021-06-20

## 2021-06-20 MED ORDER — PROPOFOL 200 MG/20 ML IV - AN
Freq: Once | INTRAVENOUS | Status: DC | PRN
Start: 2021-06-20 — End: 2021-06-20
  Administered 2021-06-20: 120 mg via INTRAVENOUS

## 2021-06-20 MED ORDER — OXYCODONE HCL 5 MG PO TABS
5.0000 mg | ORAL_TABLET | ORAL | Status: DC | PRN
Start: 2021-06-20 — End: 2021-06-22
  Administered 2021-06-20 – 2021-06-22 (×6): 5 mg via ORAL
  Filled 2021-06-20 (×6): qty 1

## 2021-06-20 MED ORDER — ONDANSETRON HCL 4 MG/2ML IJ SOLN
INTRAMUSCULAR | Status: AC
Start: 2021-06-20 — End: 2021-06-20
  Filled 2021-06-20: qty 2

## 2021-06-20 MED ORDER — OXYCODONE-ACETAMINOPHEN 5-325 MG PO TABS
1.0000 | ORAL_TABLET | ORAL | Status: DC | PRN
Start: 2021-06-20 — End: 2021-06-20

## 2021-06-20 MED ORDER — HYDROMORPHONE HCL 1 MG/ML IJ SOLN (SUPER ERX)
0.2500 mg | Status: AC | PRN
Start: 2021-06-20 — End: 2021-06-20
  Administered 2021-06-20 (×5): 0.25 mg via INTRAVENOUS
  Filled 2021-06-20 (×3): qty 0.5

## 2021-06-20 MED ORDER — SODIUM CHLORIDE 0.9 % MINI-BAG PLUS
2.00 g | Freq: Once | INTRAVENOUS | Status: AC
Start: 2021-06-20 — End: 2021-06-20
  Administered 2021-06-20: 2 g via INTRAVENOUS
  Filled 2021-06-20: qty 20

## 2021-06-20 MED ORDER — EPHEDRINE SULFATE 50 MG/ML IJ SOLN (SUPER ERX)
Freq: Once | INTRAMUSCULAR | Status: DC | PRN
Start: 2021-06-20 — End: 2021-06-20
  Administered 2021-06-20: 10 mg via INTRAVENOUS
  Administered 2021-06-20: 5 mg via INTRAVENOUS
  Administered 2021-06-20: 10 mg via INTRAVENOUS

## 2021-06-20 MED ORDER — PHENYLEPHRINE HCL-NACL 0.4-0.9 MG/10ML-% IV SOSY
PREFILLED_SYRINGE | Freq: Once | INTRAVENOUS | Status: DC | PRN
Start: 2021-06-20 — End: 2021-06-20
  Administered 2021-06-20 (×2): 80 ug via INTRAVENOUS
  Administered 2021-06-20: 40 ug via INTRAVENOUS

## 2021-06-20 MED ORDER — ROCURONIUM BROMIDE 100 MG/10ML IV SOLN
Freq: Once | INTRAVENOUS | Status: DC | PRN
Start: 2021-06-20 — End: 2021-06-20
  Administered 2021-06-20 (×2): 10 mg via INTRAVENOUS
  Administered 2021-06-20: 40 mg via INTRAVENOUS

## 2021-06-20 MED ORDER — HYDROMORPHONE HCL 2 MG/ML IJ SOLN (SUPER ERX)
Status: AC
Start: 2021-06-20 — End: 2021-06-20
  Filled 2021-06-20: qty 1

## 2021-06-20 MED ORDER — ONDANSETRON HCL 4 MG/2ML IJ SOLN
Freq: Once | INTRAMUSCULAR | Status: DC | PRN
Start: 2021-06-20 — End: 2021-06-20
  Administered 2021-06-20: 4 mg via INTRAVENOUS

## 2021-06-20 MED ORDER — ACETAMINOPHEN 325 MG PO TABS
650.0000 mg | ORAL_TABLET | Freq: Four times a day (QID) | ORAL | Status: DC | PRN
Start: 2021-06-20 — End: 2021-06-22
  Administered 2021-06-20 – 2021-06-22 (×5): 650 mg via ORAL
  Filled 2021-06-20 (×5): qty 2

## 2021-06-20 MED ORDER — DEXTROSE-NACL 5-0.45 % IV SOLN
INTRAVENOUS | Status: AC
Start: 2021-06-20 — End: 2021-06-21

## 2021-06-20 MED ORDER — KETAMINE HCL 10 MG/ML IJ SOLN
Freq: Once | INTRAMUSCULAR | Status: DC | PRN
Start: 2021-06-20 — End: 2021-06-20
  Administered 2021-06-20: 20 mg via INTRAVENOUS
  Administered 2021-06-20: 10 mg via INTRAVENOUS

## 2021-06-20 MED ORDER — HEPARIN SODIUM (PORCINE) 5000 UNIT/ML IJ SOLN
5000.0000 [IU] | Freq: Three times a day (TID) | INTRAMUSCULAR | Status: DC
Start: 2021-06-20 — End: 2021-06-22
  Administered 2021-06-20 – 2021-06-22 (×7): 5000 [IU] via SUBCUTANEOUS
  Filled 2021-06-20 (×7): qty 1

## 2021-06-20 MED ORDER — KETAMINE HCL 10 MG/ML IJ SOLN
INTRAMUSCULAR | Status: AC
Start: 2021-06-20 — End: 2021-06-20
  Filled 2021-06-20: qty 20

## 2021-06-20 MED ORDER — HYDROMORPHONE HCL 2 MG/ML IJ SOLN (SUPER ERX)
Freq: Once | Status: DC | PRN
Start: 2021-06-20 — End: 2021-06-20
  Administered 2021-06-20: .4 mg via INTRAVENOUS
  Administered 2021-06-20 (×2): .2 mg via INTRAVENOUS

## 2021-06-20 SURGICAL SUPPLY — 40 items
.9% NACL IRRIGATION 500ML (SOLUTION) ×2 IMPLANT
23G X 1.5IN SAFETY NEEDLE (NEEDLE) ×2 IMPLANT
ALEXIS WOUND RETRACTOR 5-9CM (SURGRET) IMPLANT
BASIC SINGLE BASIN TRAY (BASIN) ×2 IMPLANT
BIOSYN SUTURE 4-0 P12 30 (SUTURE) ×2
CHLORAPREP APPLICAOR 26ML ORGN (PREP) ×2 IMPLANT
COOK TISSUE POUCH 8"X5" (ENDOSPEC) ×2 IMPLANT
CRADLE ARM FOAM POSITIONER TLC (POSITION) IMPLANT
DRAIN SPONGE 4"X4" (DRESSING) IMPLANT
DRY SEAL CHEST DRAIN (DRAIN) ×2 IMPLANT
ENDO GIA PURPLE 60MM RELOAD (ENDOMECH) IMPLANT
ENDO GIA TAN 30MM CVD RELOAD (ENDOMECH) ×2 IMPLANT
ENDO GIA TAN 45MM CVD RELOAD (ENDOMECH) IMPLANT
ENDO STAPLER GIA 12MM (ENDOMECH) ×2 IMPLANT
ENDOGIA BLACK 60MM SIG60AXT (ENDOMECH) IMPLANT
ENDOGIA GRAY 45MM EGIA45AV (ENDOMECH) ×6 IMPLANT
FRED ANTI-FOG SOLUTION (ENDOSCP) ×2 IMPLANT
GENERAL ENDOSCOPY DRAPE (DRAPE) ×2 IMPLANT
IOBAN LG DRAPE (DRAPE) ×2 IMPLANT
LAP SAC TISSUE POUCH 4X6 (ENDOSPEC) IMPLANT
OPEN LAPAROTOMY PACK (PACK) ×2 IMPLANT
POLYSORB 3-0 V-12 NDL UNDYED (SUTURE) ×2
POLYSORB SUTURE 2-0 GS22 30 (SUTURE) ×1
ROB-NEL CATHETER 12FR (CATHURIN) ×2 IMPLANT
SOFSILK SUTURE 2-0 V20 30 (SUTURE) ×1
SPECIMEN BAG LARGE 15X17 (SPECIMEN) IMPLANT
SPONGE LAP,XRAY,RFID (SURGSPG) ×2 IMPLANT
STERILE WATER IRR. 500ML (SOLUTION) ×2 IMPLANT
STERNMAN SAW BLADE 29897100 (SURGPWR) IMPLANT
SURGEON BLADE #10 (SURGBLA) ×2 IMPLANT
SURGEON BLADE #15 (SURGBLA) ×2 IMPLANT
SURGEON GLOVE LF/PF 6 STER (GLOVE) ×4 IMPLANT
SURGICEL HEMOSTAT 4"X8" (WNDCLOSE) IMPLANT
SURGIPRO SUTURE 0 GS21 (SUTURE) ×2
SUTURE BIOSYN 4-0 P12 30IN (SUTURE) ×2 IMPLANT
SUTURE POLYSORB 2-0 GS22 30IN (SUTURE) ×1 IMPLANT
SUTURE POLYSORB 3-0 V20 30IN (SUTURE) ×2 IMPLANT
SUTURE SOFSILK 2-0 V20 30IN (SUTURE) ×1 IMPLANT
SUTURE SURGIPRO 0 GS21 30IN (SUTURE) ×2 IMPLANT
THORACIC CATHETER STR 24FR (CENTESIS) IMPLANT

## 2021-06-20 NOTE — Interval H&P Note (Signed)
Patient Assessment Update: (Fill out Prior to procedure or within 24 hours of  admission if H&P done pre-admission.)   Re-evaluation including history review and physical examination has been performed.    Patient's Condition No Change    Leonor Liv, MD, 06/20/2021

## 2021-06-20 NOTE — Surgery Post-Op (Signed)
SURGERY PROGRESS NOTE           Post-op day #:   0        Dx: adenoCA LLL    S/P: VATS, lobectomy        Subjective: no complaints.  No SOB, no nausea, pain managed w/ IV Dilaudid prn      Objective:  Vitals:  Temp:  [96.9 F (36.1 C)] 96.9 F (36.1 C)  Pulse:  [76-91] 82  Resp:  [8-19] 18  BP: (98-130)/(51-91) 122/64    Ins/Outs:  I/O 24 Hrs:  In: 600 [I.V.:500; IV Piggyback:100]  Out: 5 [Chest Tube:5]  No intake/output data recorded.    Continuous Infusions:   lactated ringers 30 mL/hr at 06/20/21 1200    dextrose 5 % and 0.45 % NaCl         EXAM:    VS: BP 122/64    Pulse 82    Temp 96.9 F (36.1 C) (Temporal)    Resp 18    Ht 5\' 1"  (1.549 m)    Wt 65.3 kg (144 lb)    LMP  (LMP Unknown)    SpO2 98%    BMI 27.21 kg/m   GENERAL - sleepy, comfortable  HEENT - O2 via NC  HEART - RRR, no m,r,g  CHEST - CTA B/L Chest tube L upper chest intact  Small air leak w/ cough/deep breathing  EXT- no edema, NT, venodynes    LABS:  Lab Results   Component Value Date    WBC 5.8 06/14/2021    HGB 14.6 06/14/2021    HCT 43.8 06/14/2021    PLTA 161 06/14/2021       Lab Results   Component Value Date    NA 143 06/14/2021    K 4.6 06/14/2021    CL 106 06/14/2021    CO2 25 06/14/2021    BUN 10 06/14/2021    CREAT 0.8 06/14/2021    TBILI 0.7 06/14/2021    AST 17 06/14/2021    ALT 10 (L) 06/14/2021    ALKPHOS 67 06/14/2021     Imaging:    CXR (PACU): small apical PTX    Assessment/Plan:  75 year old female adenoCA LLL  ~ 4 hrs s/p VATS, LLL.   Benign post op check  DTV    CT to water seal, if no AL in am, likely remove (tmw am)        Arville Care, PA-C, 06/20/2021      Pager:  [phone]

## 2021-06-20 NOTE — Interval H&P Note (Signed)
Patient Assessment Update: (Fill out Prior to procedure or within 24 hours of  admission if H&P done pre-admission.)   Re-evaluation including history review and physical examination has been performed.    Patient's Condition No Change.    Ms. Victoria Macdonald is a 66F w/ bx proven adenoCA left lower lobe scheduled for VATs lobectomy on 8/29. She has no new complaints. We will proceed with surgery this morning.     Sande Brothers, MD, 06/20/2021

## 2021-06-20 NOTE — Anesthesia Postprocedure Evaluation (Signed)
Anesthesia Post-Operative Evaluation Note    Patient: Victoria Macdonald           Procedure Summary     Date: 06/20/21 Room / Location: [facility] [facility] / [facility]    Anesthesia Start: 1165 Anesthesia Stop: 1100    Procedure: THORACOSCOPIC LEFT LOWER LUNG LOBECTOMY (Chest) Diagnosis:       Primary cancer of left lower lobe of lung ([place])      (Primary cancer of left lower lobe of lung ([place]) [C34.32])    Surgeons: Leonor Liv, MD Responsible Provider: Devra Dopp, MD    Anesthesia Type: general, epidural ASA Status: 2            POST-OPERATIVE EVALUATION    Anesthesia Post Evaluation    Vitals signs in patient's normal range: Yes  Respiratory function stable; airway patent: Yes  Cardiovascular function stable: Yes  Hydration status stable: Yes  Mental status recovered; patient participates in evaluation and/or is at baseline: Yes  Pain control satisfactory: Yes  Nausea and vomiting control satisfactory: Yes      PostOP disposition PACU  Anesthesia Observation no significant observation      Last vitals    BP: 116/81 (06/20/2021  6:57 AM)  Temp: 96.9 F (36.1 C) (06/20/2021  6:09 AM)  Pulse: 84 (06/20/2021  6:57 AM)  Resp: 14 (06/20/2021  6:57 AM)  SpO2: 98 % (06/20/2021  6:57 AM)

## 2021-06-20 NOTE — Op Note (Signed)
Date of Operation: 06/20/2021      PREPROCEDURE DIAGNOSIS:  Biopsy-proven left lower lobe primary lung   adenocarcinoma.     POSTOPERATIVE DIAGNOSIS:  Biopsy-proven left lower lobe primary lung   adenocarcinoma.     PROCEDURE:  Thoracoscopic left lower lobectomy and mediastinal lymph node   dissection.     SURGEON:  Leonor Liv, MD     ASSISTANT:  Sande Brothers, PGY-4     ANESTHESIA:  General.     INDICATIONS:  The patient is a 75 year old female who was found to have a   growing mixed density left lower lobe lung nodule.  Biopsy confirmed primary   lung adenocarcinoma.  PET scan confirmed no evidence of metastatic disease.    Pulmonary function testing was adequate for surgery.  Surgical resection as   well as alternate treatment modalities such as RFA, microwave ablation and   radiation were discussed with the patient.  She opted to move forward with   surgery.  Risks of surgery including infection, bleeding, conversion to open,   cardiopulmonary complications and death were outlined.  She agreed and   consented to proceed.     DESCRIPTION OF PROCEDURE:  The patient was brought to the operating room,   sedated and intubated.  She was positioned in the right lateral decubitus with   the left side up.  Bed was flexed, pressure points were padded and she was   secured to the bed.  Posterior axillary line camera port was inserted in the   chest after she was prepped and draped in standard surgical fashion.  Two   additional anterior working ports were placed and 1 posterior working port was   placed.  Inferior pulmonary ligament was taken down.  Level 9 lymph nodes   were harvested.  Inferior pulmonary vein was identified.  Anterior hilar   pleura over the vein was opened.  We then turned our attention posteriorly.    We identified the bronchus and level 7 lymph nodes, which were harvested.  We   isolated and transected the lower lobe vein using a vascular staple fire.  We   then identified the pulmonary artery  posteriorly near the fissure.  We   harvested a level 10L lymph node in the process of this.  We then isolated and   transected the lower lobe bronchus after inflation test confirmed inflation   of the upper lobe.  This was performed with a purple staple fire.  We then   turned our attention to the fissure.  The interlobar PA was identified.    Posterior fissure was completed after the superior segmental PA branch was   identified.  This was done simply using cautery as there was very little   parenchyma here.  The interlobar PA was dissected out until the lingular PA   branches were identified.  Level 11 lymph node was harvested at this area.  I   then was able to take a right angle and pass it below the upper lobe vein and   into the fissure below the lingular PA.  I placed a vessel loop, followed by a   red rubber catheter and stapled this using a 60 purple staple fire.  We then   divided the lower lobe PA branches using a single 45 tan vascular staple fire.    Specimen was removed via a LapSac through the utility incision anteriorly,   which required enlarging.  We then harvested a level 5 lymph node  in the AP   window.  We irrigated the chest with water.  We noted hemostasis at all   surgical sites including ports.  We placed a 24-French posterior apical chest   tube, inflated the lung and closed the ports in layers.  Dermabond was placed   as the only dressing.     COMPLICATIONS:  None.     ESTIMATED BLOOD LOSS:  10 mL     FLUIDS:  See anesthesia record.     DISPOSITION:  PACU in excellent condition.     Of note, I was scrubbed and present for the entire procedure.                                                             Reviewed and Electronically Signed By: Leonor Liv, MD  Sig Date: 06/20/2021  Sig Time: 11:30:45  Dictated By: Leonor Liv, MD  Dict Date: 06/20/2021 Dict Time: 11:15 AM      Dictation Date and Time:  06/20/2021 11:15:39  Transcription Date and Time:  06/20/2021 11:27:00    eScription Dictation id: [id number]

## 2021-06-20 NOTE — Brief Op Note (Signed)
Brief Procedure or Operative Note    Procedure:  Procedure(s):  THORACOSCOPIC LEFT LOWER LUNG LOBECTOMY    Pre-Procedure Diagnosis: Pre-op Diagnosis     * Primary cancer of left lower lobe of lung ([place]) [C34.32]     Post-Procedure Diagnosis: Post-op Diagnosis     * Primary cancer of left lower lobe of lung [facility]) [C34.32]    Surgeon: Primary: Leonor Liv, MD    Assistant (if applicable):     Type of Anesthesia:  General    Findings:   no abnormalities    Estimated Blood Loss: 10 mL    Specimens Removed:   ID Type Source Tests Collected by Time   A : LEVEL 9 Lymph Node Lymph Node SURGICAL PATH SPECIMEN LYMPH NODE Leonor Liv, MD 06/20/2021 [phone]   B : LEVEL 7 Lymph Node Lymph Node SURGICAL PATH SPECIMEN LYMPH NODE Leonor Liv, MD 06/20/2021 [phone]   C : LEVEL 10 LEFT Lymph Node Lymph Node SURGICAL PATH SPECIMEN LYMPH NODE Leonor Liv, MD 06/20/2021 [phone]   D : LEFT LOWER LOBE Lung Lung SURGICAL PATH SPECIMEN LUNG Leonor Liv, MD 06/20/2021 1003   E : LEVEL 11 LEFT Lung Lung SURGICAL PATH SPECIMEN LUNG Leonor Liv, MD 06/20/2021 1007   F : LEVEL 5 Lung Lung SURGICAL PATH SPECIMEN LUNG Leonor Liv, MD 8/63/8177 1165       Complications:  None    Other (e.g. Implants):  Nothing was implanted during the procedure   Peripheral IV 06/20/21 Left Antecubital fossa (Active)         Sande Brothers, MD  Pager : [phone]                  .

## 2021-06-20 NOTE — Plan of Care (Addendum)
Arrived on stretcher from pacu. Ambulated to bathroom, voided 269ml and ambulated back to bed.  HOB @60degrees .  AAOx4.  Unlabored breathing, CT to left lateral chest, 31ml serosanguinous drainage. C/o pain at insertion site and upper back and shoulder, offered and received oxycodone and tylenol, evaluate efficacy. Continuous O2 sat monitoring.  Weaned to RA 100% on RA.  Abdomen SNT ND no NVD, felt like regular dinner was too heavy and opted for clear liquids, tolerating well. No edema.  Oriented to room, CT, IVF, CB in reach, bed low. Continue to monitor and treat.       Problem: Safety  Goal: Free from accidental physical injury  Outcome: Progressing     Problem: Pain  Goal: Patient's pain/discomfort is manageable  Outcome: Progressing     Problem: Knowledge Deficit  Goal: Patient/S.O. demonstrates understanding of disease process, treatment plan, medications, and discharge instructions.  Outcome: Progressing  orientation

## 2021-06-21 ENCOUNTER — Inpatient Hospital Stay (HOSPITAL_BASED_OUTPATIENT_CLINIC_OR_DEPARTMENT_OTHER): Payer: No Typology Code available for payment source

## 2021-06-21 DIAGNOSIS — J939 Pneumothorax, unspecified: Secondary | ICD-10-CM | POA: Diagnosis not present

## 2021-06-21 LAB — CBC WITH PLATELET
ABSOLUTE NRBC COUNT: 0 10*3/uL (ref 0.0–0.0)
HEMATOCRIT: 39 % (ref 34.1–44.9)
HEMOGLOBIN: 12.7 g/dL (ref 11.2–15.7)
MEAN CORP HGB CONC: 32.6 g/dL (ref 31.0–37.0)
MEAN CORPUSCULAR HGB: 29.3 pg (ref 26.0–34.0)
MEAN CORPUSCULAR VOL: 89.9 fl (ref 80.0–100.0)
MEAN PLATELET VOLUME: 12 fL (ref 8.7–12.5)
NRBC %: 0 % (ref 0.0–0.0)
PLATELET COUNT: 130 10*3/uL — ABNORMAL LOW (ref 150–400)
RBC DISTRIBUTION WIDTH STD DEV: 43.2 fL (ref 35.1–46.3)
RED BLOOD CELL COUNT: 4.34 M/uL (ref 3.90–5.20)
WHITE BLOOD CELL COUNT: 10.9 10*3/uL (ref 4.0–11.0)

## 2021-06-21 LAB — BASIC METABOLIC PANEL
ANION GAP: 11 mmol/L (ref 10–22)
BUN (UREA NITROGEN): 13 mg/dL (ref 7–18)
CALCIUM: 7.8 mg/dl — ABNORMAL LOW (ref 8.5–10.1)
CARBON DIOXIDE: 25 mmol/L (ref 21–32)
CHLORIDE: 102 mmol/L (ref 98–107)
CREATININE: 0.7 mg/dL (ref 0.4–1.2)
ESTIMATED GLOMERULAR FILT RATE: >60 mL/min (ref >60)
Glucose Random: 127 mg/dL (ref 74–160)
POTASSIUM: 4.4 mmol/L (ref 3.5–5.1)
SODIUM: 138 mmol/L (ref 136–145)

## 2021-06-21 LAB — PHOSPHORUS: PHOSPHORUS: 2.8 mg/dL (ref 2.5–4.9)

## 2021-06-21 LAB — BLOOD SUGAR FINGERSTICK (POINT OF CARE): FINGERSTICK GLUCOSE: 124 mg/dl (ref 74–160)

## 2021-06-21 LAB — MAGNESIUM: MAGNESIUM: 1.9 mg/dL (ref 1.6–2.6)

## 2021-06-21 MED ORDER — HYDROMORPHONE HCL 1 MG/ML IJ SOLN (SUPER ERX)
0.25 mg | Freq: Once | Status: AC
Start: 2021-06-21 — End: 2021-06-21
  Administered 2021-06-21: 0.25 mg via INTRAVENOUS
  Filled 2021-06-21: qty 0.5

## 2021-06-21 MED ORDER — KETOROLAC TROMETHAMINE 15 MG/ML IJ SOLN
15.0000 mg | Freq: Four times a day (QID) | INTRAMUSCULAR | Status: DC | PRN
Start: 2021-06-21 — End: 2021-06-22
  Administered 2021-06-21 – 2021-06-22 (×4): 15 mg via INTRAVENOUS
  Filled 2021-06-21 (×4): qty 1

## 2021-06-21 NOTE — Progress Notes (Addendum)
[facility] day #:   2       Post-op day #:   1         Dx: adenoCA  LLL lung    S/P: VATS, L lower lobectomy      Subjective: pain L chest tube site,  Difficulty moving/breating deeply d/t pain.     No other complaints      Objective:  Vitals:  Temp:  [97.6 F (36.4 C)-99.3 F (37.4 C)] 98.6 F (37 C)  Pulse:  [76-190] 94  Resp:  [8-20] 20  BP: (98-152)/(51-91) 128/61    Ins/Outs:  I/O 24 Hrs:  In: 1031.3 [P.O.:120; I.V.:911.3]  Out: 390 [Urine:200; Chest Tube:190]  I/O this shift:  In: 120 [P.O.:120]  Out: -     Continuous Infusions:   dextrose 5 % and 0.45 % NaCl 75 mL/hr at 06/20/21 1751       EXAM:    VS: BP 128/61    Pulse 94    Temp 98.6 F (37 C) (Oral)    Resp 20    Ht 5\' 1"  (1.549 m)    Wt 65.3 kg (144 lb)    LMP  (LMP Unknown)    SpO2 96%    BMI 27.21 kg/m   GENERAL - A&O, lying still in bed (so as to avoid pain)  HEART - RRR  CHEST - CTA B/L diminished at both bases, difficulty deep breathing d/t pain. CT dressing dry/intact.  No clear air leak (unable to breathe deeply or cough forcibly)  ABDOMEN - soft  EXT- no edema, NT, venodynes    LABS:  Lab Results   Component Value Date    WBC 10.9 06/21/2021    HGB 12.7 06/21/2021    HCT 39.0 06/21/2021    PLTA 130 (L) 06/21/2021       Lab Results   Component Value Date    NA 143 06/14/2021    K 4.6 06/14/2021    CL 106 06/14/2021    CO2 25 06/14/2021    BUN 10 06/14/2021    CREAT 0.8 06/14/2021    TBILI 0.7 06/14/2021    AST 17 06/14/2021    ALT 10 (L) 06/14/2021    ALKPHOS 67 06/14/2021       Lab Results   Component Value Date    PT 11.6 04/27/2021    INR 1.0 04/27/2021    APTT < 26.7 (L) 04/27/2021         Assessment/Plan:  75 year old female smoker one day s/p VATS, L lower lobectomy, overall doing well.    Pain control required one extra dose of IV dilaudid overnight.  Will change tylenol to scheduled and pt aware of prn oxycodone.  Should improve greatly once chest tube is removed.    CXR this am, if no PTX anticipate d/cing  CT, checking post pull film 3-4 hrs later,  and if cleared by PT likely d/c home late today.    Plan d/w Dr Redmond Pulling via chief resident Dr Crosby Oyster, PA-C, 06/21/2021      Pager:   [phone]      Addendum:  CXR:  Small apical PTX  Chest tube output additional 40 cc once patient sitting upright (total ~ 240 - 250 cc)    D/w Dr Jari Pigg tube in today given borderline high output. Toradol (15mg ) OK for pain management.  Plan to d/c CT in am and d/c home.  PT has cleared patient for discharge home.    Arville Care, PA-C, 06/21/2021

## 2021-06-21 NOTE — Plan of Care (Signed)
Problem: Pain  Goal: Patient's pain/discomfort is manageable  Description: Assess and monitor patient's pain using appropriate pain scale. Collaborate with interdisciplinary team and initiate plan and interventions as ordered. Re-assess patient's pain level 30 - 60 minutes after pain management intervention.   Outcome: Progressing   Patient initially reported pain of 3/10. However, pain slightly worsened around 2300 and patient received PRN oxycodone for pain management. Pain did not improve and surgical PA aware and received additional dose of oxycodone for pain. Patient aware to notify RN if pain does not improve or worsens.     Patient alert and oriented x4. Patient has chest tube to water seal to L chest wall with bloody drainage. Dressing remains clean dry and intact. Two surgical incisions noted on either side of dressing closed with surgical glue and open to air. Patient denied any SOB lung sounds on R side clear and increased aeration noted to L side. Patient has intermittent dry cough. No crepitus noted or air leak apparent. Patient denied any abdominal pain or nausea. Patient aware to call if needing anything during the night. Patient remains on continuous pulse oxymetry.

## 2021-06-21 NOTE — Progress Notes (Signed)
Discussed pt in MDR, 75 year old female admitted for Primary cancer of left lower lobe of lung ([place]) [C34.32]  Primary lung adenocarcinoma, left ([place]) [C34.92]. Pt lives independently alone in an apartment in [place]. She is not active with any services. D/c planning pending medical clearance and recs. Can be driven home by son. Will continue to follow until d/c.     06/21/21 1457   Admission   Reviewed for admission Yes   Observation Notice delivered to patient No   Does patient have prescription drug coverage? Yes   Reason for Admission LLL Lung CA   Readmission Assessment  No   Mental Status Upon Admission   Mental Status Upon Admission AO   Demographics   Demographic Information Correct Yes   Patient Status   Is the patient own decision-maker? Yes   Is a Social Work consult needed for [place] Proxy or Guardianship? No   Care Giver   Do you have a caregiver?  No   Psychosocial   Admitted From: Home   Issues Related To: No issues   Therapist, music Care Provider   Primary Caretaker for Someone No   Providing self care at home? Yes   Functional Screen   * Toileting Independence   * Feeding Independence   * Transfers Independent   * Ambulation Independent   Mobility Devices None   Living Situation   Lives With Alone   Type of Residence House/apartment   Living Setting Apartment   Anticipated Discharge Plan   Expected Discharge Date 06/22/21   Anticipated Outcome Home (Independent)   Transportation at Discharge Family   Patient expects to be discharged to: Home   Interpreter Requested No   Home Care Services N

## 2021-06-21 NOTE — Initial Assessments (Signed)
S: Pt agreeable to physical therapy initial evaluation this morning. Reports of increased pain with mobility around chest tube insertion.     O: PT initial evaluation completed today. Please see Vital Sign and Pain Assessment flowsheets for vitals and pain documentation. Plan of care has been reviewed and updated as necessary.     06/21/21 1003   Language Information   Language of Care English   Evaluation Type   Evaluation Type Initial Evaluation   Rehab Discipline   Rehab Discipline PT   Safety Devices   Type of Devices Call bell in place   Weight Bearing Status   RLE FWB   LLE FWB   RUE FWB   LUE FWB   Premorbid Mobility   Transfers Independent   Walking Independent   Walking assistive devices used None   ADL / IADL Baseline Status   ADL/IADL Baseline Status Yes   ADL's/IADL's   Dressing Independent   Bathing Independent   Toileting Independent   Household Chores Independent   Living Situation   Living Setting Apartment   Lives With Alone   Exterior Home Access No steps   [place] Access No steps   Patient Stated Goals   Patient stated goals To decrease pain   Strengths   Strengths Premorbid level of function;Support of immediate family;Ability to acquire knowledge   Barriers   Barriers Comorbidities   Expression   Primary Mode of Expression Verbal   Cognition   Orientation Level Grossly Intact   RUE Assessment   RUE Assessment WFL   LUE Assessment   LUE Assessment   (limited ROM d/t pain from chest tube insertion)   RLE Assessment   RLE Assessment WFL   Strength RLE   Overall Strength At least 3+/5 as seen in function   LLE Assessment   LLE Assessment WFL   Strength LLE   Overall Strength At least 3+/5 as seen in function   Mobility / Balance   Mobility / Balance Yes   Bed Mobility   Supine to Sit Min assist to left 1   Sit to Supine Close supervision   Transfers   Transfer Yes   Transfer 1   Transfer From 1 Sit   Transfer Type 1 To and from   Transfer to 1 Stand   Technique 1 Sit to stand;Stand to sit    Transfer Device 1 None   Transfer Level of Assistance 1 Close supervision   Trials/Comments 1 x 2   Gait   Gait Yes   Gait 1   Assistive Device 1 None   Pattern 1 L Decreased heel strike;R Decreased heel strike;Decreased stride length  (Decreased L arm swing d/t guarding around chest tube insertion)   Gait Assistance 1 Contact guard   Distance (Ft) 1 25 Feet   Activity Tolerance   Activity Tolerance Tolerates 10 - 20 min treatment with multiple rests   Balance   Sitting - Static Supports self independently with both upper extremities;Feet supported   Sitting - Dynamic Bilateral upper extremity supported;Feet supported;Moves/returns truncal midpoint 1-2 inches in multiple planes   Standing - Static No upper extremity supported;Able to maintain 60 sec   Standing - Dynamic Forward lean   Posture Rounded shoulders;Forward head   Ther Exercise   Therapeutic Exercise? No   Coordination   Gross Motor Performance Observation Tense   Plan   Prognosis Good   PT Frequency One time follow up visit   Recommendation   Recommendation Home independently  Equipment Recommended None     A: Pt is a 75 year old female whom presents POD #1 s/p VATS and L lower lobe lobectomy. Prior to admission, pt was independent in transfers, community ambulation and ADLs without an AD. Pt lives alone in an apartment with no stairs to enter. She has a son from [place] who will be present upon d/c to provide assistance as needed. On initial evaluation, pt limited by significant increase in pain with functional mobility. Minimal assist x 1 provided during supine > sit to aide with trunk righting as pt unable to roll onto side d/t chest tube. Increased time and effort required from pt to complete transfers d/t pain however pt able to perform without physical assistance from TW. Ambulated 25' under CGA x 1 without AD; no LOB noted however frequent rest breaks provided throughout d/t pain. At this time, believe pt is safe to d/c home independently when  medically cleared as she demonstrates safety with mobility; her primary limiting factor to d/c is the pain she experiences around the chest tube insertion with mobility therefore limiting her tolerance to activity. Pt would benefit from a one time follow up visit with PT to improve endurance and ensure for a safe d/c home.    PT Short Term Goals = PT Long Term Goals:  Pt will perform supine < > sit independently in 1 treatment.  Pt will perform sit < > stand independently in 1 treatment.  Pt will ambulate 20' without AD independently in 1 treatment.    P: One time PT follow up visit to further assess functional mobility and improve activity tolerance. Recommend pt discharges home independently.    Odelia Gage, [place], [place] # [id number]

## 2021-06-21 NOTE — Plan of Care (Signed)
Pt alert and oriented x4.  Calm and cooperative with all care.  Pt remains on cont pulse ox.  Left chest tube in place draing serous sanguinous fluid.  Pt oob to chair with assist.  Tolerated fair.  ISE teaching completed by RT, pt demonstrated ability to use.  Plan of care discussed with patient.  Will c/t monitor and support.     Problem: Safety  Goal: Free from accidental physical injury  Outcome: Progressing     Pt alert and oriented x4.  Pt at risk for fall r/t left chest tube and NC.  Pt oob standby assist.  Call bell use instructed and within reach.  Will c/t monitor.

## 2021-06-21 NOTE — Discharge Instructions (Addendum)
DISCHARGE INSTRUCTIONS    Diagnosis:  removal of left lower lobe of lung      Diet: Regular    Activity/Weight Bearing Status: no heavy lifting, pushing heavy objects w/ L arm for a few weeks after surgery    Showering:may shower 2 days after discharge    Dressing care:  you may notice a large amount of drainage from your previous chest tube site, this is not unexpected, OK to remove wet dressing covering old chest tube site,  pat area and cover w/ new bandage    Call office or return to ED if you have any of the following: Fever, redness or drainage, swelling or bleeding at incision, chest pain, shortness of breath, palpitations    If you experience pain, you may take one to two 325mg  tablets of tylenol every 6 hrs as needed (Do not exceed 3000mg  in 24 hours),  You may ALSO take 600mg  of ibuprofen/motrin every 6 hrs w/ food as needed (Do not exceed 3200mg  in 24 hours).  This means you can have one OR the other as often as every 3 hrs as needed  For stronger pain that is not well controlled with the above, you may take one oxycodone (narcotic pain medication) as needed, every 6 hrs.  Wean off this medication within 3 days after discharge.  It might constipate you so take extra water and fiber and if needed prune juice or milk of magnesia to keep your bowel movements regular. You may not drive or operate heavy machinery while taking narcotic pain medications.      **If you are taking narcotic pain medications, take a stool softener as well to prevent constipation.  You may use an over-the-counter product (examples: Colace, Dulcolax suppository, milk of magnesia) to help stimulate a bowel movement if needed.    **You should keep a thermometer at home to check for fever if you feel you are developing a temperature.    Follow-Up Appointments:    Follow up with Dr Redmond Pulling, your appointment is the [facility] Tuesday 9/13 @ 1pm at the [employer] [facility], 9th floor.    (Street address is [address], [place], phone  number at the [facility] for Dr Redmond Pulling is [phone])    Please show up at [place] that day and go to the 4th floor for a chest xray before your 1pm appointment.    Contact the office sooner if wound has redness, drainage or if you develop a temperature.    We are available 24/7 if you have ?'s:   [phone]

## 2021-06-22 ENCOUNTER — Inpatient Hospital Stay (HOSPITAL_BASED_OUTPATIENT_CLINIC_OR_DEPARTMENT_OTHER): Payer: No Typology Code available for payment source

## 2021-06-22 DIAGNOSIS — J90 Pleural effusion, not elsewhere classified: Secondary | ICD-10-CM | POA: Diagnosis not present

## 2021-06-22 DIAGNOSIS — Z902 Acquired absence of lung [part of]: Secondary | ICD-10-CM | POA: Diagnosis not present

## 2021-06-22 DIAGNOSIS — J939 Pneumothorax, unspecified: Secondary | ICD-10-CM | POA: Diagnosis not present

## 2021-06-22 MED ORDER — ACETAMINOPHEN 325 MG PO TABS
650.0000 mg | ORAL_TABLET | Freq: Four times a day (QID) | ORAL | Status: DC
Start: 2021-06-22 — End: 2021-06-22
  Administered 2021-06-22 (×2): 650 mg via ORAL
  Filled 2021-06-22 (×2): qty 2

## 2021-06-22 MED ORDER — NICOTINE 14 MG/24HR TD PT24
1.0000 | MEDICATED_PATCH | Freq: Every day | TRANSDERMAL | Status: DC
Start: 2021-06-22 — End: 2021-06-22
  Administered 2021-06-22: 1 via TRANSDERMAL
  Filled 2021-06-22: qty 1

## 2021-06-22 MED ORDER — IBUPROFEN 600 MG PO TABS
600.00 mg | ORAL_TABLET | Freq: Four times a day (QID) | ORAL | 0 refills | Status: AC | PRN
Start: 2021-06-22 — End: 2021-07-02

## 2021-06-22 MED ORDER — NICOTINE 14 MG/24HR TD PT24
1.00 | MEDICATED_PATCH | TRANSDERMAL | 0 refills | Status: AC
Start: 2021-06-22 — End: 2021-07-22

## 2021-06-22 MED ORDER — POLYETHYLENE GLYCOL 3350 17 G PO PACK
17.00 g | PACK | Freq: Every day | ORAL | 0 refills | Status: AC | PRN
Start: 2021-06-22 — End: 2021-07-02

## 2021-06-22 MED ORDER — ACETAMINOPHEN 325 MG PO TABS
650.00 mg | ORAL_TABLET | Freq: Four times a day (QID) | ORAL | 0 refills | Status: AC | PRN
Start: 2021-06-22 — End: 2021-07-02

## 2021-06-22 MED ORDER — IBUPROFEN 600 MG PO TABS
600.0000 mg | ORAL_TABLET | Freq: Four times a day (QID) | ORAL | Status: DC
Start: 2021-06-22 — End: 2021-06-22
  Administered 2021-06-22: 600 mg via ORAL
  Filled 2021-06-22: qty 1

## 2021-06-22 MED ORDER — OXYCODONE HCL 5 MG PO TABS
5.00 mg | ORAL_TABLET | Freq: Four times a day (QID) | ORAL | 0 refills | Status: AC | PRN
Start: 2021-06-22 — End: 2021-06-27

## 2021-06-22 MED FILL — OXYCODONE 5MG: 2 days supply | Qty: 8 | Fill #0 | Status: CP

## 2021-06-22 MED FILL — POLYETH GLYC POW 3350 NF (PACKETS): 10 days supply | Qty: 10 | Fill #0 | Status: CP

## 2021-06-22 MED FILL — NICOTINE TD DIS 14MG/24H: 28 days supply | Qty: 28 | Fill #0 | Status: CP

## 2021-06-22 MED FILL — IBUPROFEN 600MG: 5 days supply | Qty: 20 | Fill #0 | Status: CP

## 2021-06-22 NOTE — Plan of Care (Signed)
Problem: Safety  Goal: Free from accidental physical injury  Outcome: Progressing  Intervention: Initiate fall precautions  Description: Initiate Fall Precautions if patient scores at risk for falls.  Note: Pt. Requiring assistance OOB at this time secondary to chest tube, ambulating with a steady gait.     Problem: Pain  Goal: Patient's pain/discomfort is manageable  Description: Assess and monitor patient's pain using appropriate pain scale. Collaborate with interdisciplinary team and initiate plan and interventions as ordered. Re-assess patient's pain level 30 - 60 minutes after pain management intervention.   Intervention: Administer analgesia as ordered  Note: Toradol IV, Tylenol po and Oxycodone po given for pain control.  Intervention: Assess effectiveness of pain management interventions  Description: Re-assess patient for pain level 30 - 60 minutes after pain management intervention.  Note: Pt. Reporting pain medication is effective.    Pt. Is A&OX4, pleasant & cooperative, requiring 1L 02 via nc to maintain 02 sat greater than 93% at this time, reviewed use of and encouraged hourly use of IS, chest tube remains in place, pt. Sleeping at this time.

## 2021-06-22 NOTE — Plan of Care (Addendum)
Pt discharged home, meds to bed delivered.  Pt and son at bedside for instructions.  Both verbalized understanding of all instructions.  Dressing changed prior to d/c.  Awaiting transport.       Pt alert and oriented x4.  Calm and cooperative with all care.   Pt oob with standby assist.   Chest tube d/c by surgery team.  Pt tolerated procedure with no issues or complications.  Family at bedside to visit.  Ambulating in hall on room air 94%.  SOB with exertion.  Pt using ISE/taking deep breaths as instructed.  LS clear.  Dry dressing to left posterior back.  Pt and family updated on plan of care.  Will c/t support and encourage.     Problem: Tobacco/Nicotine Abuse  Goal: Risk control - tobacco abuse  Description: Actions to eliminate or reduce tobacco use.  Outcome: Progressing     Pt requesting nicotine patch.  Per pt report smokes a pack of cigarettes a day.  Pt ordered and applied to right arm.  Pt educated on correct use and disposal of patches.  Pt verbalized understanding.  Will c/t monitor.

## 2021-06-22 NOTE — Progress Notes (Signed)
[facility] day #: 3       Post-op day #: 2         Dx: LLL lung adeno CA    S/P: VATS, L lower lobectomy    Subjective: C/o pain at L chest tube site, pain with deep breathing.  CT drainage decreased over the past day - only 50cc overnight.      Objective:  Vitals:  Temp:  [98.9 F (37.2 C)-99.5 F (37.5 C)] 98.9 F (37.2 C)  Pulse:  [94-106] 97  Resp:  [20] 20  BP: (137-161)/(66-84) 146/78    Ins/Outs:  480 PO, uop not recorded, 305 from CT (serous this am, 50 overnight)    Continuous Infusions: HLIV    EXAM:  VS: BP 146/78    Pulse 97    Temp 98.9 F (37.2 C) (Oral)    Resp 20    Ht 5\' 1"  (1.549 m)    Wt 65.3 kg (144 lb)    LMP  (LMP Unknown)    SpO2 95%    BMI 27.21 kg/m   GENERAL - A&O, NAD  HEENT - MMM  HEART - RRR, no m,r,g  CHEST - CTA B/L - but decreased at bases, CT in place, serosanguinous drainage on dressing, CT to waterseal and without airleak, fluid in tubing serous  ABDOMEN - soft, NT, ND  EXT- no edema, NT, venodynes    LABS:  Lab Results   Component Value Date    WBC 10.9 06/21/2021    HGB 12.7 06/21/2021    HCT 39.0 06/21/2021    PLTA 130 (L) 06/21/2021       Lab Results   Component Value Date    NA 138 06/21/2021    K 4.4 06/21/2021    CL 102 06/21/2021    CO2 25 06/21/2021    BUN 13 06/21/2021    CREAT 0.7 06/21/2021    TBILI 0.7 06/14/2021    AST 17 06/14/2021    ALT 10 (L) 06/14/2021    ALKPHOS 67 06/14/2021       Lab Results   Component Value Date    PT 11.6 04/27/2021    INR 1.0 04/27/2021    APTT < 26.7 (L) 04/27/2021       Imaging:  CXR 06/21/2021:  IMPRESSION:     A small left apical pneumothorax is unchanged.      Assessment/Plan:  75 year old female smoker with osteoporosis, GAD, GERD, and adenoCA of the LLL - doing well 2 days s/p VATS w/ L lower lobectomy - CXR yesterday with small apical PTX, but CT kept in place to monitor fluid output overnight, which has since decreased.  No airleak this am.  Will plan to remove CT this morning after confirming plan  with Dr. Redmond Pulling.  Will pre-treat with toradol, per pt's request.  Will switch tylenol to standing and continue prn oxycodone.  Once CT removed, will check post-pull film a few hours later and plan to discharge patient home.  F/u CXR and clinic appt arranged at [facility].      Tobe Sos, PA-C, 06/22/2021      Pager:[phone]

## 2021-06-23 ENCOUNTER — Other Ambulatory Visit (HOSPITAL_BASED_OUTPATIENT_CLINIC_OR_DEPARTMENT_OTHER): Payer: Self-pay | Admitting: Family Medicine

## 2021-06-23 DIAGNOSIS — F17200 Nicotine dependence, unspecified, uncomplicated: Secondary | ICD-10-CM

## 2021-06-23 DIAGNOSIS — C3492 Malignant neoplasm of unspecified part of left bronchus or lung: Secondary | ICD-10-CM

## 2021-06-23 NOTE — Progress Notes (Signed)
Cleared by MD for d/c home w/o services. Pt, team, and nursing aware. Driven home by son.     06/22/21 1706   Discharge Reviewed   Reviewed for discharge Yes   UR Completed? Yes   Final Discharge Plan    Was this a  3 day waiver? No   Discharge disposition Home   Home Home no services (01)   Transportation at Discharge Family   Equipment Recommended None   IM from medicare delivered [place] Pending Not applicable   Care Giver   Do you have a caregiver?  No

## 2021-06-23 NOTE — Progress Notes (Signed)
Pt discharged from biopsy with nicotine patches  Received request for smoking cessation support.   Referral placed.    Consider discussion with patient about chantix in the future if additional support needed

## 2021-06-23 NOTE — Discharge Summary (Signed)
Physician Discharge Summary     Patient ID:  Victoria Macdonald  [id number]  75 year old  1946-01-26    Admit date: 06/20/21    Discharge date: 06/22/2021    Admitting Physician: Leonor Liv, MD     Discharge Physician: same    Discharge Diagnoses: adeno CA LLL, s/p L VATS with LLL lobectomy    Admission Condition: good    Discharged Condition: good    Indication for Admission: s/ VATS/lobectomy    Hospital Course: 45F smoker with osteoporosis, GAD, GERD, and adenoCA of the LLL, who was taken to the OR on 06/21/21 by Dr. Redmond Pulling for a L VATS + Left lower lobe lobectomy.  Post-operatively, pt was sent to the floor with a single CT in place, set to waterseal.  Her diet was advanced, her pain medication was adjusted, she was kept on tele with constant O2 monitoring, and she was encouraged to use her IS regularly.  On POD#1, pt was feeling well.  Her CXR showed only a small PTX.  Her CT was left in place as the CT output (serosanguinous) was on the high side, so this was monitored.  On POD #2, the output had decreased, and her CT was removed without issue.  Her post-pull film again showed only a small PTX, unchanged from the day previous.  Her pain was much improved after her CT was removed, allowing for deeper inspirations, and her oxygen was weaned to off.  She was discharged home with pain medication and instructions to follow up with Dr. Redmond Pulling at her [facility] clinic, with a follow up CXR ordered an hour prior to her appointment.  Pt knows to contact the office, or come to the ED if any issues arise prior to her follow up  appointment.    Consults: none    Significant Diagnostic Studies: none    Treatments: IV hydration, antibiotics, analgesia and surgery    Discharge Exam:  VS: BP 146/78    Pulse 97    Temp 98.9 F (37.2 C) (Oral)    Resp 20    Ht 5\' 1"  (1.549 m)    Wt 65.3 kg (144 lb)    LMP  (LMP Unknown)    SpO2 95%    BMI 27.21 kg/m   GENERAL - A&O, NAD  HEENT - MMM  HEART - RRR, no m,r,g  CHEST - CTA B/L - but  decreased at bases, CT in place, serosanguinous drainage on dressing, CT to waterseal and without airleak, fluid in tubing serous  ABDOMEN - soft, NT, ND  EXT- no edema, NT, venodynes    Disposition: home without services    Patient Instructions:   Discharge Medication List as of 06/22/2021  4:07 PM    START taking these medications    acetaminophen (TYLENOL) 325 MG tablet  Take 2 tablets by mouth every 6 (six) hours as needed for Pain  for up to 10 daysOTC, Disp-20 tablet, R-0    ibuprofen (ADVIL) 600 MG tablet  Take 1 tablet by mouth every 6 (six) hours as needed for Pain Take with food.  for up to 10 daysBedside Med Delivery, Disp-20 tablet, R-0    oxyCODONE (ROXICODONE) 5 MG immediate release tablet  Take 1 tablet by mouth every 6 (six) hours as needed for Pain (for pain that is not well controlled with tylenol and/or ibuprofen) Max Daily Amount: 20 mg  for up to 5 daysBedside Med Delivery, Disp-8 tablet, R-0Partial fill upon patient's request.  nicotine (NICODERM CQ) 14 MG/24HR  Place 1 patch onto the skin in the morning.Bedside Med Delivery, Disp-30 patch, R-0    polyethylene glycol (GLYCOLAX/MIRALAX) 17 g packet  Take 1 packet by mouth daily as needed (constipation)  for up to 10 daysBedside Med Delivery, Disp-10 packet, R-0      CONTINUE these medications which have NOT CHANGED    diclofenac sodium 3 % gel  Apply 2 g topically 4 (four) times daily as neededNormal, Disp-300 g, R-3    fexofenadine (ALLEGRA) 180 MG tablet  Take 1 tablet by mouth dailyE-Prescribe, Disp-90 tablet, R-3    fluticasone (FLONASE) 50 MCG/ACT nasal spray  1 spray by Each Nostril route dailyE-Prescribe, Disp-1 each, R-11    montelukast (SINGULAIR) 10 MG tablet  Take 1 tablet by mouth nightly TAKE 1 TABLET BY MOUTH EVERY DAY AT NIGHTE-Prescribe, Disp-90 tablet, R-0    diclofenac (VOLTAREN) 1 % GEL Gel  APPLY 2 G TOPICALLY 4 (FOUR) TIMES DAILY AS NEEDEDE-Prescribe, Disp-300 g, R-3    ergocalciferol (VITAMIN D2) 50000 UNIT capsule  Take 1  capsule by mouth once a week  for 8 dosesE-Prescribe, Disp-8 capsule, R-0    artificial tears (LUBRIFRESH P.M.) OINT  Apply to eyeHistorical      STOP taking these medications    diclofenac (VOLTAREN) 75 MG EC tablet        Activity: no lifting, Driving, or Strenuous exercise until cleared by surgeon  Diet: regular diet  Wound Care: replace dressing as needed if becomes saturated, keep a dressing over wound for 48hrs    [place] Future Appointments:  Current and Future Appointments at [facility] (90 Days) 06/23/2021 - 09/21/2021            None      AT [facility] DUE TO SCHEDULING      Code Status during hospital stay: Full Code      Signed:  Tobe Sos, PA-C  06/23/2021  8:44 AM

## 2021-06-26 ENCOUNTER — Other Ambulatory Visit (HOSPITAL_BASED_OUTPATIENT_CLINIC_OR_DEPARTMENT_OTHER): Payer: Self-pay | Admitting: Family Medicine

## 2021-06-26 DIAGNOSIS — M2550 Pain in unspecified joint: Secondary | ICD-10-CM

## 2021-06-26 NOTE — Telephone Encounter (Signed)
PER Pharmacy, Victoria Macdonald is a 75 year old female has requested a refill of      - Diclofenac (VOLTAREN) 75 MG EC tablet      Last Office Visit: 06/13/21 with [name]  Last Physical Exam: 09/27/17     COLONOSCOPY due on 02/27/2020     Other Med Adult:  Most Recent BP Reading(s)  06/22/21 : 147/68        Cholesterol (mg/dL)   Date Value   04/27/2021 207     LOW DENSITY LIPOPROTEIN DIRECT (mg/dL)   Date Value   04/27/2021 157     HIGH DENSITY LIPOPROTEIN (mg/dL)   Date Value   04/27/2021 38 (L)     TRIGLYCERIDES (mg/dL)   Date Value   04/27/2021 99         THYROID SCREEN TSH REFLEX FT4 (uIU/mL)   Date Value   02/12/2020 4.110 (H)         No results found for: TSH    No results found for: HGBA1C    No results found for: POCA1C      INR (no units)   Date Value   04/27/2021 1.0   04/30/2018 1.0       SODIUM (mmol/L)   Date Value   06/21/2021 138       POTASSIUM (mmol/L)   Date Value   06/21/2021 4.4           CREATININE (mg/dL)   Date Value   06/21/2021 0.7        Documented patient preferred pharmacies:    [id number] [place] [place]  Phone: [phone] Fax: [place], [place] - [address]  Phone: [phone] Fax: [phone]    [facility] [address] [place], [place], [place] - [place]  Phone: [phone] Fax: [phone]

## 2021-06-28 ENCOUNTER — Encounter (HOSPITAL_BASED_OUTPATIENT_CLINIC_OR_DEPARTMENT_OTHER): Payer: Self-pay | Admitting: Family Medicine

## 2021-06-28 NOTE — Telephone Encounter (Signed)
Sent message to clarify diclofenac vs ibuprofen use.

## 2021-07-02 ENCOUNTER — Other Ambulatory Visit (HOSPITAL_BASED_OUTPATIENT_CLINIC_OR_DEPARTMENT_OTHER): Payer: Self-pay | Admitting: Family Medicine

## 2021-07-02 DIAGNOSIS — R0981 Nasal congestion: Secondary | ICD-10-CM

## 2021-07-02 NOTE — Telephone Encounter (Signed)
PER Pharmacy, Victoria Macdonald is a 75 year old female has requested a refill of      -  singulair        Last Office Visit: 03/02/21 with d'agata  Last Physical Exam: 09/27/17     COLONOSCOPY due on 02/27/2020     Other Med Adult:  Most Recent BP Reading(s)  06/22/21 : 147/68        Cholesterol (mg/dL)   Date Value   04/27/2021 207     LOW DENSITY LIPOPROTEIN DIRECT (mg/dL)   Date Value   04/27/2021 157     HIGH DENSITY LIPOPROTEIN (mg/dL)   Date Value   04/27/2021 38 (L)     TRIGLYCERIDES (mg/dL)   Date Value   04/27/2021 99         THYROID SCREEN TSH REFLEX FT4 (uIU/mL)   Date Value   02/12/2020 4.110 (H)         No results found for: TSH    No results found for: HGBA1C    No results found for: POCA1C      INR (no units)   Date Value   04/27/2021 1.0   04/30/2018 1.0       SODIUM (mmol/L)   Date Value   06/21/2021 138       POTASSIUM (mmol/L)   Date Value   06/21/2021 4.4           CREATININE (mg/dL)   Date Value   06/21/2021 0.7        Documented patient preferred pharmacies:    [id number] [place], [place] - [place]  Phone: [phone] Fax: [phone]    [facility] [place], [place] - [address]  Phone: [phone] Fax: [phone]    [facility] [address] [place], [place], [place] - [place]  Phone: [phone] Fax: [phone]

## 2021-07-05 ENCOUNTER — Encounter (HOSPITAL_BASED_OUTPATIENT_CLINIC_OR_DEPARTMENT_OTHER): Payer: Self-pay | Admitting: Family Medicine

## 2021-07-05 DIAGNOSIS — J310 Chronic rhinitis: Secondary | ICD-10-CM

## 2021-07-06 ENCOUNTER — Other Ambulatory Visit (HOSPITAL_BASED_OUTPATIENT_CLINIC_OR_DEPARTMENT_OTHER): Payer: Self-pay | Admitting: Surgical

## 2021-07-06 MED ORDER — IBUPROFEN 600 MG PO TABS
600.00 mg | ORAL_TABLET | Freq: Four times a day (QID) | ORAL | 0 refills | Status: AC | PRN
Start: 2021-07-06 — End: 2021-07-20

## 2021-07-06 MED ORDER — FLUTICASONE PROPIONATE 50 MCG/ACT NA SUSP
1.0000 | Freq: Every day | NASAL | 11 refills | Status: DC
Start: 2021-07-06 — End: 2022-07-07

## 2021-07-06 NOTE — Telephone Encounter (Signed)
PER Patient (self), Victoria Macdonald is a 75 year old female has requested a refill of      -  Fluticasone (Flonase) 50 mcg/act Nasal Spray       Last Office Visit: 03/02/21 with Fredirick Lathe  Last Physical Exam: 09/27/17     COLONOSCOPY due on 02/27/2020     Other Med Adult:  Most Recent BP Reading(s)  06/22/21 : 147/68        Cholesterol (mg/dL)   Date Value   04/27/2021 207     LOW DENSITY LIPOPROTEIN DIRECT (mg/dL)   Date Value   04/27/2021 157     HIGH DENSITY LIPOPROTEIN (mg/dL)   Date Value   04/27/2021 38 (L)     TRIGLYCERIDES (mg/dL)   Date Value   04/27/2021 99         THYROID SCREEN TSH REFLEX FT4 (uIU/mL)   Date Value   02/12/2020 4.110 (H)         No results found for: TSH    No results found for: HGBA1C    No results found for: POCA1C      INR (no units)   Date Value   04/27/2021 1.0   04/30/2018 1.0       SODIUM (mmol/L)   Date Value   06/21/2021 138       POTASSIUM (mmol/L)   Date Value   06/21/2021 4.4           CREATININE (mg/dL)   Date Value   06/21/2021 0.7        Documented patient preferred pharmacies:    [id number], [place] - [place]  Phone: [phone] Fax: [phone]

## 2021-07-13 LAB — SURGICAL PATH SPECIMEN LUNG

## 2021-07-20 ENCOUNTER — Telehealth (HOSPITAL_BASED_OUTPATIENT_CLINIC_OR_DEPARTMENT_OTHER): Payer: Self-pay | Admitting: Registered Nurse

## 2021-07-20 ENCOUNTER — Other Ambulatory Visit (HOSPITAL_BASED_OUTPATIENT_CLINIC_OR_DEPARTMENT_OTHER): Payer: Self-pay | Admitting: Family Medicine

## 2021-07-20 NOTE — Telephone Encounter (Signed)
Please see request

## 2021-07-20 NOTE — Telephone Encounter (Signed)
-----   Message from Ludwig Clarks sent at 07/15/2021  1:32 PM EDT -----  Regarding: [place]: [phone]  Victoria Macdonald [id number], 75 year old, female    Calls today:  Clinical Questions ([place])    Name of person calling [place] by insurance  Specific nature of request calling the prescription to be fax over please [place] for more info.  Return phone number [phone]  [phone]  Person calling on behalf of patient: Jeronimo Greaves NUMBER: [phone]    Patient's language of care: English    Patient does not need an interpreter.    Patient's PCP: Fredirick Lathe, MD

## 2021-07-20 NOTE — Telephone Encounter (Signed)
Called to inquire about RX needed .

## 2021-07-20 NOTE — Telephone Encounter (Signed)
-----   Message from [place] Degree sent at 07/18/2021  1:18 PM EDT -----  Regarding: URGENT/ 2ND Attempt/ Requesting DME Rx/Call Back  Contact: [phone]  Victoria Macdonald [id number], 75 year old, female    Calls today:  Clinical Questions ([place])    Name of person calling  [name] (Nurse Case Manager) of [employer]    Specific nature of request URGENT/2ND ATTEMPT/ [name] called back in August requesting a Rx for a Civil engineer, contracting for the pt and never received the Rx via fax. Please send Rx to fax number [phone] Attn: [facility]. Please call [name] back to confirm, Thank You.    Return phone number [phone]  Person calling on behalf of patient:  [name] (Nurse Case Manager) of [employer]    Patient's language of care: English    Patient does not need an interpreter.    Patient's PCP: Fredirick Lathe, MD

## 2021-07-25 ENCOUNTER — Telehealth (HOSPITAL_BASED_OUTPATIENT_CLINIC_OR_DEPARTMENT_OTHER): Payer: Self-pay | Admitting: Thoracic Surgery (Cardiothoracic Vascular Surgery)

## 2021-07-25 ENCOUNTER — Encounter (HOSPITAL_BASED_OUTPATIENT_CLINIC_OR_DEPARTMENT_OTHER): Payer: Self-pay

## 2021-07-25 ENCOUNTER — Telehealth (HOSPITAL_BASED_OUTPATIENT_CLINIC_OR_DEPARTMENT_OTHER): Payer: Self-pay

## 2021-07-25 LAB — TB CULT AND SMEAR (STATE LAB): TB SMEAR STATE LAB: NONE SEEN

## 2021-07-25 LAB — AFB SMEAR: SMEAR, FLUORESCENT/ACID STAI: NEGATIVE

## 2021-07-25 MED ORDER — OTHER MEDICATION
0 refills | Status: AC
Start: 2021-07-25 — End: 2022-07-25

## 2021-07-25 NOTE — Telephone Encounter (Signed)
Hello,  [facility] received a request for [employer]. In order to process this request we will first need an RX. I have pended an INCOMPLETE RX below. Please review, complete and approve if appropriate. Please be sure to include DX and ICD 10.      Please review.      Thank you

## 2021-07-25 NOTE — Telephone Encounter (Addendum)
Pt calling today sating old surgical site ? Infection leaking serosanguinous fluid. Denies Fevers -  no clinic appointments at [facility] called BI was able to get patient an appointment at 0930 tomorrow morning with Dr. Redmond Pulling to be evaluated.  Pt confirms she will attend the appointment.      ----- Message from Murlean Iba sent at 07/25/2021  3:21 PM EDT -----  Regarding: Left lower lung draining fluid.  [place] [id number], 75 year old, female, Telephone Information:  Home Phone      [phone]  Work Phone      Not on file.  Mobile          [phone]      Patient's Preferred Pharmacy:     [id number] [place], [place]  Phone: [phone] Fax: [phone]    [facility] [place], [place]  Phone: [phone] Fax: [phone]     [address] [place], [place], [place]  Phone: [phone] Fax: [phone]        [place] NUMBER: [phone]  Best time to call back: today    Patient's language of care: English    Patient does not need an interpreter.    Patient's PCP: Fredirick Lathe, MD    Person calling on behalf of patient: Patient (self)    Called today requesting to speak to Dr Redmond Pulling or Rn about left lower lung Lobectomy surgery on 06/20/21, patient is very concern about draining fluid.

## 2021-07-29 ENCOUNTER — Encounter (HOSPITAL_BASED_OUTPATIENT_CLINIC_OR_DEPARTMENT_OTHER): Payer: Self-pay | Admitting: Family Medicine

## 2021-07-29 NOTE — Telephone Encounter (Signed)
Hello,    [facility] received an order for Shower Chair    This has been forwarded to [employer] and scanned into Environmental health practitioner. Due to COVID-19 [facility] is using provider's signature on file to submit DME requests.    Please note DME requests can take 4-6 weeks to receive a decision.     Thanks!

## 2021-08-03 ENCOUNTER — Telehealth (HOSPITAL_BASED_OUTPATIENT_CLINIC_OR_DEPARTMENT_OTHER): Payer: Self-pay

## 2021-08-03 NOTE — Telephone Encounter (Signed)
TTP 08/03/2021 Patient reached. Eileen smoked for close to 60 years, up to one ppd. She was smoking between 8-10 CPD when she quit on 06/19/2021, She quit before her surgery.      Delonna is still experiencing some cravings and using the 14 mg patch intermittently. It is helping.     F/U: Literature on staying quit, info on lozenge, gum and Inhaler for harm reduction. F/u call in 2 weeks.    Mellody Dance, [place]  [phone]  [email or web address]

## 2021-08-05 ENCOUNTER — Telehealth (HOSPITAL_BASED_OUTPATIENT_CLINIC_OR_DEPARTMENT_OTHER): Payer: Self-pay | Admitting: Registered Nurse

## 2021-08-05 NOTE — Telephone Encounter (Signed)
-----   Message from Wynelle Link sent at 08/03/2021 10:34 AM EDT -----  Contact: [place] [id number], 75 year old, female    Calls today:  Clinical Questions ([place])    Name of person calling LINDA, [place]    Specific nature of request [place] REQUESTED, LINDA BELIEVES WE HAVE SENT IT TO THE PHARMACY BUT [facility]. PLEASE FAX PRESCRIPTION TO [place] TO FAX NUMBER [place]    Return phone number [phone]  Person calling on behalf of patient: [place] NUMBER: [phone]  Best time to call back: ANYTIME  Cell phone:   Other phone:    Patient's language of care: English    Patient does not need an interpreter.    Patient's PCP: Fredirick Lathe, MD

## 2021-08-24 ENCOUNTER — Ambulatory Visit: Payer: No Typology Code available for payment source | Attending: Family Medicine | Admitting: Family Medicine

## 2021-08-24 ENCOUNTER — Encounter (HOSPITAL_BASED_OUTPATIENT_CLINIC_OR_DEPARTMENT_OTHER): Payer: Self-pay

## 2021-08-24 ENCOUNTER — Encounter (HOSPITAL_BASED_OUTPATIENT_CLINIC_OR_DEPARTMENT_OTHER): Payer: Self-pay | Admitting: Family Medicine

## 2021-08-24 DIAGNOSIS — F4323 Adjustment disorder with mixed anxiety and depressed mood: Secondary | ICD-10-CM | POA: Diagnosis present

## 2021-08-24 DIAGNOSIS — F320 Major depressive disorder, single episode, mild: Secondary | ICD-10-CM | POA: Insufficient documentation

## 2021-08-24 MED ORDER — CITALOPRAM HYDROBROMIDE 10 MG PO TABS
10.0000 mg | ORAL_TABLET | Freq: Every day | ORAL | 2 refills | Status: DC
Start: 2021-08-24 — End: 2021-10-18

## 2021-08-24 MED ORDER — HYDROXYZINE HCL 10 MG PO TABS
10.00 mg | ORAL_TABLET | Freq: Three times a day (TID) | ORAL | 1 refills | Status: AC | PRN
Start: 2021-08-24 — End: 2021-10-23

## 2021-08-24 NOTE — Progress Notes (Signed)
TTP 08/24/2021 F/U         Victoria Macdonald would like some 7 mg patches, as the 14mg s are too strong now.        F/U; note to PCP      Mellody Dance, [place]  [phone]  [email or web address]

## 2021-08-24 NOTE — Progress Notes (Signed)
SUBJECTIVE:  Victoria Macdonald is a 75 year old female who presents Patient presents with:  Anxiety    Video visit     #) Mood/Anxiety   - Very bad lately   - Ongoing since recent biopsy and Surgery (~3 months)  - Exacerbated by pain related to recent vats/lobectomy  - Unable to sleep well  - Low appetite, doesn't feel like eating   - At times makes her feel panicked (SOB, mild palpitation)   - Also working to stop smoking.  Had 2 months without smoking, more recently 1 cigarette over 2-3 days.  Started using patches recently but felt like made her anxiety worse so stopped.       - Tried herbal teas   - H/o depression; Previously on Citalopram, felt like medication helped her a lot.  Did give her reflux but also had h pylori at the time.    - Lives alone, a bit isolated.  Feels a bit better when she is able to get out of the home.      ROS: see HPI     Medications, Allergies, FHx and Social Hx reviewed in Epic and current.      MEDICATIONS:  Current Outpatient Medications   Medication Sig    OTHER MEDICATION Shower Chair  For use as directed.    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    montelukast (SINGULAIR) 10 MG tablet TAKE 1 TABLET BY MOUTH NIGHTLY TAKE 1 TABLET BY MOUTH EVERY DAY AT NIGHT    diclofenac sodium 3 % gel Apply 2 g topically 4 (four) times daily as needed    fexofenadine (ALLEGRA) 180 MG tablet Take 1 tablet by mouth daily    diclofenac (VOLTAREN) 1 % GEL Gel APPLY 2 G TOPICALLY 4 (FOUR) TIMES DAILY AS NEEDED    ergocalciferol (VITAMIN D2) 50000 UNIT capsule Take 1 capsule by mouth once a week  for 8 doses    artificial tears (LUBRIFRESH P.M.) OINT Apply to eye     No current facility-administered medications for this visit.       ALLERGIES:  Review of Patient's Allergies indicates:   Lactose intolerance*    Other (See Comments)    Comment:bloating    EXAM:  Gen: NAD    Pulm: Speaking in full sentences, no cough   Psych: Alert.  Speech clear and fluent.  Normal rate and tone.  Anxious mood with congruent affect.       ASSESSMENT/PLAN:  Victoria Macdonald is a 75 year old female who presents for     1. Adjustment disorder with mixed anxiety and depressed mood - H/o mild depression with return of depressive and anxious symptoms with panic following recent lung biopsy followed by VATS/lobectomy.  Symptoms exacerbated in the setting of nicotine patch use and ongoing pain following recent surgery.  Reports good benefit in past to Citalopram and would like to restart.  Will also provide with low dose hydroxyzine for PRN use for panic symptoms.  - Patient has already d/c'd nicotine patches   - Start: hydrOXYzine (ATARAX) 10 MG tablet; Take 1 tablet by mouth 3 (three) times daily as needed for Anxiety  Dispense: 30 tablet; Refill: 1  - Start: citalopram (CELEXA) 10 MG tablet; Take 1 tablet by mouth in the morning.  Dispense: 30 tablet; Refill: 2  - If no  benefit from citalopram consider trial of Mirtazapine (given decreased appetite, sleep difficulty) vs bupriopion (smoking cessation)   - Close follow up with PCP    We discussed Citalopram and the importance of medication compliance. The patient was ready to learn and no apparent learning barriers were identified. I explained the diagnosis and treatment plan, and the patient expressed understanding of the content. Possible side effects of the prescribed medication(s) were explained, including GI upset, headache, drowsiness, insomnia, changes in mood/thinking.  I attempted to answer any questions regarding the diagnosis and the proposed treatment.    Follow up: 4 weeks with PCP    _____________________    1. The patient indicates understanding of these issues and agrees with the plan.  2. I reviewed the patient's medical information and medical history   3.  I reconciled the patient's medication list and prepared and supplied needed  refills.  4.  I have reviewed the past medical, family, and social history sections including the medications and allergies listed in the above medical record      Oralia Rud, MD, 08/24/2021

## 2021-08-25 ENCOUNTER — Encounter (HOSPITAL_BASED_OUTPATIENT_CLINIC_OR_DEPARTMENT_OTHER): Payer: Self-pay | Admitting: Family Medicine

## 2021-08-25 ENCOUNTER — Other Ambulatory Visit (HOSPITAL_BASED_OUTPATIENT_CLINIC_OR_DEPARTMENT_OTHER): Payer: Self-pay | Admitting: Family Medicine

## 2021-08-25 MED ORDER — NICOTINE 7 MG/24HR TD PT24
1.00 | MEDICATED_PATCH | TRANSDERMAL | 0 refills | Status: AC
Start: 2021-08-25 — End: 2021-09-24

## 2021-08-26 ENCOUNTER — Ambulatory Visit (HOSPITAL_BASED_OUTPATIENT_CLINIC_OR_DEPARTMENT_OTHER): Payer: No Typology Code available for payment source | Admitting: Family Medicine

## 2021-08-27 LAB — RESPIRATORY PANEL BASIC CARE EVERYWHERE
COVID-19 CARE EVERYWHERE: NOT DETECTED
INFLUENZA A PCR CARE EVERYWHERE: DETECTED — AB
INFLUENZA B PCR CARE EVERYWHERE: NOT DETECTED
RSV PCR CARE EVERYWHERE: NOT DETECTED

## 2021-08-28 ENCOUNTER — Encounter (HOSPITAL_BASED_OUTPATIENT_CLINIC_OR_DEPARTMENT_OTHER): Payer: Self-pay | Admitting: Family Medicine

## 2021-08-28 ENCOUNTER — Encounter (HOSPITAL_BASED_OUTPATIENT_CLINIC_OR_DEPARTMENT_OTHER): Payer: Self-pay | Admitting: Thoracic Surgery (Cardiothoracic Vascular Surgery)

## 2021-08-31 ENCOUNTER — Ambulatory Visit
Payer: No Typology Code available for payment source | Attending: Thoracic Surgery (Cardiothoracic Vascular Surgery) | Admitting: Thoracic Surgery (Cardiothoracic Vascular Surgery)

## 2021-08-31 DIAGNOSIS — C3432 Malignant neoplasm of lower lobe, left bronchus or lung: Secondary | ICD-10-CM

## 2021-08-31 NOTE — Progress Notes (Signed)
THORACIC SURGERY CLINIC FOLLOW UP    CC: pneumonia and pain    Status post VATS LLLy at [facility] in Jun 20, 2021 for a growing LLL 2cm  nodule, final path [id number], stage I    HPI:  52F with a history of lung cancer as above that recently    had a lot of pain in her thorax and a fever and pain that did 'not stop; so she went to the ER and was diagnosed with pneumonia on Saturday in [place]. She was given an antibiotic doxycycline and tamiflu. She notes that she is coughing a lot, but overall feels better. She was afraid to try cough medicine but I reassured her that she could try some. No more fevers. She is taking tylenol for pain and that is sufficient.    New Imaging: none    A/P:    Pt is a 75 year old female  With a history of lung cancer as above, recent diagnosis of pneumonia better on doxy and tamiflu.    CT chest w contrast in March at [facility] visit after    Valinda Party, MD  Thoracic Surgery

## 2021-09-05 ENCOUNTER — Other Ambulatory Visit (HOSPITAL_BASED_OUTPATIENT_CLINIC_OR_DEPARTMENT_OTHER): Payer: Self-pay | Admitting: Thoracic Surgery (Cardiothoracic Vascular Surgery)

## 2021-09-05 DIAGNOSIS — C3432 Malignant neoplasm of lower lobe, left bronchus or lung: Secondary | ICD-10-CM

## 2021-09-27 ENCOUNTER — Other Ambulatory Visit (HOSPITAL_BASED_OUTPATIENT_CLINIC_OR_DEPARTMENT_OTHER): Payer: Self-pay | Admitting: Family Medicine

## 2021-09-27 DIAGNOSIS — R0981 Nasal congestion: Secondary | ICD-10-CM

## 2021-09-27 NOTE — Telephone Encounter (Signed)
PER Pharmacy, Victoria Macdonald is a 75 year old female has requested a refill of      -  montelukast        Last Office Visit: 08/24/21 with pcp  Last Physical Exam: 09/27/17      COLONOSCOPY due on 02/27/2020     Other Med Adult:  Most Recent BP Reading(s)  06/22/21 : 147/68        Cholesterol (mg/dL)   Date Value   04/27/2021 207     LOW DENSITY LIPOPROTEIN DIRECT (mg/dL)   Date Value   04/27/2021 157     HIGH DENSITY LIPOPROTEIN (mg/dL)   Date Value   04/27/2021 38 (L)     TRIGLYCERIDES (mg/dL)   Date Value   04/27/2021 99         THYROID SCREEN TSH REFLEX FT4 (uIU/mL)   Date Value   02/12/2020 4.110 (H)         No results found for: TSH    No results found for: HGBA1C    No results found for: POCA1C      INR (no units)   Date Value   04/27/2021 1.0   04/30/2018 1.0       SODIUM (mmol/L)   Date Value   06/21/2021 138       POTASSIUM (mmol/L)   Date Value   06/21/2021 4.4           CREATININE (mg/dL)   Date Value   06/21/2021 0.7        Documented patient preferred pharmacies:    [id number], [place] - [place]  Phone: [phone] Fax: [phone]    [facility] [place], [place] - [address]  Phone: [phone] Fax: [phone]    [facility] [address] [place], [place], [place] - [place]  Phone: [phone] Fax: [phone]

## 2021-10-03 ENCOUNTER — Encounter (HOSPITAL_BASED_OUTPATIENT_CLINIC_OR_DEPARTMENT_OTHER): Payer: Self-pay | Admitting: Family Medicine

## 2021-10-03 ENCOUNTER — Ambulatory Visit (HOSPITAL_BASED_OUTPATIENT_CLINIC_OR_DEPARTMENT_OTHER): Payer: No Typology Code available for payment source | Admitting: Family Medicine

## 2021-10-03 ENCOUNTER — Ambulatory Visit
Admission: RE | Admit: 2021-10-03 | Discharge: 2021-10-03 | Disposition: A | Discharge status: Home / Self Care | Payer: No Typology Code available for payment source | Attending: Family Medicine | Admitting: Family Medicine

## 2021-10-03 ENCOUNTER — Other Ambulatory Visit: Payer: Self-pay

## 2021-10-03 VITALS — BP 130/60 | HR 94 | Temp 98.3°F | Wt 136.0 lb

## 2021-10-03 DIAGNOSIS — M25562 Pain in left knee: Secondary | ICD-10-CM

## 2021-10-03 DIAGNOSIS — J90 Pleural effusion, not elsewhere classified: Secondary | ICD-10-CM | POA: Insufficient documentation

## 2021-10-03 DIAGNOSIS — R0789 Other chest pain: Secondary | ICD-10-CM

## 2021-10-03 DIAGNOSIS — D696 Thrombocytopenia, unspecified: Secondary | ICD-10-CM

## 2021-10-03 DIAGNOSIS — F320 Major depressive disorder, single episode, mild: Secondary | ICD-10-CM | POA: Diagnosis not present

## 2021-10-03 DIAGNOSIS — R918 Other nonspecific abnormal finding of lung field: Secondary | ICD-10-CM | POA: Diagnosis present

## 2021-10-03 DIAGNOSIS — M25552 Pain in left hip: Secondary | ICD-10-CM | POA: Diagnosis not present

## 2021-10-03 DIAGNOSIS — R079 Chest pain, unspecified: Secondary | ICD-10-CM | POA: Diagnosis not present

## 2021-10-03 DIAGNOSIS — R0781 Pleurodynia: Secondary | ICD-10-CM

## 2021-10-03 DIAGNOSIS — G8929 Other chronic pain: Secondary | ICD-10-CM

## 2021-10-03 DIAGNOSIS — F17200 Nicotine dependence, unspecified, uncomplicated: Secondary | ICD-10-CM

## 2021-10-03 IMAGING — MR RM - [facility]
3 of 6 series · 9 of 48 positions shown · non-contrast
Comparison: none

[Series 301: T2 · sagittal · 4.0mm · 0.38mm/px · 3 of 30 slices shown (1 of 3)]
[im 3/30]
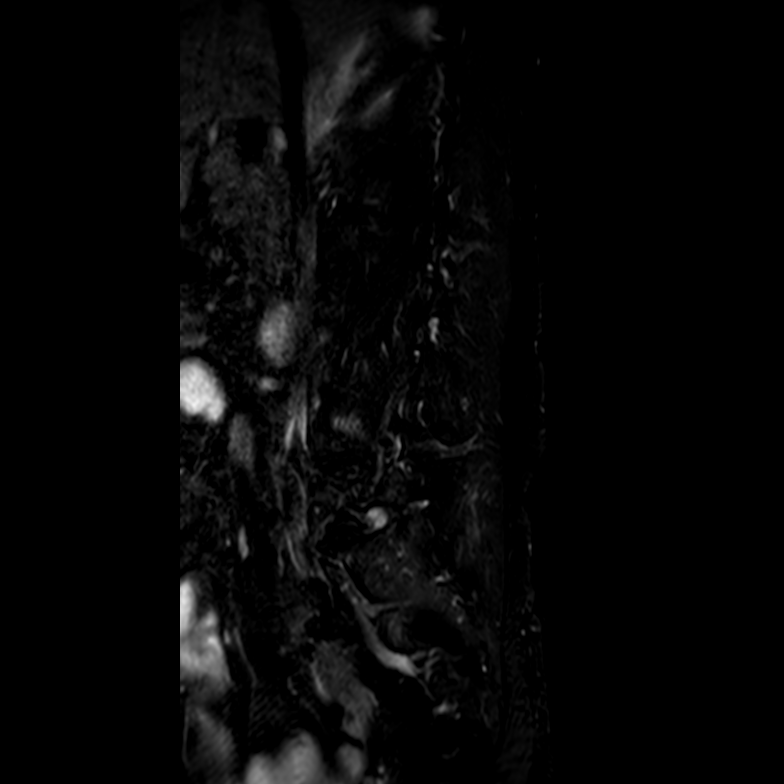
[im 15/30]
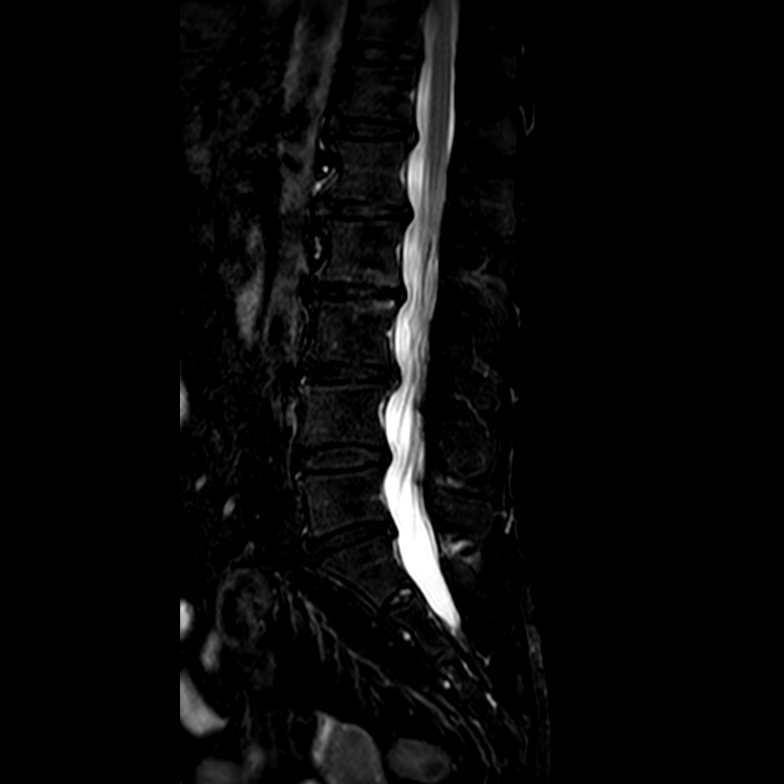
[im 27/30]
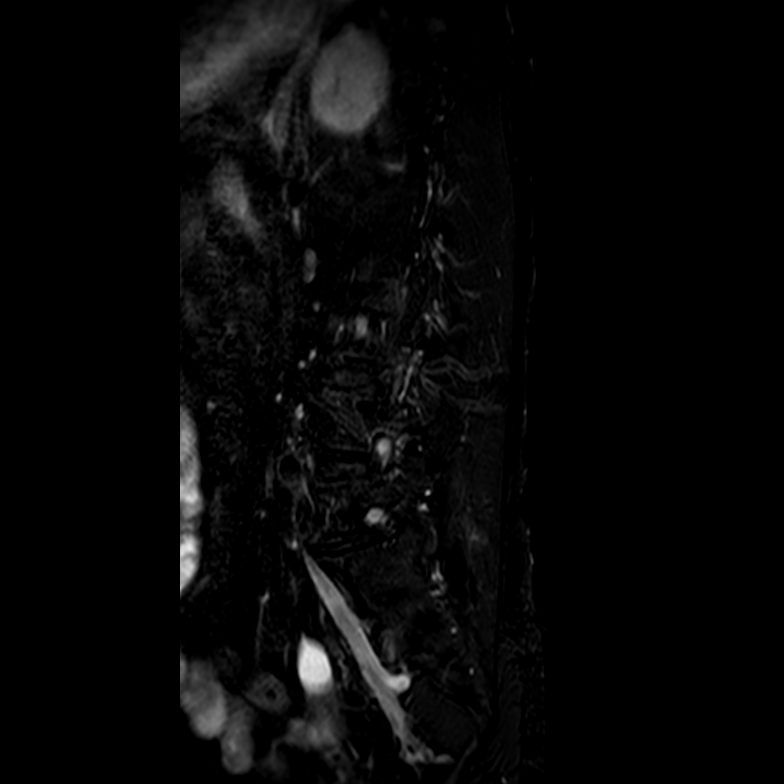

[Series 302: T2 · sagittal · 4.0mm · 0.38mm/px · 3 of 15 slices shown (2 of 3)]
[im 1/15]
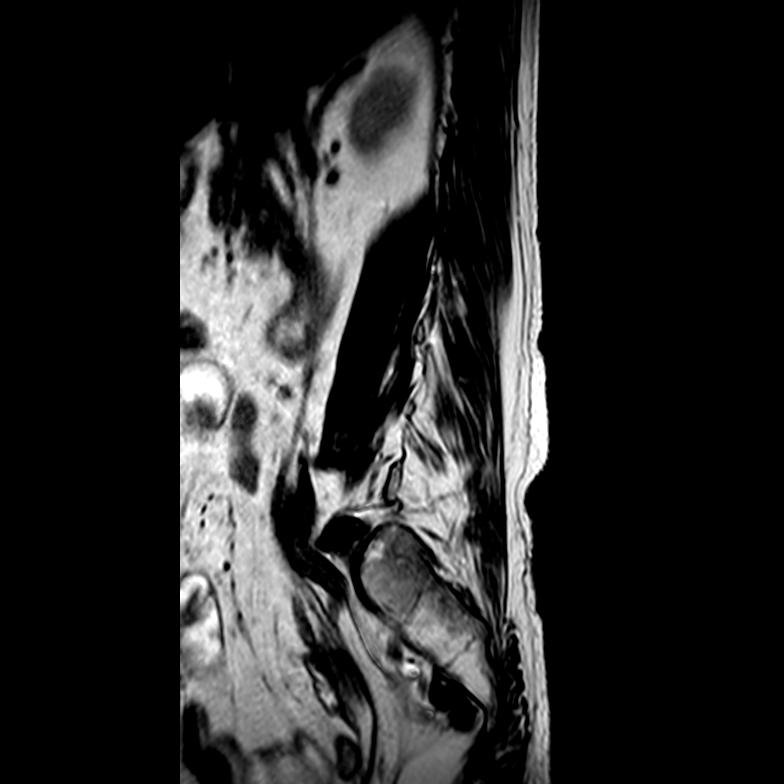
[im 8/15]
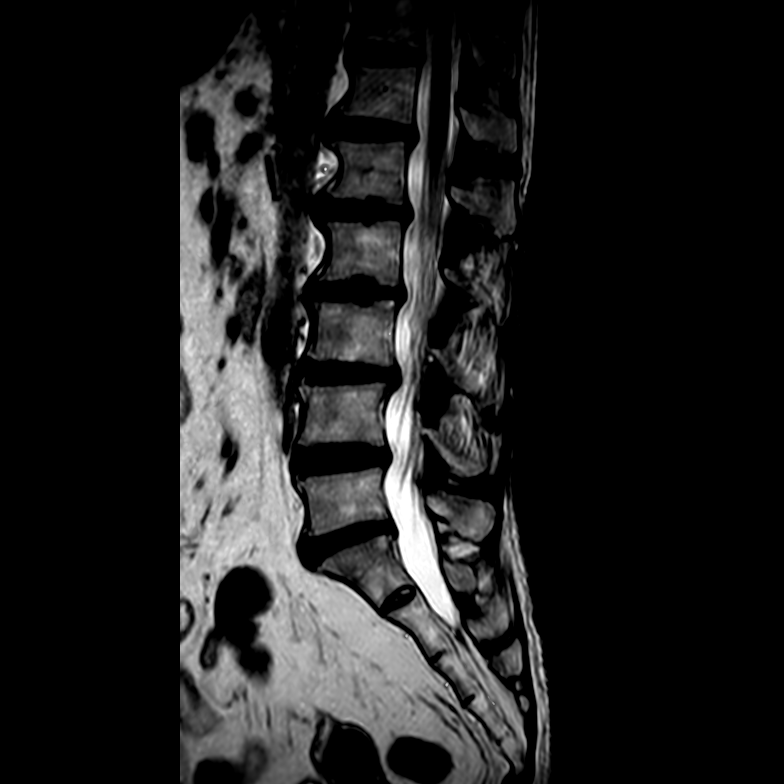
[im 15/15]
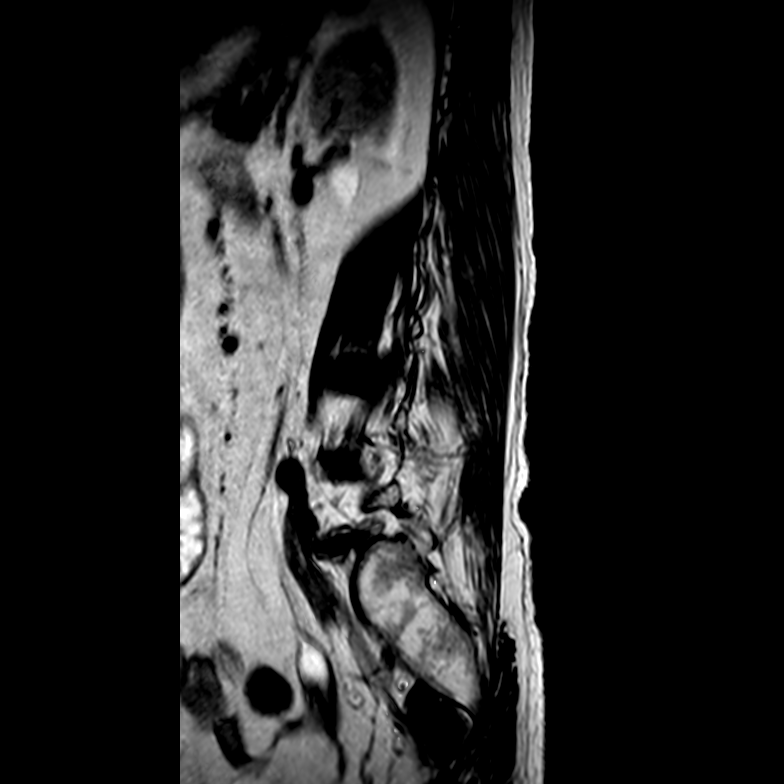

[Series 303: T2 · sagittal · 4.0mm · 0.38mm/px · 3 of 15 slices shown (3 of 3)]
[im 1/15]
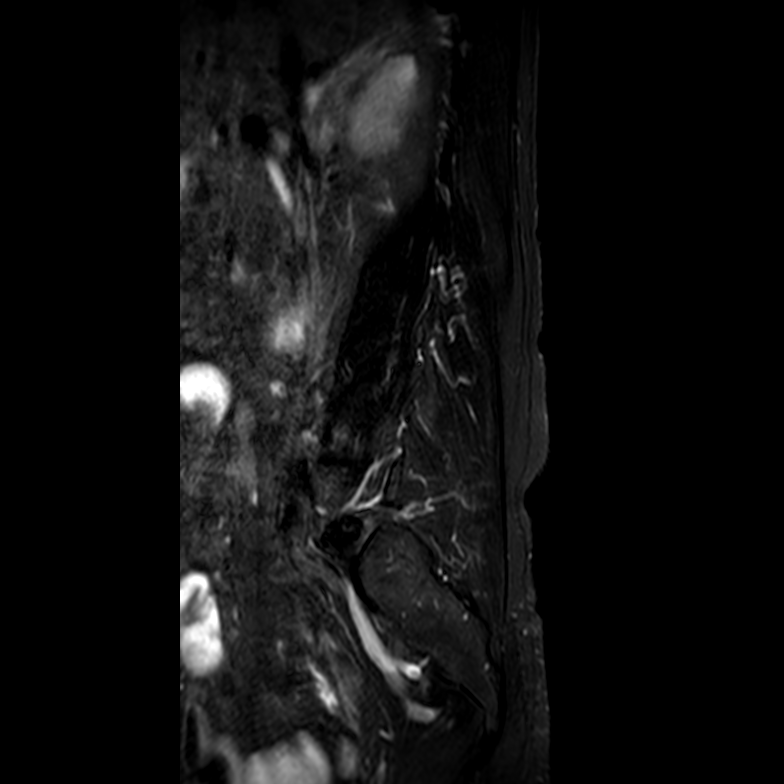
[im 8/15]
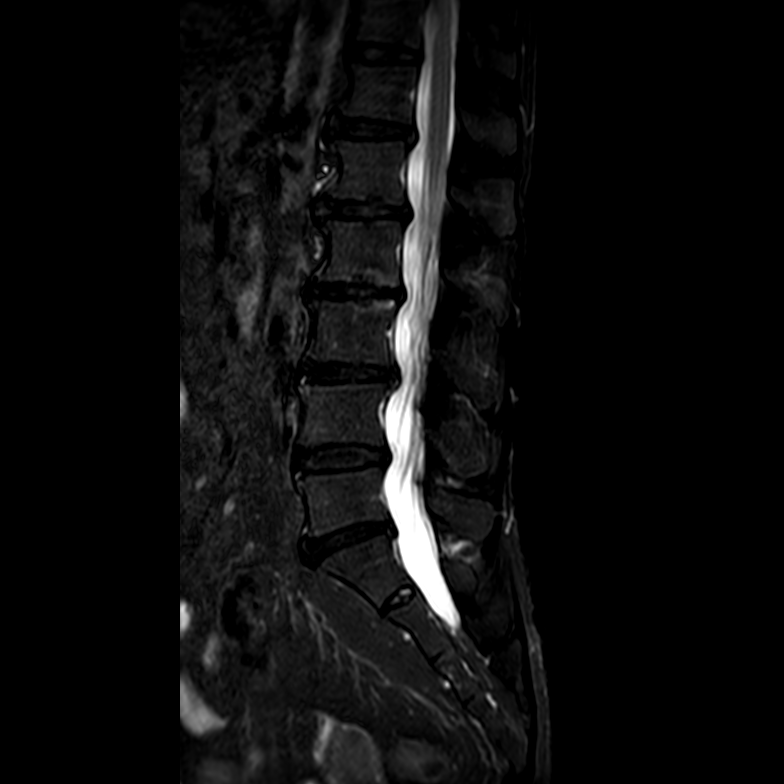
[im 15/15]
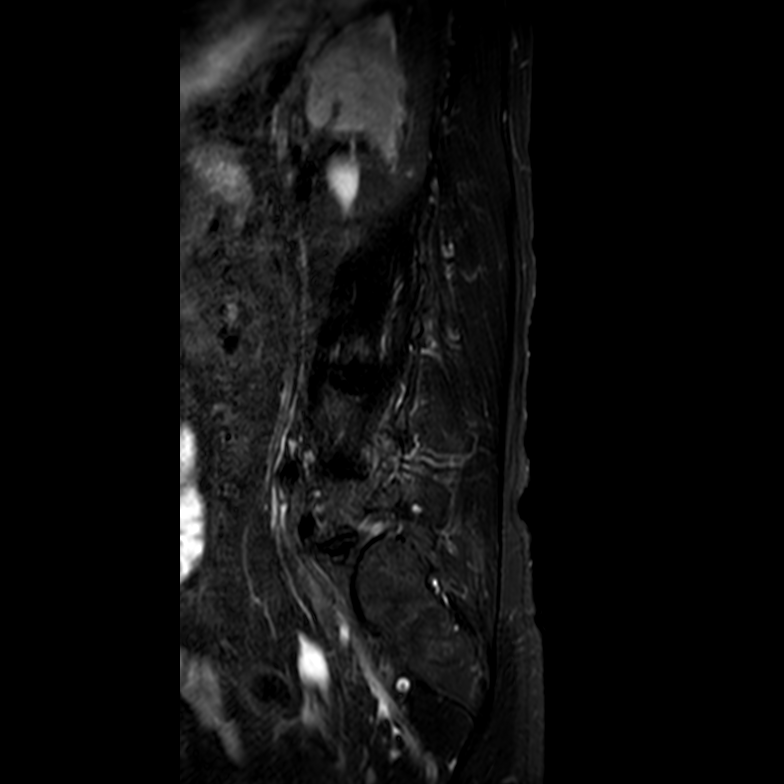

[9 of 48 positions shown; findings below may reference images not displayed]

RESSONÂNCIA MAGNÉTICA DE COLUNA LOMBAR

TÉCNICA:
Exame realizado em equipamento de ressonância magnética com sequências, ponderações e planos específicos para o segmento de interesse.

RESULTADO:
Bom alinhamento dos corpos vertebrais lombares.
Corpos vertebrais com alturas preservadas.
Osteofitose nos corpos vertebrais lombares.
Pedículos, laminas, processos transversos e espinhosos preservados.
Artrose interfacetária difusa.
Focos de hipersinal em T1 e T2 esparsos pelos corpos vertebrais  podendo corresponder a hemangiomas ou ainda deposições gordurosas focais.
Nódulos de Schmorl esparsos pelos planaltos vertebrais.
Sinais de desidratação discal difusa.

Nível L1-L2: Abaulamento discal simétrico , retificando a face ventral do saco dural.
Nível L2-L3: Abaulamento discal simétrico , retificando a face ventral do saco dural.
Nível L3-L4: Abaulamento discal assimétrico com maior componente à direita, retificando a face ventral do saco dural e tocando a raiz emergente direita de L3.
Nível L4-L5: Abaulamento discal simétrico , retificando a face ventral do saco dural.
Nível L5-S1: Abaulamento discal simétrico , retificando a face ventral do saco dural e reduzindo as amplitudes foraminais, tocando as raízes emergentes de L5.

Demais discos intervertebrais sem alterações significativas.
Canal vertebral e forames intervertebrais com amplitude preservada nos demais segmentos.
Espaço liquórico livre nos demais segmentos.
Cone medular com contornos regulares e sinal homogêneo.
Parcial lipossubstituição da musculatura paravertebral posterior.

CONCLUSÃO:
Espondiloartropatia degenerativa lombar.
Discopatia degenerativa acima descrita.

## 2021-10-03 MED ORDER — NICOTINE 10 MG IN INHA
1.00 | RESPIRATORY_TRACT | 0 refills | Status: AC | PRN
Start: 2021-10-03 — End: 2021-12-02

## 2021-10-03 NOTE — Progress Notes (Signed)
Family Medicine Office Note    CC:  Patient presents with:  Follow Up      HPI:    #s/p resection lung cancer  Does not feel like eating or doing thing  Had pneumonia in November  Afraid of everything - worried abotu getting sick  Last CXR- in August with pleural effusion and small apical pneumothorax  Still having constant L rib and side pain  ALso having R shoulder pain from sleeping on that side  Was having someone coming daily to the home- 10h a week- laundry- change to 3h on Tuesday  Has not had at PT or other treatment for pain  Had open wound but resolved with amoxicillin     #L hip pain  Dx with greater trochanter bursitis in 2021  Injection helped     Social/PMH/FHx:   Tobacco use- stopped. With discomfort and stress has smoked max of 1 cigaretter per day. Palpitations/anxiety with patch    EXAM:  BP 130/60    Pulse 94    Temp 98.3 F (36.8 C) (Temporal)    Wt 61.7 kg (136 lb)    LMP  (LMP Unknown)    SpO2 96%    BMI 25.70 kg/m   GEN: Well appearing, NAD  CARD: S1 and S2 normal, no murmurs, clicks, gallops or rubs. Regular rate and rhythm.  CHEST: Chest is clear, no wheezing or rales. Normal symmetric air entry throughout both lung fields. TTP and slight firmness of L later ribs between well healed surgical scars  HIP: FROM with lateral hip pain.   MOOD: Normal thought content, speech, affect, mood and dress are noted.    ASSESSMENT/PLAN:    TIMEA BREED is a 75 year old female who presents for    1. Left-sided chest pain  2. Rib pain on left side  Most likely healing/recovering from cancer removing surgery with continued distress and pain. Unremarkable lung exam but will check Xray for signs of worsening effusion, pneumonia or bony lesion. Consider other imaging and pain management plan pending results. Pt to use lidocaine patch at ngiht and diclofenac gel in AM.   - XR CHEST 2 VIEWS; Future    3. Mild major depression (HCC)  Restarted citalopram given worsening mood s/p surgery and continued  pain  Continue at current doses    4. Thrombocytopenia ([place])  Mild and Noted once post surgery. Likely related to post op state  Can recheck with future blood draw    5. Tobacco use disorder  Improved postop but recent restart. Will trial nicotine inhaler to see if better able to use low dose and help with oral sensation. Discussed risk of too much nicotine.   - nicotine (NICOTROL) 10 MG inhaler; Inhale 1 puff into the lungs as needed for [place]ving (Nicotine)  Dispense: 42 each; Refill: 0    6. Left hip pain  7. Chronic pain of left knee  Likley related to greater trochanter bursitis. Lower concern for intraarticular deformity or etiology.   Referral to orthopedics for evaluation. Pt may benefit from PT in the future  - REFERRAL TO ORTHOPEDICS (INT)    **AVS given and patient instructions reviewed with patient, who had the opportunity to ask questions. The pt verbalizes and demonstrated understanding. Advised to call/RTC with concerns.

## 2021-10-06 NOTE — Progress Notes (Signed)
Pt informed of results by MyChart

## 2021-10-10 NOTE — Telephone Encounter (Signed)
Called patient  Pain is better with patch nightly and diclofenac in AM  Area is still a little firm   Can only sleep on R side and having some reflux  Will restart omeprazole  Defer other imaging or physiatry referral    Having some cough/mucus-  Having right side nasal congestion/runny nose  One day with a little blood but otherwise no bleeding  Pt to call with SOB or worsening pain    For hip pain, placed ortho referral for consideration of trochanter bursitis. Pt prefers to get a phone call to schedule

## 2021-10-18 ENCOUNTER — Other Ambulatory Visit (HOSPITAL_BASED_OUTPATIENT_CLINIC_OR_DEPARTMENT_OTHER): Payer: Self-pay | Admitting: Family Medicine

## 2021-10-18 DIAGNOSIS — M2550 Pain in unspecified joint: Secondary | ICD-10-CM

## 2021-10-18 DIAGNOSIS — F4323 Adjustment disorder with mixed anxiety and depressed mood: Secondary | ICD-10-CM

## 2021-10-18 NOTE — Telephone Encounter (Signed)
PER Pharmacy, Victoria Macdonald is a 75 year old female has requested a refill of diclofenac.      Last Office Visit: 10/03/2021 Fredirick Lathe, MD     Last Physical Exam: 09/27/2017    COLONOSCOPY due on 02/27/2020    Other Med Adult:  Most Recent BP Reading(s)  10/03/21 : 130/60        Cholesterol (mg/dL)   Date Value   04/27/2021 207     LOW DENSITY LIPOPROTEIN DIRECT (mg/dL)   Date Value   04/27/2021 157     HIGH DENSITY LIPOPROTEIN (mg/dL)   Date Value   04/27/2021 38 (L)     TRIGLYCERIDES (mg/dL)   Date Value   04/27/2021 99         THYROID SCREEN TSH REFLEX FT4 (uIU/mL)   Date Value   02/12/2020 4.110 (H)         No results found for: TSH    No results found for: HGBA1C    No results found for: POCA1C      INR (no units)   Date Value   04/27/2021 1.0   04/30/2018 1.0       SODIUM (mmol/L)   Date Value   06/21/2021 138       POTASSIUM (mmol/L)   Date Value   06/21/2021 4.4           CREATININE (mg/dL)   Date Value   06/21/2021 0.7       Documented patient preferred pharmacies:    [id number] [address], [place] - [place]  Phone: [phone] Fax: [phone]

## 2021-10-18 NOTE — Telephone Encounter (Signed)
PER Pharmacy, Victoria Macdonald is a 75 year old female has requested a refill of citalopram.    Pharmacy and patient requesting 90 day supply      Last Office Visit: 10/03/2021 Fredirick Lathe, MD     Last Physical Exam: 09/27/2017    COLONOSCOPY due on 02/27/2020    Other Med Adult:  Most Recent BP Reading(s)  10/03/21 : 130/60        Cholesterol (mg/dL)   Date Value   04/27/2021 207     LOW DENSITY LIPOPROTEIN DIRECT (mg/dL)   Date Value   04/27/2021 157     HIGH DENSITY LIPOPROTEIN (mg/dL)   Date Value   04/27/2021 38 (L)     TRIGLYCERIDES (mg/dL)   Date Value   04/27/2021 99         THYROID SCREEN TSH REFLEX FT4 (uIU/mL)   Date Value   02/12/2020 4.110 (H)         No results found for: TSH    No results found for: HGBA1C    No results found for: POCA1C      INR (no units)   Date Value   04/27/2021 1.0   04/30/2018 1.0       SODIUM (mmol/L)   Date Value   06/21/2021 138       POTASSIUM (mmol/L)   Date Value   06/21/2021 4.4           CREATININE (mg/dL)   Date Value   06/21/2021 0.7       Documented patient preferred pharmacies:    [id number] [address], [place] - [place]  Phone: [phone] Fax: [phone]

## 2021-11-15 ENCOUNTER — Encounter (HOSPITAL_BASED_OUTPATIENT_CLINIC_OR_DEPARTMENT_OTHER): Payer: Self-pay | Admitting: Family Medicine

## 2021-11-15 DIAGNOSIS — M2550 Pain in unspecified joint: Secondary | ICD-10-CM

## 2021-11-15 MED ORDER — DICLOFENAC SODIUM 75 MG PO TBEC
DELAYED_RELEASE_TABLET | ORAL | 1 refills | Status: DC
Start: 2021-11-15 — End: 2021-12-19

## 2021-11-15 NOTE — Telephone Encounter (Signed)
PER Patient (self), Victoria Macdonald is a 76 year old female has requested a refill of diclofenac.      Last Office Visit: 08/24/2021 with caiazzo,r  Last Physical Exam: n.a    COLONOSCOPY due on 02/27/2020    Other Med Adult:  Most Recent BP Reading(s)  10/03/21 : 130/60        Cholesterol (mg/dL)   Date Value   04/27/2021 207     LOW DENSITY LIPOPROTEIN DIRECT (mg/dL)   Date Value   04/27/2021 157     HIGH DENSITY LIPOPROTEIN (mg/dL)   Date Value   04/27/2021 38 (L)     TRIGLYCERIDES (mg/dL)   Date Value   04/27/2021 99         THYROID SCREEN TSH REFLEX FT4 (uIU/mL)   Date Value   02/12/2020 4.110 (H)         No results found for: TSH    No results found for: HGBA1C    No results found for: POCA1C      INR (no units)   Date Value   04/27/2021 1.0   04/30/2018 1.0       SODIUM (mmol/L)   Date Value   06/21/2021 138       POTASSIUM (mmol/L)   Date Value   06/21/2021 4.4           CREATININE (mg/dL)   Date Value   06/21/2021 0.7       Documented patient preferred pharmacies:

## 2021-11-17 ENCOUNTER — Encounter (HOSPITAL_BASED_OUTPATIENT_CLINIC_OR_DEPARTMENT_OTHER): Payer: Self-pay | Admitting: Family Medicine

## 2021-11-18 ENCOUNTER — Ambulatory Visit: Payer: No Typology Code available for payment source | Attending: Family Medicine | Admitting: Family Medicine

## 2021-11-18 ENCOUNTER — Encounter (HOSPITAL_BASED_OUTPATIENT_CLINIC_OR_DEPARTMENT_OTHER): Payer: Self-pay | Admitting: Family Medicine

## 2021-11-18 DIAGNOSIS — C3432 Malignant neoplasm of lower lobe, left bronchus or lung: Secondary | ICD-10-CM

## 2021-11-18 DIAGNOSIS — R0789 Other chest pain: Secondary | ICD-10-CM

## 2021-11-18 NOTE — Progress Notes (Signed)
[facility] FAMILY MEDICINE  Televisit Note   Subjective:   Victoria Macdonald is a 76 year old female patient who presents via televisit today for L sided rib/chest pain.     L chest pain  Ongoing pain, below breast at bottom of rib cage  Swollen in this area  Not in breast tissue   Pain is always present   Worse when lying down, feels like she has pressure on her thorax --> makes her cough, dry cough  Present since she had surgery for lung cancer 05/2021  Interfering with her quality of life, cant' do much - wants to be able to cook, do house activities that she can't do right now   Using OTC lidocaine patches - help but has to buy a lot of them   Hasn't talked to oncologist/surgeon about this - doesn't trust them because not helping her   Thinks she needs ABX  No fevers   No other CP, no SOB  Thinks surgery caused this   Has chronic pain under L arm since surgery - thinks this was from pulling up her arm during surgery  Diclofenac helps this pain       ROS: see HPI     Medications, Allergies, FHx and Social Hx reviewed in Epic and current.        Objective:   Gen: NAD   Pulm: speaking in full sentences, no cough   Chest wall: pt points to L inferior costal margin at mid-clavicular line to indicate pain, no overlying ecchymoses or edema  Psych: alert, affect anxious, thought process goal-oriented     Assessment & Plan:    1. 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 Chest wall pain  Ongoing pain at L anterior inferior costal margin since surgery for lung cancer 05/2021 without changes likely soft tissue injury + scare tissue formation with prolonged healing after surgery. Pt denies SOB or central CP. Pt seen for same pain 12/12 without change since then, exam normal except for tenderness & firmness at location pain. Normal x-ray. Discussed can continue to monitor vs further imaging given pain x 5 months, pain prefers imaging as pain is significantly impacting quality of life. Has CT 3/6 for f/up cancer treatment  but will schedule sooner, can likely then cancel 3/6 appt. Can use Tylenol, topical analgesia PRN  - CT CHEST W CONTRAST; Future    We discussed the patients current medications. The patient expressed understanding and no barriers to adherence were identified.      Next visit: PRN    _____________________    1. The patient indicates understanding of these issues and agrees with the plan.  2. I reviewed the patient's medical information and medical history   3.  I reconciled the patient's medication list and prepared and supplied needed refills.  4.  I have reviewed the past medical, family, and social history sections including the medications and allergies listed in the above medical record    Chester Holstein, MD, 11/18/2021

## 2021-11-30 ENCOUNTER — Other Ambulatory Visit: Payer: Self-pay

## 2021-11-30 ENCOUNTER — Ambulatory Visit
Admission: RE | Admit: 2021-11-30 | Discharge: 2021-11-30 | Disposition: A | Discharge status: Home / Self Care | Payer: No Typology Code available for payment source | Attending: Family Medicine | Admitting: Family Medicine

## 2021-11-30 DIAGNOSIS — Z902 Acquired absence of lung [part of]: Secondary | ICD-10-CM | POA: Diagnosis not present

## 2021-11-30 DIAGNOSIS — J90 Pleural effusion, not elsewhere classified: Secondary | ICD-10-CM | POA: Insufficient documentation

## 2021-11-30 DIAGNOSIS — R918 Other nonspecific abnormal finding of lung field: Secondary | ICD-10-CM | POA: Diagnosis present

## 2021-11-30 DIAGNOSIS — R0789 Other chest pain: Secondary | ICD-10-CM

## 2021-11-30 DIAGNOSIS — C3432 Malignant neoplasm of lower lobe, left bronchus or lung: Secondary | ICD-10-CM

## 2021-11-30 MED ORDER — IOHEXOL 350 MG/ML IV SOLN
45.0000 mL | Freq: Once | INTRAVENOUS | Status: AC
Start: 2021-11-30 — End: 2021-11-30
  Administered 2021-11-30: 45 mL via INTRAVENOUS

## 2021-11-30 MED ORDER — NORMAL SALINE FLUSH 0.9 % IV SOLN
55.0000 mL | Freq: Once | INTRAVENOUS | Status: AC
Start: 2021-11-30 — End: 2021-11-30
  Administered 2021-11-30: 55 mL via INTRAVENOUS

## 2021-12-16 NOTE — Progress Notes (Signed)
THORACIC SURGERY CLINIC FOLLOW UP    CC: Chronic pain, now resolved    Oncologic history:    Status post VATS LLLy at [facility] in Jun 20, 2021 for a growing LLL 2cm  nodule, final path [id number], stage I    HPI:    75F with a history of lung cancer as above that recently had a lot of pain in her thorax since surgery.  Recent CT chest was unrevealing.  She had reported ongoing pain below breast always present, worse when lying down, impairing her quality of life.  She notes that she took diclofenac for 3-4 days and her hip and knee pain and chest wall pain went away.      Denies fevers chest pain or shortness of breath.    New Imaging: CT chest with contrast 11/30/2021      CT scan of the chest reveals:   1. Postsurgical changes status post post interval left lower lobectomy, including small left pleural effusion with loculations.   2. Pulmonary nodules as above. Follow-up CT recommended in 3-6 months per Fleischner Society 2017 guidelines.   3. Atherosclerosis, including moderate CAD.       A/P:    Pt is a 76 year old female with a history of lung cancer and chronic pain since surgery.    Pain: diclofenac prn is working    Lung cancer: Recent CT scan without any evidence of recurrence.  Repeat CT chest with contrast in 6 months and follow-up visit afterwards.            Valinda Party, MD  Thoracic Surgery

## 2021-12-19 ENCOUNTER — Ambulatory Visit
Payer: No Typology Code available for payment source | Attending: Thoracic Surgery (Cardiothoracic Vascular Surgery) | Admitting: Thoracic Surgery (Cardiothoracic Vascular Surgery)

## 2021-12-19 DIAGNOSIS — M2550 Pain in unspecified joint: Secondary | ICD-10-CM | POA: Insufficient documentation

## 2021-12-19 DIAGNOSIS — C3432 Malignant neoplasm of lower lobe, left bronchus or lung: Secondary | ICD-10-CM | POA: Insufficient documentation

## 2021-12-19 MED ORDER — DICLOFENAC SODIUM 75 MG PO TBEC
DELAYED_RELEASE_TABLET | ORAL | 6 refills | Status: AC
Start: 2021-12-19 — End: 2022-02-18

## 2021-12-26 ENCOUNTER — Other Ambulatory Visit (HOSPITAL_BASED_OUTPATIENT_CLINIC_OR_DEPARTMENT_OTHER): Payer: Self-pay

## 2021-12-27 ENCOUNTER — Encounter (HOSPITAL_BASED_OUTPATIENT_CLINIC_OR_DEPARTMENT_OTHER): Payer: Self-pay | Admitting: Family Medicine

## 2021-12-27 DIAGNOSIS — J310 Chronic rhinitis: Secondary | ICD-10-CM

## 2021-12-31 ENCOUNTER — Other Ambulatory Visit (HOSPITAL_BASED_OUTPATIENT_CLINIC_OR_DEPARTMENT_OTHER): Payer: Self-pay | Admitting: Family Medicine

## 2021-12-31 DIAGNOSIS — R0981 Nasal congestion: Secondary | ICD-10-CM

## 2021-12-31 NOTE — Telephone Encounter (Signed)
PER Pharmacy, Victoria Macdonald is a 76 year old female has requested a refill of  - singulair    Last Office Visit: 10/03/21 with pcp    Last Physical Exam: 09/27/2017  COLONOSCOPY due on 02/27/2020  Other Med Adult:  Most Recent BP Reading(s)  10/03/21 : 130/60        Cholesterol (mg/dL)   Date Value   04/27/2021 207     LOW DENSITY LIPOPROTEIN DIRECT (mg/dL)   Date Value   04/27/2021 157     HIGH DENSITY LIPOPROTEIN (mg/dL)   Date Value   04/27/2021 38 (L)     TRIGLYCERIDES (mg/dL)   Date Value   04/27/2021 99         THYROID SCREEN TSH REFLEX FT4 (uIU/mL)   Date Value   02/12/2020 4.110 (H)         No results found for: TSH    No results found for: HGBA1C    No results found for: POCA1C      INR (no units)   Date Value   04/27/2021 1.0   04/30/2018 1.0       SODIUM (mmol/L)   Date Value   06/21/2021 138       POTASSIUM (mmol/L)   Date Value   06/21/2021 4.4           CREATININE (mg/dL)   Date Value   06/21/2021 0.7     Documented patient preferred pharmacies:    [id number] [address], [place] - [place]  Phone: [phone] Fax: [phone]    [facility] [place], [place] - [address]  Phone: [phone] Fax: [phone]    [facility] [address] [place], [place], [place] - [place]  Phone: [phone] Fax: [phone]

## 2022-01-02 ENCOUNTER — Ambulatory Visit (HOSPITAL_BASED_OUTPATIENT_CLINIC_OR_DEPARTMENT_OTHER): Payer: Self-pay | Admitting: Thoracic Surgery (Cardiothoracic Vascular Surgery)

## 2022-01-03 ENCOUNTER — Encounter (HOSPITAL_BASED_OUTPATIENT_CLINIC_OR_DEPARTMENT_OTHER): Payer: Self-pay

## 2022-01-05 ENCOUNTER — Ambulatory Visit (HOSPITAL_BASED_OUTPATIENT_CLINIC_OR_DEPARTMENT_OTHER): Payer: Self-pay | Admitting: Orthopaedic Surgery

## 2022-01-08 ENCOUNTER — Other Ambulatory Visit (HOSPITAL_BASED_OUTPATIENT_CLINIC_OR_DEPARTMENT_OTHER): Payer: Self-pay | Admitting: Family Medicine

## 2022-01-09 NOTE — Telephone Encounter (Signed)
PER Pharmacy, Victoria Macdonald is a 75 year old female has requested a refill of  - allegra    Last Office Visit: 11/18/21 with Corene Cornea    Last Physical Exam: 05/02/16  COLONOSCOPY due on 02/27/2020  Other Med Adult:  Most Recent BP Reading(s)  10/03/21 : 130/60        Cholesterol (mg/dL)   Date Value   04/27/2021 207     LOW DENSITY LIPOPROTEIN DIRECT (mg/dL)   Date Value   04/27/2021 157     HIGH DENSITY LIPOPROTEIN (mg/dL)   Date Value   04/27/2021 38 (L)     TRIGLYCERIDES (mg/dL)   Date Value   04/27/2021 99         THYROID SCREEN TSH REFLEX FT4 (uIU/mL)   Date Value   02/12/2020 4.110 (H)         No results found for: TSH    No results found for: HGBA1C    No results found for: POCA1C      INR (no units)   Date Value   04/27/2021 1.0   04/30/2018 1.0       SODIUM (mmol/L)   Date Value   06/21/2021 138       POTASSIUM (mmol/L)   Date Value   06/21/2021 4.4           CREATININE (mg/dL)   Date Value   06/21/2021 0.7     Documented patient preferred pharmacies:    [id number] [address], [place] - [place]  Phone: [phone] Fax: [phone]    [facility] [place], [place] - [address]  Phone: [phone] Fax: [phone]    [facility] [address] [place], [place], [place] - [place]  Phone: [phone] Fax: [phone]

## 2022-02-11 ENCOUNTER — Other Ambulatory Visit (HOSPITAL_BASED_OUTPATIENT_CLINIC_OR_DEPARTMENT_OTHER): Payer: Self-pay | Admitting: Family Medicine

## 2022-02-11 DIAGNOSIS — F4323 Adjustment disorder with mixed anxiety and depressed mood: Secondary | ICD-10-CM

## 2022-02-11 NOTE — Telephone Encounter (Signed)
PER Pharmacy, Victoria Macdonald is a 76 year old female has requested a refill of                -  celexa.      Last Office Visit: 11/18/2021 with Luz Brazen  Last Physical Exam: 09/27/2017    COLONOSCOPY due on 02/27/2020    Other Med Adult:  Most Recent BP Reading(s)  10/03/21 : 130/60        Cholesterol (mg/dL)   Date Value   04/27/2021 207     LOW DENSITY LIPOPROTEIN DIRECT (mg/dL)   Date Value   04/27/2021 157     HIGH DENSITY LIPOPROTEIN (mg/dL)   Date Value   04/27/2021 38 (L)     TRIGLYCERIDES (mg/dL)   Date Value   04/27/2021 99         THYROID SCREEN TSH REFLEX FT4 (uIU/mL)   Date Value   02/12/2020 4.110 (H)         No results found for: TSH    No results found for: HGBA1C    No results found for: POCA1C      INR (no units)   Date Value   04/27/2021 1.0   04/30/2018 1.0       SODIUM (mmol/L)   Date Value   06/21/2021 138       POTASSIUM (mmol/L)   Date Value   06/21/2021 4.4           CREATININE (mg/dL)   Date Value   06/21/2021 0.7       Documented patient preferred pharmacies:    [id number] [address], [place] - [place]  Phone: [phone] Fax: [phone]

## 2022-03-15 ENCOUNTER — Encounter (HOSPITAL_BASED_OUTPATIENT_CLINIC_OR_DEPARTMENT_OTHER): Payer: Self-pay | Admitting: Family Medicine

## 2022-03-29 ENCOUNTER — Ambulatory Visit (HOSPITAL_BASED_OUTPATIENT_CLINIC_OR_DEPARTMENT_OTHER): Payer: No Typology Code available for payment source | Admitting: Family Medicine

## 2022-04-03 ENCOUNTER — Other Ambulatory Visit (HOSPITAL_BASED_OUTPATIENT_CLINIC_OR_DEPARTMENT_OTHER): Payer: Self-pay | Admitting: Family Medicine

## 2022-04-03 DIAGNOSIS — R0981 Nasal congestion: Secondary | ICD-10-CM

## 2022-04-03 NOTE — Telephone Encounter (Signed)
PER Pharmacy, Victoria Macdonald is a 76 year old female has requested a refill of  - montelukast    Last Office Visit: 11/18/21 with Luz Brazen    Last Physical Exam: 2018  There are no preventive care reminders to display for this patient.  Other Med Adult:  Most Recent BP Reading(s)  10/03/21 : 130/60        Cholesterol (mg/dL)   Date Value   04/27/2021 207     LOW DENSITY LIPOPROTEIN DIRECT (mg/dL)   Date Value   04/27/2021 157     HIGH DENSITY LIPOPROTEIN (mg/dL)   Date Value   04/27/2021 38 (L)     TRIGLYCERIDES (mg/dL)   Date Value   04/27/2021 99         THYROID SCREEN TSH REFLEX FT4 (uIU/mL)   Date Value   02/12/2020 4.110 (H)         No results found for: TSH    No results found for: HGBA1C    No results found for: POCA1C      INR (no units)   Date Value   04/27/2021 1.0   04/30/2018 1.0       SODIUM (mmol/L)   Date Value   06/21/2021 138       POTASSIUM (mmol/L)   Date Value   06/21/2021 4.4           CREATININE (mg/dL)   Date Value   06/21/2021 0.7     Documented patient preferred pharmacies:    [id number] [address], [place] - [place]  Phone: [phone] Fax: [phone]

## 2022-04-17 ENCOUNTER — Telehealth (HOSPITAL_BASED_OUTPATIENT_CLINIC_OR_DEPARTMENT_OTHER): Payer: Self-pay | Admitting: Family Medicine

## 2022-04-17 NOTE — Telephone Encounter (Signed)
STEPHIE XU [id number], 76 year old, female    Calls today:  Clinical Questions (NON-SICK CLINICAL QUESTIONS ONLY)      Specific nature of request: Patient cancelled 7/5 appointment will not be in [place] - looking to be seen anytime after 7/15 needs a referral to gastro. Please reach out to patient to accommodate.       Patient's language of care: English    Patient does not need an interpreter.    Patient's PCP: Fredirick Lathe, MD    Primary Care Home Site:  [facility]

## 2022-04-19 ENCOUNTER — Other Ambulatory Visit (HOSPITAL_BASED_OUTPATIENT_CLINIC_OR_DEPARTMENT_OTHER): Payer: No Typology Code available for payment source

## 2022-04-26 ENCOUNTER — Encounter (HOSPITAL_BASED_OUTPATIENT_CLINIC_OR_DEPARTMENT_OTHER): Payer: No Typology Code available for payment source | Admitting: Family Medicine

## 2022-05-03 ENCOUNTER — Ambulatory Visit: Payer: No Typology Code available for payment source | Attending: Family Medicine | Admitting: Family Medicine

## 2022-05-03 ENCOUNTER — Other Ambulatory Visit (HOSPITAL_BASED_OUTPATIENT_CLINIC_OR_DEPARTMENT_OTHER): Payer: Self-pay | Admitting: Family Medicine

## 2022-05-03 DIAGNOSIS — Z791 Long term (current) use of non-steroidal anti-inflammatories (NSAID): Secondary | ICD-10-CM | POA: Diagnosis present

## 2022-05-03 DIAGNOSIS — M7502 Adhesive capsulitis of left shoulder: Secondary | ICD-10-CM | POA: Insufficient documentation

## 2022-05-03 DIAGNOSIS — R0981 Nasal congestion: Secondary | ICD-10-CM | POA: Insufficient documentation

## 2022-05-03 DIAGNOSIS — R0789 Other chest pain: Secondary | ICD-10-CM | POA: Insufficient documentation

## 2022-05-03 DIAGNOSIS — D696 Thrombocytopenia, unspecified: Secondary | ICD-10-CM | POA: Insufficient documentation

## 2022-05-03 DIAGNOSIS — C3492 Malignant neoplasm of unspecified part of left bronchus or lung: Secondary | ICD-10-CM | POA: Insufficient documentation

## 2022-05-03 DIAGNOSIS — M2550 Pain in unspecified joint: Secondary | ICD-10-CM | POA: Insufficient documentation

## 2022-05-03 DIAGNOSIS — Z5181 Encounter for therapeutic drug level monitoring: Secondary | ICD-10-CM | POA: Insufficient documentation

## 2022-05-03 DIAGNOSIS — C3432 Malignant neoplasm of lower lobe, left bronchus or lung: Secondary | ICD-10-CM | POA: Diagnosis present

## 2022-05-03 NOTE — Telephone Encounter (Signed)
PER Pharmacy, Victoria Macdonald is a 76 year old female has requested a refill of montelukast.      Last Office Visit: 11/18/2021 with Edwinna Areola  Last Physical Exam: 09/27/2017    There are no preventive care reminders to display for this patient.    Other Med Adult:  Most Recent BP Reading(s)  10/03/21 : 130/60        Cholesterol (mg/dL)   Date Value   04/27/2021 207     LOW DENSITY LIPOPROTEIN DIRECT (mg/dL)   Date Value   04/27/2021 157     HIGH DENSITY LIPOPROTEIN (mg/dL)   Date Value   04/27/2021 38 (L)     TRIGLYCERIDES (mg/dL)   Date Value   04/27/2021 99         THYROID SCREEN TSH REFLEX FT4 (uIU/mL)   Date Value   02/12/2020 4.110 (H)         No results found for: TSH    No results found for: HGBA1C    No results found for: POCA1C      INR (no units)   Date Value   04/27/2021 1.0   04/30/2018 1.0       SODIUM (mmol/L)   Date Value   06/21/2021 138       POTASSIUM (mmol/L)   Date Value   06/21/2021 4.4           CREATININE (mg/dL)   Date Value   06/21/2021 0.7       Documented patient preferred pharmacies:    [id number] [address], [place] - [place]  Phone: [phone] Fax: [phone]

## 2022-05-03 NOTE — Progress Notes (Signed)
Televisit Note  Subjective   This patient was identified as meeting criteria for a televisit.  LANGUAGE: English used effectively by patient.    #Multiple sources of pain:  - Persistent Rib cage pain: had imaging 09/2021 and was normal  -- Taking diclofenac and doesn't like this because doesn't feel like a real solution. Doesn't have understanding of the underlying cause.  - Diclofenac is very effective at controlling pain. But when stops taking it then pain comes back. Is wondering about impact on her renal function and blood counts with chronic NSAID use.  - "I don't have 1 day without pain"  - Ongoing pain from lung cancer surgery: Dr. Redmond Pulling did the lung surgery (thoracic) -- has had pain since "I had a surgery for something I know I didn't have." Wondering about second opinion for pulmonology care/thoracic surgery follow-up..  - Has CT scan coming up on 7/20 for monitoring and hoping this will also comment on rib cage.  - Very anxious that PCP can also look at CT reading to look for not just lungs  - No fevers    - Has been a long time since seeing orthopedic. Would want to see this person again. Ideally wants to re-evaluate shoulder pain. Had an appointment in March but cancelled it.    Allergies/current lung function  - Had requested montelukast refill on Epic.   - Breathing: O2 is 98-99%! Feels well. Wasn't sure what montelukast was for. Taking allegra. Taking flonase. Not actually taking montelukast.      Objective   LMP  (LMP Unknown)   Pt breathing comfortably and speaking in full sentences without issue   Affect wnl, thought content normal, speech non-pressured        Assessment & Plan:   1. Arthralgia, unspecified joint: ongoing diffuse polyarhtralgia and more specifically now bothered by adhesive capsulitis of left shoulder and persistent rib cage discomfort. Recommend re-assessment by ortho (has establish relationship)  - REFERRAL TO ORTHOPEDICS (INT)    2. Adhesive capsulitis of left shoulder  -  REFERRAL TO ORTHOPEDICS (INT)    3. Nasal congestion: well controlled with OTC medications. Denies know about montelukast/singulair.    4. Chest wall pain: neg xray in 09/2021 and atraumatic. Also getting CT for lung cancer and able to comment on chest wall as well.    5. Primary cancer of left lower lobe of lung [facility]): pt prefers second opinion to look at case.  - REFERRAL TO THORACIC SURGERY (EXT)    6. Thrombocytopenia ([place]): prior history and now on chronic NSAID use. Will assess plts.  - CBC WITH PLATELET; Future    7. Primary lung adenocarcinoma, left ([place])  - REFERRAL TO THORACIC SURGERY (EXT)    8. Encounter for monitoring chronic NSAID therapy" On higher doses daily - to assess renal function and plts.  - BASIC METABOLIC PANEL; Future  - CBC WITH PLATELET; Future    Curley Spice, MD

## 2022-05-10 ENCOUNTER — Encounter (HOSPITAL_BASED_OUTPATIENT_CLINIC_OR_DEPARTMENT_OTHER): Payer: Self-pay

## 2022-05-11 ENCOUNTER — Ambulatory Visit
Admission: RE | Admit: 2022-05-11 | Discharge: 2022-05-11 | Disposition: A | Discharge status: Home / Self Care | Payer: No Typology Code available for payment source | Attending: Thoracic Surgery (Cardiothoracic Vascular Surgery) | Admitting: Thoracic Surgery (Cardiothoracic Vascular Surgery)

## 2022-05-11 ENCOUNTER — Ambulatory Visit (HOSPITAL_BASED_OUTPATIENT_CLINIC_OR_DEPARTMENT_OTHER)
Admission: RE | Admit: 2022-05-11 | Discharge: 2022-05-11 | Disposition: A | Discharge status: Home / Self Care | Payer: No Typology Code available for payment source | Source: Ambulatory Visit

## 2022-05-11 ENCOUNTER — Other Ambulatory Visit: Payer: Self-pay

## 2022-05-11 DIAGNOSIS — J90 Pleural effusion, not elsewhere classified: Secondary | ICD-10-CM | POA: Diagnosis present

## 2022-05-11 DIAGNOSIS — C3432 Malignant neoplasm of lower lobe, left bronchus or lung: Secondary | ICD-10-CM

## 2022-05-11 DIAGNOSIS — D696 Thrombocytopenia, unspecified: Secondary | ICD-10-CM

## 2022-05-11 DIAGNOSIS — C3490 Malignant neoplasm of unspecified part of unspecified bronchus or lung: Secondary | ICD-10-CM | POA: Insufficient documentation

## 2022-05-11 DIAGNOSIS — Z5181 Encounter for therapeutic drug level monitoring: Secondary | ICD-10-CM

## 2022-05-11 LAB — POC CREATININE
POC CREATININE: 0.8 mg/dl (ref 0.4–1.2)
POC GFR: >60 mL/min (ref 60–?)

## 2022-05-11 LAB — CBC WITH PLATELET
ABSOLUTE NRBC COUNT: 0 10*3/uL (ref 0.0–0.0)
HEMATOCRIT: 41.9 % (ref 34.1–44.9)
HEMOGLOBIN: 13.6 g/dL (ref 11.2–15.7)
MEAN CORP HGB CONC: 32.5 g/dL (ref 31.0–37.0)
MEAN CORPUSCULAR HGB: 29 pg (ref 26.0–34.0)
MEAN CORPUSCULAR VOL: 89.3 fl (ref 80.0–100.0)
MEAN PLATELET VOLUME: 11.3 fL (ref 8.7–12.5)
NRBC %: 0 % (ref 0.0–0.0)
PLATELET COUNT: 156 10*3/uL (ref 150–400)
RBC DISTRIBUTION WIDTH STD DEV: 42 fL (ref 35.1–46.3)
RED BLOOD CELL COUNT: 4.69 M/uL (ref 3.90–5.20)
WHITE BLOOD CELL COUNT: 5.7 10*3/uL (ref 4.0–11.0)

## 2022-05-11 LAB — BASIC METABOLIC PANEL
ANION GAP: 10 mmol/L (ref 10–22)
BUN (UREA NITROGEN): 16 mg/dL (ref 7–18)
CALCIUM: 9 mg/dL (ref 8.5–10.5)
CARBON DIOXIDE: 26 mmol/L (ref 21–32)
CHLORIDE: 107 mmol/L (ref 98–107)
CREATININE: 0.8 mg/dL (ref 0.4–1.2)
ESTIMATED GLOMERULAR FILT RATE: >60 mL/min (ref >60)
Glucose Random: 90 mg/dL (ref 74–160)
POTASSIUM: 4.4 mmol/L (ref 3.5–5.1)
SODIUM: 143 mmol/L (ref 136–145)

## 2022-05-11 MED ORDER — NORMAL SALINE FLUSH 0.9 % IV SOLN
50.00 mL | Freq: Once | INTRAVENOUS | Status: AC
Start: 2022-05-11 — End: 2022-05-11
  Administered 2022-05-11: 50 mL via INTRAVENOUS

## 2022-05-11 MED ORDER — IOHEXOL 350 MG/ML IV SOLN
45.00 mL | Freq: Once | INTRAVENOUS | Status: AC
Start: 2022-05-11 — End: 2022-05-11
  Administered 2022-05-11: 45 mL via INTRAVENOUS

## 2022-05-18 ENCOUNTER — Telehealth (HOSPITAL_BASED_OUTPATIENT_CLINIC_OR_DEPARTMENT_OTHER): Payer: Self-pay | Admitting: Family Medicine

## 2022-05-18 NOTE — Telephone Encounter (Addendum)
I called the patient and Left a voicemail to return call    If patient calls back, please: Book the appointment per the following guidance: See below      ----- Message from Fredirick Lathe, MD sent at 05/17/2022 11:00 PM EDT -----  Regarding: FW: appt and labs  Please call patient and offer an in person appointment with me at the end of Aug for rib pain. Please use one of my 3d slots. If not available, schedule out further with me  I will try to call her next week.    Thanks,  Urban Gibson    ----- Message -----  From: Alphonsa Overall  Sent: 05/17/2022  11:38 AM EDT  To: Fredirick Lathe, MD  Subject: appt and labs                                    Hi Dr. Marissa Calamity    I was on the phone today with your patient rescheduling her orthopedic appt.   She was a bit frustrated and concerned regarding her last few blood work and Ct Scan. She is concerned about the swelling around her left side rib area. She feels as though no one is listening to her problems. No one is communication with her. She tried to book an in person visit with you and was told there was no availability and had to take a tele visit instead.     She is traveling to [place] to attend her brother in Pharmacist, hospital funeral and will be out of town for about 2 weeks. She was wondering is someone can please reach out to her.

## 2022-05-19 ENCOUNTER — Ambulatory Visit (HOSPITAL_BASED_OUTPATIENT_CLINIC_OR_DEPARTMENT_OTHER): Payer: No Typology Code available for payment source | Admitting: Orthopaedic Surgery

## 2022-05-31 ENCOUNTER — Other Ambulatory Visit: Payer: Self-pay | Admitting: Family Medicine

## 2022-06-06 ENCOUNTER — Encounter (HOSPITAL_BASED_OUTPATIENT_CLINIC_OR_DEPARTMENT_OTHER): Payer: No Typology Code available for payment source | Admitting: Orthopaedic Surgery

## 2022-06-12 ENCOUNTER — Telehealth (HOSPITAL_BASED_OUTPATIENT_CLINIC_OR_DEPARTMENT_OTHER): Payer: Self-pay | Admitting: Family Medicine
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
## 2022-06-14 ENCOUNTER — Encounter (HOSPITAL_BASED_OUTPATIENT_CLINIC_OR_DEPARTMENT_OTHER): Payer: No Typology Code available for payment source | Admitting: Family Medicine

## 2022-07-07 ENCOUNTER — Other Ambulatory Visit (HOSPITAL_BASED_OUTPATIENT_CLINIC_OR_DEPARTMENT_OTHER): Payer: Self-pay | Admitting: Family Medicine

## 2022-07-07 DIAGNOSIS — J310 Chronic rhinitis: Secondary | ICD-10-CM

## 2022-07-07 NOTE — Telephone Encounter (Signed)
PER Pharmacy, Victoria Macdonald is a 76 year old female has requested a refill of-  flonase       Last Office Visit: 05/03/22 with Leighton Parody, l  Last Physical Exam: 09/27/17     There are no preventive care reminders to display for this patient.     Other Med Adult:  Most Recent BP Reading(s)  10/03/21 : 130/60        Cholesterol (mg/dL)   Date Value   04/27/2021 207     LOW DENSITY LIPOPROTEIN DIRECT (mg/dL)   Date Value   04/27/2021 157     HIGH DENSITY LIPOPROTEIN (mg/dL)   Date Value   04/27/2021 38 (L)     TRIGLYCERIDES (mg/dL)   Date Value   04/27/2021 99         THYROID SCREEN TSH REFLEX FT4 (uIU/mL)   Date Value   02/12/2020 4.110 (H)         No results found for: TSH    No results found for: HGBA1C    No results found for: POCA1C      INR (no units)   Date Value   04/27/2021 1.0   04/30/2018 1.0       SODIUM (mmol/L)   Date Value   05/11/2022 143       POTASSIUM (mmol/L)   Date Value   05/11/2022 4.4           CREATININE (mg/dL)   Date Value   05/11/2022 0.8     POC CREATININE (mg/dl)   Date Value   05/11/2022 0.8        Documented patient preferred pharmacies:    [id number] [address], [place] - [place]  Phone: [phone] Fax: [phone]

## 2022-08-03 ENCOUNTER — Encounter (HOSPITAL_BASED_OUTPATIENT_CLINIC_OR_DEPARTMENT_OTHER): Payer: Self-pay

## 2022-08-03 ENCOUNTER — Ambulatory Visit: Payer: No Typology Code available for payment source | Attending: Family Medicine

## 2022-08-03 ENCOUNTER — Other Ambulatory Visit: Payer: Self-pay

## 2022-08-03 VITALS — BP 134/74 | HR 92 | Temp 97.6°F | Wt 144.0 lb

## 2022-08-03 DIAGNOSIS — Z131 Encounter for screening for diabetes mellitus: Secondary | ICD-10-CM | POA: Diagnosis present

## 2022-08-03 DIAGNOSIS — G8912 Acute post-thoracotomy pain: Secondary | ICD-10-CM | POA: Diagnosis present

## 2022-08-03 DIAGNOSIS — Z791 Long term (current) use of non-steroidal anti-inflammatories (NSAID): Secondary | ICD-10-CM | POA: Insufficient documentation

## 2022-08-03 DIAGNOSIS — R933 Abnormal findings on diagnostic imaging of other parts of digestive tract: Secondary | ICD-10-CM | POA: Diagnosis present

## 2022-08-03 LAB — COMPREHENSIVE METABOLIC PANEL
ALANINE AMINOTRANSFERASE: 11 U/L — ABNORMAL LOW (ref 12–45)
ALBUMIN: 4.4 g/dL (ref 3.4–5.2)
ALKALINE PHOSPHATASE: 58 U/L (ref 45–117)
ANION GAP: 13 mmol/L (ref 10–22)
ASPARTATE AMINOTRANSFERASE: 21 U/L (ref 8–34)
BILIRUBIN TOTAL: 1.1 mg/dL — ABNORMAL HIGH (ref 0.2–1.0)
BUN (UREA NITROGEN): 12 mg/dL (ref 7–18)
CALCIUM: 9.5 mg/dL (ref 8.5–10.5)
CARBON DIOXIDE: 25 mmol/L (ref 21–32)
CHLORIDE: 105 mmol/L (ref 98–107)
CREATININE: 0.8 mg/dL (ref 0.4–1.2)
ESTIMATED GLOMERULAR FILT RATE: >60 mL/min (ref >60)
Glucose Random: 94 mg/dL (ref 74–160)
POTASSIUM: 4.5 mmol/L (ref 3.5–5.1)
SODIUM: 142 mmol/L (ref 136–145)
TOTAL PROTEIN: 6.8 g/dL (ref 6.4–8.2)

## 2022-08-03 LAB — CBC WITH PLATELET
ABSOLUTE NRBC COUNT: 0 10*3/uL (ref 0.0–0.0)
HEMATOCRIT: 45.1 % — ABNORMAL HIGH (ref 34.1–44.9)
HEMOGLOBIN: 14.9 g/dL (ref 11.2–15.7)
MEAN CORP HGB CONC: 33 g/dL (ref 31.0–37.0)
MEAN CORPUSCULAR HGB: 29.6 pg (ref 26.0–34.0)
MEAN CORPUSCULAR VOL: 89.5 fl (ref 80.0–100.0)
MEAN PLATELET VOLUME: 11.3 fL (ref 8.7–12.5)
NRBC %: 0 % (ref 0.0–0.0)
PLATELET COUNT: 167 10*3/uL (ref 150–400)
RBC DISTRIBUTION WIDTH STD DEV: 42.6 fL (ref 35.1–46.3)
RED BLOOD CELL COUNT: 5.04 M/uL (ref 3.90–5.20)
WHITE BLOOD CELL COUNT: 7 10*3/uL (ref 4.0–11.0)

## 2022-08-03 LAB — HEMOGLOBIN A1C
ESTIMATED AVERAGE GLUCOSE: 111 mg/dL (ref 74–160)
HEMOGLOBIN A1C: 5.5 % (ref 4.0–5.6)

## 2022-08-03 MED ORDER — NORTRIPTYLINE HCL 10 MG PO CAPS
ORAL_CAPSULE | ORAL | 0 refills | Status: DC
Start: 2022-08-03 — End: 2022-12-14

## 2022-08-03 NOTE — Patient Instructions (Signed)
Please do the following after your visit:      Appointment: OMT next available for "left chest wall pain"    Labs: main hallway    [facility] [place]

## 2022-08-03 NOTE — Progress Notes (Signed)
[facility] FAMILY MEDICINE  Office Visit Note     Subjective:   Victoria Macdonald is a 76 year old female patient who presents today for rip pain    # Rib pain  - Ongoing left sided pain since her thoracotomy in 2022  - Already seen by her surgeon for this concern  - Takes tylenol daily, has been trying to stay off diclofenac (PO and topical) out of concern for side effects related to chronic NSAID use  - Currently 3-weeks NSAID-free  - Perhaps mildly improving in the past several months but overall feels like her life has been completely changed by post-operative pain    Medications, Allergies, MedHx, SocHx & FamHx reviewed, current in Epic.    Objective:   BP 134/74   Pulse 92   Temp 97.6 F (36.4 C) (Temporal)   Wt 65.3 kg (144 lb)   LMP  (LMP Unknown)   SpO2 98%   BMI 27.21 kg/m   General: Appears stated age, in no acute distress, answers questions appropriately.  HEENT: NCAT. Clear conjunctiva, no scleral icterus. EOMI.  Neck: Supple, trachea and spine midline, normal ROM.  Lungs: Non-labored respirations.   Heart: Warm and well perfused.   Chest wall: Very tender to palpation over anterior and lateral ribs 7-10 including both bone and cartilage on the left; left lower rib cage asymmetrically prominent   MSK: Warm, moving four extremities independently.   Skin: Incision sites well healed without erythema or drainage   Neuro: AAO x 4. No focal deficits. Gait grossly normal.  Psych: Interactive with questions.     Assessment & Plan:    Victoria Macdonald is a 76 year old female patient who presents today for:    1. Post-thoracotomy pain  Ongoing since surgery in 04/2021, trying to wean off of NSAIDs, still in extreme pain. Will trial TCA for pain management. No clear source or explanation for pain/asymmetric exam on CT and XR, will outreach to radiology regarding further imaging.   - F/u with OMT for chest wall pain management  - F/u in 1 month with PCP  - Outreach to radiology regarding options for further  imaging   - nortriptyline (PAMELOR) 10 MG capsule; Take 1 tablet nightly for a week. Then increase to two tablets nightly for pain management.  Dispense: 90 capsule; Refill: 0    2. NSAID long-term use  For the past year due to post-operative pain.   - COMPREHENSIVE METABOLIC PANEL  - CBC WITH PLATELET    3. Abnormal endoscopy of upper gastrointestinal tract  Abnormal endoscopy with recommendation for 8-week follow-up in 2021, overdue.   - ENDOSCOPY; Future    4. Screening for diabetes mellitus  - HEMOGLOBIN A1C    Victoria Schooner, MD  08/03/22    1. The patient indicates understanding of these issues and agrees with the plan.  2.  The patient is given an After Visit Summary sheet that lists all of their medications with directions, their allergies, orders placed during this encounter, immunization dates, and follow- up instructions.  3. I reviewed the patient's medical information and medical history   4.  I reconciled the patient's medication list and prepared and supplied needed refills.  5.  I have reviewed the past medical, family, and social history sections including the medications and allergies listed in the above medical record

## 2022-08-04 ENCOUNTER — Encounter (HOSPITAL_BASED_OUTPATIENT_CLINIC_OR_DEPARTMENT_OTHER): Payer: Self-pay

## 2022-08-04 NOTE — Progress Notes (Signed)
RN ASSESSMENT COMPLETED

## 2022-08-04 NOTE — Progress Notes (Signed)
PRECEPTOR NOTE  On the day of the patient's visit, I discussed this case with the resident and reviewed findings with the resident.  I confirm the key elements of history and physical exam as described in resident's note.  I agree with the assessment and plan as described below.  Please see resident's note for further details.

## 2022-08-09 ENCOUNTER — Other Ambulatory Visit (HOSPITAL_BASED_OUTPATIENT_CLINIC_OR_DEPARTMENT_OTHER): Payer: Self-pay | Admitting: Thoracic Surgery (Cardiothoracic Vascular Surgery)

## 2022-08-09 DIAGNOSIS — M2550 Pain in unspecified joint: Secondary | ICD-10-CM

## 2022-08-09 NOTE — Telephone Encounter (Signed)
PER Pharmacy, Victoria Macdonald is a 76 year old female has requested a refill of      - Diclofenac (VOLTAREN) 75 MG EC tablet      Last Office Visit: 08/03/22 with pcp  Last Physical Exam: 09/27/17     There are no preventive care reminders to display for this patient.     Other Med Adult:  Most Recent BP Reading(s)  08/03/22 : 134/74        Cholesterol (mg/dL)   Date Value   04/27/2021 207     LOW DENSITY LIPOPROTEIN DIRECT (mg/dL)   Date Value   04/27/2021 157     HIGH DENSITY LIPOPROTEIN (mg/dL)   Date Value   04/27/2021 38 (L)     TRIGLYCERIDES (mg/dL)   Date Value   04/27/2021 99         THYROID SCREEN TSH REFLEX FT4 (uIU/mL)   Date Value   02/12/2020 4.110 (H)         No results found for: TSH    HEMOGLOBIN A1C (%)   Date Value   08/03/2022 5.5       No results found for: POCA1C      INR (no units)   Date Value   04/27/2021 1.0   04/30/2018 1.0       SODIUM (mmol/L)   Date Value   08/03/2022 142       POTASSIUM (mmol/L)   Date Value   08/03/2022 4.5           CREATININE (mg/dL)   Date Value   08/03/2022 0.8        Documented patient preferred pharmacies:    [id number] [address], [place] - [place]  Phone: [phone] Fax: [phone]

## 2022-09-12 ENCOUNTER — Other Ambulatory Visit: Payer: Self-pay

## 2022-09-12 ENCOUNTER — Ambulatory Visit: Payer: No Typology Code available for payment source | Attending: Family Medicine

## 2022-09-12 ENCOUNTER — Encounter (HOSPITAL_BASED_OUTPATIENT_CLINIC_OR_DEPARTMENT_OTHER): Payer: Self-pay

## 2022-09-12 VITALS — BP 138/80 | HR 88 | Wt 145.0 lb

## 2022-09-12 DIAGNOSIS — R17 Unspecified jaundice: Secondary | ICD-10-CM | POA: Diagnosis present

## 2022-09-12 DIAGNOSIS — R1012 Left upper quadrant pain: Secondary | ICD-10-CM | POA: Diagnosis present

## 2022-09-12 DIAGNOSIS — K2270 Barrett's esophagus without dysplasia: Secondary | ICD-10-CM | POA: Insufficient documentation

## 2022-09-12 DIAGNOSIS — M810 Age-related osteoporosis without current pathological fracture: Secondary | ICD-10-CM | POA: Diagnosis present

## 2022-09-12 DIAGNOSIS — G8912 Acute post-thoracotomy pain: Secondary | ICD-10-CM | POA: Insufficient documentation

## 2022-09-12 DIAGNOSIS — K219 Gastro-esophageal reflux disease without esophagitis: Secondary | ICD-10-CM | POA: Insufficient documentation

## 2022-09-12 MED ORDER — DICLOFENAC SODIUM 3 % EX GEL
2.0000 g | Freq: Four times a day (QID) | CUTANEOUS | 3 refills | Status: DC | PRN
Start: 2022-09-12 — End: 2023-02-21

## 2022-09-12 MED ORDER — OMEPRAZOLE 20 MG PO CPDR
20.0000 mg | DELAYED_RELEASE_CAPSULE | Freq: Every day | ORAL | 1 refills | Status: DC
Start: 2022-09-12 — End: 2022-10-14

## 2022-09-12 NOTE — Patient Instructions (Signed)
Please do the following after your visit:       Labs: main hallway      Radiology: MRI [phone] and Ultrasound [phone]

## 2022-09-12 NOTE — Progress Notes (Signed)
[facility] FAMILY MEDICINE  Office Visit Note     Subjective:     Victoria Macdonald is a 76 year old female patient who presents today for:    # Rib pain  - Ongoing since last visit  - Has OMT scheduled 11/22  - Would like MRI to evaluate soft tissue    # Reflux  - Wakes up with burning sensation in throat  - Brings up clear mucus every morning  - Doesn't cough much  - History of h pylori and gastritis, treated, no TOC    # Abdomen  - Concerned that she has ventral hernia    Medications, Allergies, MedHx, SocHx & FamHx reviewed, current in Epic.    Objective:   BP 138/80   Pulse 88   Wt 65.8 kg (145 lb)   LMP  (LMP Unknown)   SpO2 98%   BMI 27.40 kg/m   General: Appears stated age, in no acute distress, answers questions appropriately.  HEENT: NCAT. Clear conjunctiva, no scleral icterus. EOMI.  Neck: Supple, trachea and spine midline, normal ROM.  Lungs: Non-labored respirations.   Heart: Warm and well perfused.   Abdomen: +BS, soft, exquisitely tender in LUQ with positive Carnet sign, mildly tender to palpation everywhere else, + diastasis rectii without fascial defect, no guarding or rebound, no organomegaly or masses.  Skin: Warm, dry, no rashes or lesions noted.  Neuro: AAO x 4. No focal deficits. Gait grossly normal.  Psych: Interactive with questions. Normal mood and affect.     Assessment & Plan:    Victoria Macdonald is a 76 year old female patient who presents today for:    1. Post-thoracotomy pain  Ongoing pain after thoracotomy last summer - CT and CXR unrevealing, per radiology recommendations MRI recommended. Trial higher strength voltaren gel for ongoing pain management. Last visit recommended trial amitriptyline, patient did not start.    - F/u with OMT as scheduled  - MRI CHEST MEDIASTINUM W & WO CONTRAST; Future  - diclofenac sodium 3 % gel; Apply 2 g topically 4 (four) times daily as needed  Dispense: 300 g; Refill: 3    2. Elevated bilirubin  Without other LFT abnormalities on last evaluation, will  repeat today with haptoglobin to r/o hemolysis.   - HEPATIC FUNCTION PANEL; Future  - HAPTOGLOBIN; Future    3. Left upper quadrant pain  Suspect abdominal wall etiology however given persistent LUQ pain will evaluate as below.   - US ABDOMEN COMPLETE; Future  - LIPASE; Future    4. Gastroesophageal reflux disease, unspecified whether esophagitis present  5. Barrett's esophagus without dysplasia  S/p treatment for H pylori without TOC. Will repeat H pylori stool antigen and re-start PPI for symptomatic management   - HELICOBACTER PYLORI STOOL AG; Future  - omeprazole (PRILOSEC) 20 MG capsule; Take 1 capsule by mouth in the morning.  Dispense: 30 capsule; Refill: 1    6. Age-related osteoporosis without current pathological fracture  Due for repeat Vitamin D level.  - VITAMIN D,25 HYDROXY; Future    [name], MD  09/12/22    1. The patient indicates understanding of these issues and agrees with the plan.  2.  The patient is given an After Visit Summary sheet that lists all of their medications with directions, their allergies, orders placed during this encounter, immunization dates, and follow- up instructions.  3. I reviewed the patient's medical information and medical history   4.  I reconciled the patient's medication list  and prepared and supplied needed refills.  5.  I have reviewed the past medical, family, and social history sections including the medications and allergies listed in the above medical record

## 2022-09-12 NOTE — Progress Notes (Signed)
Reviewed chart and discussed the patient with the resident. Agree with assessment and plan as discussed. Pt was seen with resident, + carnet sign, suspicious for abdominal wall pathology.

## 2022-09-13 ENCOUNTER — Ambulatory Visit: Payer: No Typology Code available for payment source | Attending: Family Medicine | Admitting: Family Medicine

## 2022-09-13 ENCOUNTER — Ambulatory Visit: Payer: No Typology Code available for payment source

## 2022-09-13 ENCOUNTER — Encounter (HOSPITAL_BASED_OUTPATIENT_CLINIC_OR_DEPARTMENT_OTHER): Payer: Self-pay | Admitting: Family Medicine

## 2022-09-13 VITALS — BP 125/80 | HR 82 | Temp 96.4°F | Wt 146.0 lb

## 2022-09-13 DIAGNOSIS — M9908 Segmental and somatic dysfunction of rib cage: Secondary | ICD-10-CM | POA: Diagnosis not present

## 2022-09-13 DIAGNOSIS — M9902 Segmental and somatic dysfunction of thoracic region: Secondary | ICD-10-CM | POA: Diagnosis not present

## 2022-09-13 DIAGNOSIS — M542 Cervicalgia: Secondary | ICD-10-CM | POA: Insufficient documentation

## 2022-09-13 DIAGNOSIS — R17 Unspecified jaundice: Secondary | ICD-10-CM | POA: Diagnosis present

## 2022-09-13 DIAGNOSIS — K219 Gastro-esophageal reflux disease without esophagitis: Secondary | ICD-10-CM | POA: Insufficient documentation

## 2022-09-13 DIAGNOSIS — M9907 Segmental and somatic dysfunction of upper extremity: Secondary | ICD-10-CM | POA: Diagnosis present

## 2022-09-13 DIAGNOSIS — R0781 Pleurodynia: Secondary | ICD-10-CM | POA: Diagnosis present

## 2022-09-13 DIAGNOSIS — G8912 Acute post-thoracotomy pain: Secondary | ICD-10-CM | POA: Diagnosis not present

## 2022-09-13 DIAGNOSIS — R1012 Left upper quadrant pain: Secondary | ICD-10-CM | POA: Diagnosis present

## 2022-09-13 DIAGNOSIS — M9901 Segmental and somatic dysfunction of cervical region: Secondary | ICD-10-CM | POA: Diagnosis present

## 2022-09-13 DIAGNOSIS — G8929 Other chronic pain: Secondary | ICD-10-CM | POA: Diagnosis not present

## 2022-09-13 DIAGNOSIS — M810 Age-related osteoporosis without current pathological fracture: Secondary | ICD-10-CM | POA: Insufficient documentation

## 2022-09-13 DIAGNOSIS — M25512 Pain in left shoulder: Secondary | ICD-10-CM | POA: Diagnosis present

## 2022-09-13 LAB — HEPATIC FUNCTION PANEL
ALANINE AMINOTRANSFERASE: 12 U/L (ref 12–45)
ALBUMIN: 4.2 g/dL (ref 3.4–5.2)
ALKALINE PHOSPHATASE: 59 U/L (ref 45–117)
ASPARTATE AMINOTRANSFERASE: 17 U/L (ref 8–34)
BILIRUBIN DIRECT: <0.2 mg/dL (ref 0.0–0.2)
BILIRUBIN TOTAL: 0.5 mg/dL (ref 0.2–1.0)
TOTAL PROTEIN: 6.5 g/dL (ref 6.4–8.2)

## 2022-09-13 LAB — HELICOBACTER PYLORI STOOL AG

## 2022-09-13 LAB — VITAMIN D,25 HYDROXY: VITAMIN D,25 HYDROXY: 23 ng/mL — ABNORMAL LOW (ref 30.0–100.0)

## 2022-09-13 LAB — LIPASE: LIPASE: 64 U/L — ABNORMAL HIGH (ref 13–60)

## 2022-09-13 MED ORDER — LIDOCAINE 5 % EX PTCH
2.00 | MEDICATED_PATCH | CUTANEOUS | 2 refills | Status: AC
Start: 2022-09-13 — End: 2022-12-12

## 2022-09-13 NOTE — Progress Notes (Deleted)
Collected blood sample by venipuncture on left arm successful on  1 attempt patient tolerated well.   [place]

## 2022-09-13 NOTE — Progress Notes (Signed)
76 year old f with LUQ pain since thoracotomy  Along bone on side   No increasd pain with breathing  Hurts when sneezing   Pain does go down side or up towards shoulder sometimes   Feels better when pressing on it -has to sleep on that side   Has tried diet change but didn't help       Was taking dicofenac and it heled  a lot   Took it for 2 m   Stopped it about 3-4 w ago  Lately has gotten better   Previously would hurt with sitting for 10-20 min, walking longer time,   Frustrated she is still having pain     PMH, PSH, meds, allergies, FH, Soc hx, and problem list reviewed and chart updated.   ROS - no skin changes    On exam, WDWN F in NAD, A+O, affect appropriate.  BP 125/80   Pulse 82   Temp 96.4 F (35.8 C) (Temporal)   Wt 66.2 kg (146 lb)   LMP  (LMP Unknown)   SpO2 98%   BMI 27.59 kg/m     Posture L shoulder higher than R   R foot pronation > L    T 4-6 rot L Sb R  Tenderpoints L trap, levator scap, rhomboids  Very tender over scar on L side  L ribs 3-4 in exh  Tenderpoint post L 6th rib   C6 rot L Sb L in flex    1. Post-thoracotomy pain  2. Chronic left shoulder pain  3.Neck and Rib pain on left side  A/P:  1) rib, side, neck pain post thoracotomy with, thoracic, rib,cervical and UE dysfunction.  After verbal informed consent was obtained, OMT was performed including myofascial,  muscle energy, and counterstrain techniques. Area of scar with decreased movement of fascia addressed with myofascial release. The patient experienced partial relief after this treatment and was taught stretches and techniques to use on their own as well.  Reviewed reasons to call or be seen incl increased pain.     Referred to Ext PT as pt lives in [place] and referred to acupuncture.   Will see if lidocaine patches can be covered by insurance.   Continue diclofenac gel     I spent a total of 60 minutes on this visit on the date of service . total time includes all activities performed on the date of  service,  including outreach, coordination of care and documentation

## 2022-09-13 NOTE — Patient Instructions (Addendum)
Get arch supports or good sneakers.   Dr. Felicie Morn or [employer] suppoorts   Shoe brands - Dansko, Naot, Renaldo Harrison  Stores: [employer],(now online)  :  [employer] [address] - near [employer] will fit you for sneakers   -. Listing of primary care doca in [place] for docs   [email or web address]    - Acupuncture would be great   -Look for shoulder wrap gel pack to heat or cool       Posture:   Unlock knees and tip hips forwards .  - Stand with feet hip width apart or on a diagonal and relax your knees (like you're sitting)     Sit near front edge of seat with one leg forward and one back, so your weight is on your sit-bones      Bend forward, head relaxed, ams on a table -hang so that mid back gets stretched.    Look into trying Tai Chi  Swimming would be good     For PT: my exam:    Posture L shoulder higher than R   R foot pronatoin > L    T 4-6 rot L Sb R  Tenderpoints L trap, levator scap, rhomboids  L ribs 3-4 in exh  Tenderpoint post L 6th rib   C6 rot L Sb L in flex

## 2022-09-13 NOTE — Progress Notes (Signed)
Collected blood sample by venipuncture on left arm successful on  1 attempt patient tolerated well.       Received Stool specimen in the lab. Orders released and sent to main lab for testing.       [place], [place], 09/13/2022

## 2022-09-18 LAB — HAPTOGLOBIN: HAPTOGLOBIN: 105 mg/dL (ref 42–346)

## 2022-09-19 ENCOUNTER — Encounter (HOSPITAL_BASED_OUTPATIENT_CLINIC_OR_DEPARTMENT_OTHER): Payer: Self-pay

## 2022-09-19 ENCOUNTER — Other Ambulatory Visit (HOSPITAL_BASED_OUTPATIENT_CLINIC_OR_DEPARTMENT_OTHER): Payer: Self-pay

## 2022-09-19 DIAGNOSIS — E559 Vitamin D deficiency, unspecified: Secondary | ICD-10-CM

## 2022-09-19 DIAGNOSIS — R748 Abnormal levels of other serum enzymes: Secondary | ICD-10-CM

## 2022-09-19 MED ORDER — ERGOCALCIFEROL 1.25 MG (50000 UT) PO CAPS
50000.00 [IU] | ORAL_CAPSULE | ORAL | 0 refills | Status: AC
Start: 2022-09-19 — End: 2022-11-08

## 2022-09-21 ENCOUNTER — Telehealth (HOSPITAL_BASED_OUTPATIENT_CLINIC_OR_DEPARTMENT_OTHER): Payer: Self-pay

## 2022-09-21 NOTE — Telephone Encounter (Signed)
Victoria Macdonald [id number], 76 year old, female    Calls today:  Clinical Questions ([place])    Name of person calling [name]  Specific nature of request need order physical  therapy acupuncture   Return phone number [phone]     Person calling on behalf of patient: vna  Patient's language of care: English    Patient does not need an interpreter.    Patient's PCP: [name], MD    Primary Care Home Site:  [facility]

## 2022-09-22 NOTE — Telephone Encounter (Signed)
Call returned to [facility] from [employer] but unable to reach her. Left her a VMM to call back [place]. When she calls back please clarify:     - What is the PT referral for?   - Is this an external or an internal referral they are requesting?   - If external to [facility], please provide the name of the facility   - What is the Acupuncture referral for?   - Is this to the Acupuncture group visits at [facility]? If yes, please advise regarding the long wait list.   - If external, please provide the name of the facility

## 2022-09-25 ENCOUNTER — Other Ambulatory Visit (HOSPITAL_BASED_OUTPATIENT_CLINIC_OR_DEPARTMENT_OTHER): Payer: Self-pay

## 2022-09-25 DIAGNOSIS — G8929 Other chronic pain: Secondary | ICD-10-CM

## 2022-09-25 NOTE — Telephone Encounter (Signed)
Victoria Macdonald called back.     - External PT with [facility]. Their fax is [phone]. Left Shoulder pain post surgery.   - Acupuncture: External and pt has an apt tomorrow 10/27/21 at [facility].   In [place] phone  number is [phone] also for the left shoulder pain.

## 2022-09-26 ENCOUNTER — Telehealth (HOSPITAL_BASED_OUTPATIENT_CLINIC_OR_DEPARTMENT_OTHER): Payer: Self-pay | Admitting: Orthopaedic Surgery

## 2022-09-26 NOTE — Telephone Encounter (Signed)
Left vm for pt's son (frederico) to cancel her appt with Dr. Madelaine Etienne on 12/29.  Please offer with Lovena Le or MD

## 2022-10-03 ENCOUNTER — Other Ambulatory Visit (HOSPITAL_BASED_OUTPATIENT_CLINIC_OR_DEPARTMENT_OTHER): Payer: Self-pay

## 2022-10-03 DIAGNOSIS — R52 Pain, unspecified: Secondary | ICD-10-CM

## 2022-10-04 ENCOUNTER — Ambulatory Visit
Admission: RE | Admit: 2022-10-04 | Discharge: 2022-10-04 | Disposition: A | Discharge status: Home / Self Care | Payer: No Typology Code available for payment source | Attending: Student in an Organized Health Care Education/Training Program | Admitting: Student in an Organized Health Care Education/Training Program

## 2022-10-04 ENCOUNTER — Ambulatory Visit (HOSPITAL_BASED_OUTPATIENT_CLINIC_OR_DEPARTMENT_OTHER): Payer: No Typology Code available for payment source | Admitting: Physician Assistant

## 2022-10-04 ENCOUNTER — Other Ambulatory Visit: Payer: Self-pay

## 2022-10-04 DIAGNOSIS — M19012 Primary osteoarthritis, left shoulder: Secondary | ICD-10-CM | POA: Diagnosis present

## 2022-10-04 DIAGNOSIS — M7502 Adhesive capsulitis of left shoulder: Secondary | ICD-10-CM

## 2022-10-04 DIAGNOSIS — R52 Pain, unspecified: Secondary | ICD-10-CM

## 2022-10-04 MED ORDER — DICLOFENAC SODIUM 75 MG PO TBEC
75.0000 mg | DELAYED_RELEASE_TABLET | Freq: Two times a day (BID) | ORAL | 2 refills | Status: DC | PRN
Start: 2022-10-04 — End: 2022-12-14

## 2022-10-04 NOTE — Progress Notes (Signed)
Orthopedic Office Note    CC: Left shoulder pain      ORTHOPEDIC PROBLEM LIST:  1. Left adhesive capsulitis  2.    HPI: Victoria Macdonald is a 76 year old female who presents for evaluation of left shoulder pain. She reports that pain started after lung surgery 06/20/22. She had severe pain all along her side, shoulder and up to the base of her skull. It was worse with movement. Therefore, she avoided use of the left arm as much as she could and kept her arm in at her side as if in a sling. She states that she did this for about 13 months. Diclofenac 75mg  was the only thing that would help her pain and she was taking it daily for many months before she stopped due to concern for side effects on her kidneys. She uses it sparingly now.    She has been seeing a new PCP who has been making referrals to treat this pain. She has started acupuncture and has been referred to PT. She says that she is in contact with PT but hasn't gotten an appointment yet.    Denies any numbness or tingling distally.    ROS: Review of systems filled out by patient and scanned into the medical record. Reviewed by me and all other systems are negative except as noted in HPI.    PMH: Past Medical History:  08/13/2018: Astigmatism of both eyes with presbyopia  No date: Back pain  08/13/2018: Dry eye  05/13/2015: LLQ pain  08/13/2018: Squamous blepharitis of upper and lower eyelids of both   eyes    FH:  Review of patient's family history indicates:  Problem: Cancer - Other      Relation: Sister          Age of Onset: (Not Specified)          Comment: Uterus, ovarian, intestinal, liver  Problem: Glaucoma      Relation: Brother          Age of Onset: (Not Specified)  Problem: OTHER      Relation: Sister          Age of Onset: (Not Specified)          Comment: Dementia      Surgical HX: Past Surgical History:  ~1985: ABDOMINAL SURGERY  09/02/27: BELPHAROPTOSIS REPAIR; Bilateral      Comment:  S/p bilateral ELA, lateral direct brow lift and 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   03/2017: PB - PB - AFTER CATARACT LASER SURGERY; Bilateral      Comment:  s/p yag laser capsulotomy OU at [facility]  ~1975: TUBAL LIGATION    SH:   Social History     Socioeconomic History    Marital status: Widowed     Spouse name: Not on file    Number of children: Not on file    Years of education: Not on file    Highest education level: Not on file   Occupational History    Not on file   Tobacco Use    Smoking status: Every Day     Packs/day: 0.25  Years: 53.00     Additional pack years: 0.00     Total pack years: 13.25     Types: Cigarettes    Smokeless tobacco: Never    Tobacco comments:     currently smoking 5/day   Substance and Sexual Activity    Alcohol use: No     Alcohol/week: 0.0 standard drinks of alcohol    Drug use: No    Sexual activity: Not Currently   Other Topics Concern    Not on file   Social History Narrative    2018    Lives alone    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 Was eating shake in the past     Exercise- walking a little     Wears her seatbelt    Denies safety concerns    Faith is important to her   Social Determinants of Health  Financial Resource Strain: Not on file  Food Insecurity: Not on file  Transportation Needs: Not on file  Physical Activity: Not on file  Stress: Not on file  Social Connections: Not on file  Intimate Partner Violence: Not on file  Housing Stability: Not on file    Allergies: Review of Patient's Allergies indicates:   Lactose intolerance*    Other (See Comments)    Comment:bloating    Current Medications:   Current Outpatient Medications:     diclofenac (VOLTAREN) 75 MG EC tablet, Take 1 tablet  by mouth 2 (two) times daily as needed for Pain, Disp: 60 tablet, Rfl: 2    ergocalciferol (VITAMIN D2) 50000 UNIT capsule, Take 1 capsule by mouth once a week  for 8 doses, Disp: 8 capsule, Rfl: 0    lidocaine (LIDODERM) 5 % patch, Place 2 patches onto the skin in the morning. Marland Kitchen Patch(es) may remain in place for up to 12 hours in any 24-hour period.., Disp: 60 patch, Rfl: 2    omeprazole (PRILOSEC) 20 MG capsule, Take 1 capsule by mouth in the morning., Disp: 30 capsule, Rfl: 1    diclofenac sodium 3 % gel, Apply 2 g topically 4 (four) times daily as needed, Disp: 300 g, Rfl: 3    nortriptyline (PAMELOR) 10 MG capsule, Take 1 tablet nightly for a week. Then increase to two tablets nightly for pain management., Disp: 90 capsule, Rfl: 0    fluticasone (FLONASE) 50 MCG/ACT nasal spray, 1 spray by Each Nostril route in the morning., Disp: 16 mL, Rfl: 3    montelukast (SINGULAIR) 10 MG tablet, TAKE 1 TABLET BY MOUTH EVERY DAY IN THE EVENING, Disp: 90 tablet, Rfl: 3    fexofenadine (ALLEGRA) 180 MG tablet, TAKE 1 TABLET BY MOUTH EVERY DAY, Disp: 90 tablet, Rfl: 3    IMAGING: x-ray of the left shoulder today 10/04/22 reveals minimal degenerative changes.  Imaging was reviewed by myself personally during this visit.     PHYSICAL EXAM:  GENERAL: Alert and oriented, in no acute distress  MOOD: Appropriate.   MUSCULOSKELETAL: Evaluation of the left shoulder reveals no gross bony deformities. Her skin is warm, dry and intact. There is no erythema, edema, ecchymosis or effusion. She has TTP along the posterior aspect of the axila. ROM: FF 180 degrees, IR to T10. ER45 degrees with pain. Strength 5/5.  She is neurovascularly intact distally.    ASSESSMENT/PLAN: 76 year old female with left  shoulder pain, chronic. History is and exam concerning for adhesive capsulitis particularly the extended period of avoidance of using the left arm and the limited ER. Recommend intraarticular cortisone injection and PT. Placed referral for IR.  She states that she will call PT back and schedule appointment.     I spent a total of 30 minutes on this visit on the date of service (total time includes all activities performed on the date of service)    Follow up 3 months.    We reviewed the likely diagnosis, the prognosis, and various treatment options in detail. Risks and benefits of treatment plan discussed. The patient's questions have been answered, and the patient understands agrees with treatment plan.       Willis Modena, PA-C, 10/04/2022      Nursing Communication:    ___ Mena Goes:   ___ XOA in splint/cast  ___ XOA out of splint/cast  ___ Cast removal   ___ MRI/CT review  ___ Surgical booking (vitals)  __X_ None

## 2022-10-13 ENCOUNTER — Encounter (HOSPITAL_BASED_OUTPATIENT_CLINIC_OR_DEPARTMENT_OTHER): Payer: Self-pay

## 2022-10-13 ENCOUNTER — Other Ambulatory Visit (HOSPITAL_BASED_OUTPATIENT_CLINIC_OR_DEPARTMENT_OTHER): Payer: Self-pay

## 2022-10-13 DIAGNOSIS — K2270 Barrett's esophagus without dysplasia: Secondary | ICD-10-CM

## 2022-10-18 ENCOUNTER — Encounter (HOSPITAL_BASED_OUTPATIENT_CLINIC_OR_DEPARTMENT_OTHER): Payer: Self-pay

## 2022-10-18 ENCOUNTER — Telehealth (HOSPITAL_BASED_OUTPATIENT_CLINIC_OR_DEPARTMENT_OTHER): Payer: Self-pay

## 2022-10-18 NOTE — Telephone Encounter (Signed)
Called patient to confirm their appt   11/09/22 1155AM     .  Patient aware they need a ride, Patient instructions reviewed and sent mychart

## 2022-10-20 ENCOUNTER — Ambulatory Visit (HOSPITAL_BASED_OUTPATIENT_CLINIC_OR_DEPARTMENT_OTHER): Payer: No Typology Code available for payment source | Admitting: Orthopaedic Surgery

## 2022-10-26 ENCOUNTER — Other Ambulatory Visit: Payer: Self-pay

## 2022-10-26 ENCOUNTER — Ambulatory Visit: Payer: No Typology Code available for payment source | Attending: Family Medicine

## 2022-10-26 VITALS — BP 112/60 | HR 86 | Temp 97.0°F | Wt 146.0 lb

## 2022-10-26 DIAGNOSIS — G8912 Acute post-thoracotomy pain: Secondary | ICD-10-CM | POA: Insufficient documentation

## 2022-10-26 DIAGNOSIS — M7502 Adhesive capsulitis of left shoulder: Secondary | ICD-10-CM | POA: Insufficient documentation

## 2022-10-26 DIAGNOSIS — R748 Abnormal levels of other serum enzymes: Secondary | ICD-10-CM | POA: Insufficient documentation

## 2022-10-26 DIAGNOSIS — K2270 Barrett's esophagus without dysplasia: Secondary | ICD-10-CM | POA: Insufficient documentation

## 2022-10-26 LAB — LIPASE: LIPASE: 30 U/L (ref 13–60)

## 2022-10-26 NOTE — Progress Notes (Unsigned)
[facility]  Office Visit Note     Subjective:     Victoria Macdonald is a 77 year old female patient who presents today for:    # MSK pain   - Started accupuncture 2-3/times per week and helping  - OMT with Dr Araceli Bouche very helpful  - PT: still looking for a place in [place]   - IR: does not want IR guided injection but would accept injection with ortho in clinic for adhesive capsulitis    Medications, Allergies, MedHx, SocHx & FamHx reviewed, current in Epic.    Objective:   BP 112/60   Pulse 86   Temp 97 F (36.1 C) (Temporal)   Wt 66.2 kg (146 lb)   LMP  (LMP Unknown)   SpO2 98%   BMI 27.59 kg/m   General: Appears stated age, in no acute distress, answers questions appropriately.  HEENT: NCAT. Clear conjunctiva, no scleral icterus. EOMI.  Neck: normal ROM.  Lungs: Non-labored respirations.   Heart: Warm and well perfused.   Back: No obvious deformity.  MSK: Warm, moving four extremities independently.   Skin: Warm, dry, no rashes or lesions noted.  Neuro: AAO x 4. No focal deficits. Gait grossly normal.  Psych: Interactive with questions. Normal mood and affect.     Assessment & Plan:    Victoria Macdonald is a 77 year old female patient who presents today for:    1. Post-thoracotomy pain  Ongoing. Improving with accupuncture. OMT helped.   - Return to OMT, patient is interested in ongoing care  - Continue accupuncture  - MRI as scheduled per patient request    2. Adhesive capsulitis of left shoulder  Seen by Ortho recommending PT, IR-guided injection. Patient still looking for PT in local area. Declining IR guided injection.   - Outreach to ortho regarding IR guided vs orthopedic clinic shoulder injection   - Continue to recommend PT     3. Barrett's esophagus without dysplasia  With AM cough and sputum. Restarted PPI at last visit with improvement in symptoms.   - Continue PPI for now  - Endoscopy as scheduled Jan 18  - F/u in February to f/u endoscopy results and discuss ongoing antacid  managament    4. Elevated lipase  Mildly elevated to 63 last visit. Repeat ordered as future lab, not performed. Will repeat today.   - LIPASE    [name], MD  10/26/22    1. The patient indicates understanding of these issues and agrees with the plan.  2.  The patient is given an After Visit Summary sheet that lists all of their medications with directions, their allergies, orders placed during this encounter, immunization dates, and follow- up instructions.  3. I reviewed the patient's medical information and medical history   4.  I reconciled the patient's medication list and prepared and supplied needed refills.  5.  I have reviewed the past medical, family, and social history sections including the medications and allergies listed in the above medical record

## 2022-10-26 NOTE — Patient Instructions (Signed)
Please do the following after your visit:        Appointment: OMT with Araceli Bouche next available for shoulder

## 2022-10-30 NOTE — Progress Notes (Signed)
On the day of the patient's visit, I discussed the key elements of history and physical exam and I reviewed the findings with the resident.  I agree with the assessment and plan as described in their documentation.  Please see resident's note for further details.    Fredirick Lathe, MD, 10/30/2022

## 2022-11-03 ENCOUNTER — Ambulatory Visit
Admission: RE | Admit: 2022-11-03 | Discharge: 2022-11-03 | Disposition: A | Discharge status: Home / Self Care | Payer: No Typology Code available for payment source | Attending: Radiology | Admitting: Radiology

## 2022-11-03 ENCOUNTER — Ambulatory Visit (HOSPITAL_BASED_OUTPATIENT_CLINIC_OR_DEPARTMENT_OTHER)
Admission: RE | Admit: 2022-11-03 | Discharge: 2022-11-03 | Disposition: A | Discharge status: Home / Self Care | Payer: No Typology Code available for payment source | Source: Ambulatory Visit

## 2022-11-03 ENCOUNTER — Other Ambulatory Visit: Payer: Self-pay

## 2022-11-03 DIAGNOSIS — Z85118 Personal history of other malignant neoplasm of bronchus and lung: Secondary | ICD-10-CM | POA: Insufficient documentation

## 2022-11-03 DIAGNOSIS — R0600 Dyspnea, unspecified: Secondary | ICD-10-CM

## 2022-11-03 DIAGNOSIS — Z902 Acquired absence of lung [part of]: Secondary | ICD-10-CM | POA: Diagnosis present

## 2022-11-03 DIAGNOSIS — D734 Cyst of spleen: Secondary | ICD-10-CM | POA: Diagnosis not present

## 2022-11-03 DIAGNOSIS — R1012 Left upper quadrant pain: Secondary | ICD-10-CM | POA: Diagnosis present

## 2022-11-03 DIAGNOSIS — G8912 Acute post-thoracotomy pain: Secondary | ICD-10-CM

## 2022-11-03 DIAGNOSIS — R0789 Other chest pain: Secondary | ICD-10-CM | POA: Diagnosis present

## 2022-11-03 MED ORDER — GADOTERIDOL 279.3 MG/ML IV SOLN
1.00 mL | Freq: Once | INTRAVENOUS | Status: AC
Start: 2022-11-03 — End: 2022-11-03
  Administered 2022-11-03: 13 mL via INTRAVENOUS

## 2022-11-08 ENCOUNTER — Other Ambulatory Visit (HOSPITAL_BASED_OUTPATIENT_CLINIC_OR_DEPARTMENT_OTHER): Payer: Self-pay

## 2022-11-08 DIAGNOSIS — D734 Cyst of spleen: Secondary | ICD-10-CM

## 2022-11-09 ENCOUNTER — Encounter (HOSPITAL_BASED_OUTPATIENT_CLINIC_OR_DEPARTMENT_OTHER): Payer: Self-pay | Admitting: Certified Registered"

## 2022-11-09 ENCOUNTER — Ambulatory Visit
Admission: RE | Admit: 2022-11-09 | Discharge: 2022-11-09 | Disposition: A | Discharge status: Home / Self Care | Payer: No Typology Code available for payment source

## 2022-11-09 ENCOUNTER — Ambulatory Visit (HOSPITAL_BASED_OUTPATIENT_CLINIC_OR_DEPARTMENT_OTHER): Payer: Self-pay | Admitting: Certified Registered"

## 2022-11-09 ENCOUNTER — Other Ambulatory Visit: Payer: Self-pay

## 2022-11-09 ENCOUNTER — Ambulatory Visit (HOSPITAL_BASED_OUTPATIENT_CLINIC_OR_DEPARTMENT_OTHER): Payer: No Typology Code available for payment source | Admitting: Gastroenterology

## 2022-11-09 DIAGNOSIS — R933 Abnormal findings on diagnostic imaging of other parts of digestive tract: Secondary | ICD-10-CM | POA: Diagnosis not present

## 2022-11-09 DIAGNOSIS — Z85118 Personal history of other malignant neoplasm of bronchus and lung: Secondary | ICD-10-CM | POA: Diagnosis not present

## 2022-11-09 DIAGNOSIS — M810 Age-related osteoporosis without current pathological fracture: Secondary | ICD-10-CM | POA: Insufficient documentation

## 2022-11-09 DIAGNOSIS — K219 Gastro-esophageal reflux disease without esophagitis: Secondary | ICD-10-CM | POA: Insufficient documentation

## 2022-11-09 DIAGNOSIS — Z9229 Personal history of other drug therapy: Secondary | ICD-10-CM | POA: Insufficient documentation

## 2022-11-09 DIAGNOSIS — K31819 Angiodysplasia of stomach and duodenum without bleeding: Secondary | ICD-10-CM | POA: Diagnosis not present

## 2022-11-09 DIAGNOSIS — F17200 Nicotine dependence, unspecified, uncomplicated: Secondary | ICD-10-CM | POA: Insufficient documentation

## 2022-11-09 DIAGNOSIS — K2950 Unspecified chronic gastritis without bleeding: Secondary | ICD-10-CM | POA: Insufficient documentation

## 2022-11-09 DIAGNOSIS — K2270 Barrett's esophagus without dysplasia: Secondary | ICD-10-CM | POA: Diagnosis not present

## 2022-11-09 MED ORDER — LIDOCAINE HCL (PF) 2 % IJ SOLN
INTRAMUSCULAR | Status: AC
Start: 2022-11-09 — End: 2022-11-09
  Filled 2022-11-09: qty 5

## 2022-11-09 MED ORDER — OMEPRAZOLE 40 MG PO CPDR
40.0000 mg | DELAYED_RELEASE_CAPSULE | Freq: Every morning | ORAL | 3 refills | Status: DC
Start: 2022-11-09 — End: 2023-11-09

## 2022-11-09 MED ORDER — PROPOFOL 200 MG/20 ML IV - AN
Freq: Once | INTRAVENOUS | Status: DC | PRN
Start: 2022-11-09 — End: 2022-11-09
  Administered 2022-11-09: 150 mg via INTRAVENOUS

## 2022-11-09 MED ORDER — PROPOFOL 500 MG/50 ML IV
INTRAVENOUS | Status: DC | PRN
Start: 2022-11-09 — End: 2022-11-09
  Administered 2022-11-09: 200 ug/kg/min via INTRAVENOUS

## 2022-11-09 MED ORDER — PROPOFOL 200 MG/20ML IV EMUL
INTRAVENOUS | Status: AC
Start: 2022-11-09 — End: 2022-11-09
  Filled 2022-11-09: qty 20

## 2022-11-09 MED ORDER — LACTATED RINGERS IV SOLN
INTRAVENOUS | Status: DC
Start: 2022-11-09 — End: 2022-11-10
  Administered 2022-11-09: 300 mL via INTRAVENOUS

## 2022-11-09 MED ORDER — LIDOCAINE HCL (PF) 2 % IJ SOLN
Freq: Once | INTRAMUSCULAR | Status: DC | PRN
Start: 2022-11-09 — End: 2022-11-09
  Administered 2022-11-09: 4 mL via INTRAVENOUS

## 2022-11-09 MED ORDER — PROPOFOL INFUSION
INTRAVENOUS | Status: AC
Start: 2022-11-09 — End: 2022-11-09
  Filled 2022-11-09: qty 50

## 2022-11-09 NOTE — H&P (Signed)
H&P NOTE     Chief Complaint:   GERD, Barrett's esophagus    This 77 year old year old English speaking patient who presents for an EGD.    She reports persistent reflux despite taking omeprazole 29m daily. She has been taking an herbal tea that helps more than the omeprazole. +phlegm with clear white sputum. No dysphagia.  She has had no melena, hematochezia, abdominal pain or weight loss.     No abdominal surgeries  No family history of esophageal cancer  No blood thinners    Patient Active Problem List:  Patient Active Problem List:     Right low back pain     Tobacco use disorder     Sessile colonic polyp     Adjustment disorder with depressed mood     Osteoporosis     GAD (generalized anxiety disorder)     Gastroesophageal reflux disease without esophagitis     Lung nodules     Adhesive capsulitis of left shoulder     Nasal congestion     HCV antibody positive     Hepatitis B surface antigen positive     H. pylori infection     Postmenopausal bleeding     Pseudophakia of both eyes     Astigmatism of both eyes with presbyopia     Dry eye     Squamous blepharitis of upper and lower eyelids of both eyes     Arthralgia     Urinary incontinence, nocturnal enuresis     Diverticulosis of large intestine     Barrett's esophagus     [place]     Primary lung adenocarcinoma, left ([place]     Mild major depression ([facility]    Review of Patient's Allergies indicates:   Lactose intolerance*    Other (See Comments)    Comment:bloating       Social History     Socioeconomic History    Marital status: Widowed     Spouse name: Not on file    Number of children: Not on file    Years of education: Not on file    Highest education level: Not on file   Occupational History    Not on file   Tobacco Use    Smoking status: Every Day     Packs/day: 0.25     Years: 53.00     Additional pack years: 0.00     Total pack years: 13.25     Types: Cigarettes    Smokeless tobacco: Never    Tobacco comments:      currently smoking 5/day   Substance and Sexual Activity    Alcohol use: No     Alcohol/week: 0.0 standard drinks of alcohol    Drug use: No    Sexual activity: Not Currently   Other Topics Concern    Not on file   Social History Narrative    2018    Lives alone    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 Was eating shake in the past     Exercise- walking a little     Wears her seatbelt    Denies safety concerns    Faith is important to her   Social Determinants of Health  Financial Resource Strain: Not on file  Food Insecurity: Not  on file  Transportation Needs: Not on file  Physical Activity: Not on file  Stress: Not on file  Social Connections: Not on file  Intimate Partner Violence: Not on file  Housing Stability: Not on file      Current Outpatient Medications   Medication Sig    omeprazole (PRILOSEC) 20 MG capsule Take 1 capsule by mouth every morning    diclofenac (VOLTAREN) 75 MG EC tablet Take 1 tablet by mouth 2 (two) times daily as needed for Pain    ergocalciferol (VITAMIN D2) 50000 UNIT capsule Take 1 capsule by mouth once a week  for 8 doses    lidocaine (LIDODERM) 5 % patch Place 2 patches onto the skin in the morning. Marland Kitchen Patch(es) may remain in place for up to 12 hours in any 24-hour period..    diclofenac sodium 3 % gel Apply 2 g topically 4 (four) times daily as needed    nortriptyline (PAMELOR) 10 MG capsule Take 1 tablet nightly for a week. Then increase to two tablets nightly for pain management.    fluticasone (FLONASE) 50 MCG/ACT nasal spray 1 spray by Each Nostril route in the morning.    montelukast (SINGULAIR) 10 MG tablet TAKE 1 TABLET BY MOUTH EVERY DAY IN THE EVENING    fexofenadine (ALLEGRA) 180 MG tablet TAKE 1 TABLET BY MOUTH EVERY DAY     No current facility-administered medications for this encounter.         Review of patient's family history  indicates:  Problem: Cancer - Other      Relation: Sister          Age of Onset: (Not Specified)          Comment: Uterus, ovarian, intestinal, liver  Problem: Glaucoma      Relation: Brother          Age of Onset: (Not Specified)  Problem: OTHER      Relation: Sister          Age of Onset: (Not Specified)          Comment: Dementia    REVIEW OF SYSTEMS:   Cardiovascular:  No chest pain, LE edema  Pulmonary:  No SOB or cough  Neuro:  No focal weakness, seizures    Physical Exam:  Vital Signs: Ht _0  (1.575 m)   Wt 65.3 kg (144 lb)   LMP  (LMP Unknown)   BMI 26.34 kg/m   General: Awake, alert, NAD  Pulmonary: Normal WOB, no wheezing  Cardiovascular: RRR, no murmurs  Abdominal: soft, NT/ND  Neuro: Nonfocal, moving all extremities symmetrically    ASA 3    Impression:  GERD, Barrett's esophagus    Medical Decision Making:  The patient will have a diagnostic EGD done. The patient was informed of the risk of bleeding, discomfort, perforation, a need for surgery, infection, drug reaction, cardiovascular and cerebrovascular compromise and the possibility of missing a lesion or problem.  She understood these risks and consented to the exam.     All questions were answered. The patient is in agreement with our plan. The patient will have MAC anesthesia support during their procedure.       Knox Saliva, MD, 11/09/2022

## 2022-11-09 NOTE — Anesthesia Postprocedure Evaluation (Signed)
Anesthesia Post-Operative Evaluation Note    Patient: Victoria Macdonald           Procedure Summary       Date: 11/09/22 Room / Location: [facility] - Gastroenterology    Anesthesia Start: 1216 Anesthesia Stop:     Procedure: EGD Diagnosis: Abnormal endoscopy of upper gastrointestinal tract    Scheduled Providers: Knox Saliva, MD; Gwenette Greet, MD Responsible Provider: Gwenette Greet, MD    Anesthesia Type: general ASA Status: 2              POST-OPERATIVE EVALUATION    Anesthesia Post Evaluation    Vitals signs in patient's normal range: Yes  Respiratory function stable; airway patent: Yes  Cardiovascular function stable: Yes  Hydration status stable: Yes  Mental status recovered; patient participates in evaluation and/or is at baseline: Yes  Pain control satisfactory: Yes  Nausea and vomiting control satisfactory: YesProcedure was labor & delivery no  PostOP disposition PACU  Anesthesia Observation no significant observation    MIPS#404 Anesthesiology Smoking Abstinence:     The patient is a current smoker (e.g. cigarette, cigar, pipe, e-cigarette/vaping/marijuana) (Y8657): Yes   The patient underwent an elective surgery or procedure requiring anesthesia (Q4696) : Yes   The patient received preop smoking cessation instructions prior to the day of surgery or procedure by MD, APC or RN proxy staff [phone]): Yes   The patient smoked the day of the procedure (U1324): Yes    FOR CODING USE ONLY: IF BLANK M0102    MIPS#477 Multimodal Pain Management:  Emergent - Exclusion case: No  Patient was administered multimodal pain management (two or more drugs and/or interventions excluding systemic opioids) in the perioperative period; occurring at some time between 6 hours prior to anesthesia start time until discharged from: No   List medical reason(s) patient did not receive multimodal pain management (V2536): no pain anticipated postop  FOR CODING USE ONLY: REASON NOT LISTED  U4403    KVQQ#595 Perioperative Temperature Management:  Anesthesia start to Anesthesia end time was 60 minutes or longer (4256F): No   FOR CODING USE ONLY: REASON NOT LISTED G3875    MIPS#430 Adult Prevention of PONV:  Patient received an inhalational anesthetic (IE332): No  FOR CODING USE ONLY: REASON NOT LISTED R5188    MIPS#463 Pediatric Prevention of PONV:  Pediatric patient?: No  FOR CODING USE ONLY: REASON NOT LISTED C1660        [employer] # [id number]          Last vitals    BP: 93/64 (11/09/2022 12:45 PM)  Temp: 97.5 F (36.4 C) (11/09/2022 12:37 PM)  Pulse: 77 (11/09/2022 12:45 PM)  Resp: 16 (11/09/2022 12:45 PM)  SpO2: 96 % (11/09/2022 12:45 PM)

## 2022-11-09 NOTE — Anesthesia Preprocedure Evaluation (Signed)
Pre-Anesthetic Note  .      Patient: Victoria Macdonald is a 77 year old female      Procedure Information       Date/Time: 11/09/22 1240    Scheduled providers: Knox Saliva, MD; Gwenette Greet, MD    Procedure: EGD    Diagnosis: Abnormal endoscopy of upper gastrointestinal tract [R93.3]    Location: [facility] - Gastroenterology            Relevant Problems   No relevant active problems           Previous Anesthetic History:   Past Surgical History:  ~1985: ABDOMINAL SURGERY  09/02/27: BELPHAROPTOSIS REPAIR; Bilateral      Comment:  S/p bilateral ELA, lateral direct brow lift and excision               of xanthelasmas with full thickness skin grafts nasal                bridge bilaterally 09/11/2017  03/2017: CATARACT EXTRACTION, BILATERAL; Bilateral      Comment:  s/p cataract extraction with PC IOL OU, at [facility], Dr.   03/2017: PB - PB - AFTER CATARACT LASER SURGERY; Bilateral      Comment:  s/p yag laser capsulotomy OU at [facility]  ~1975: TUBAL LIGATION        Medications  Current Outpatient Medications   Medication Sig    omeprazole (PRILOSEC) 20 MG capsule Take 1 capsule by mouth every morning    diclofenac (VOLTAREN) 75 MG EC tablet Take 1 tablet by mouth 2 (two) times daily as needed for Pain    ergocalciferol (VITAMIN D2) 50000 UNIT capsule Take 1 capsule by mouth once a week  for 8 doses    lidocaine (LIDODERM) 5 % patch Place 2 patches onto the skin in the morning. Marland Kitchen Patch(es) may remain in place for up to 12 hours in any 24-hour period..    diclofenac sodium 3 % gel Apply 2 g topically 4 (four) times daily as needed    nortriptyline (PAMELOR) 10 MG capsule Take 1 tablet nightly for a week. Then increase to two tablets nightly for pain management.    fluticasone (FLONASE) 50 MCG/ACT nasal spray 1 spray by Each Nostril route in the morning.    montelukast (SINGULAIR) 10 MG tablet TAKE 1 TABLET BY MOUTH EVERY DAY IN THE EVENING    fexofenadine (ALLEGRA) 180 MG tablet TAKE 1  TABLET BY MOUTH EVERY DAY     Current Facility-Administered Medications   Medication    lactated ringers infusion         Allergies:   Review of Patient's Allergies indicates:   Lactose intolerance*    Other (See Comments)    Comment:bloating    Smoking, Alcohol, Drugs:  Social History    Tobacco Use      Smoking status: Every Day        Packs/day: 0.25        Years: 53.00        Additional pack years: 0.00        Total pack years: 13.25        Types: Cigarettes      Smokeless tobacco: Never      Tobacco comments: currently smoking 5/day    Alcohol use: No      Alcohol/week: 0.0 standard drinks of alcohol      Drug use:   No       PMHx:  Past Medical History:  08/13/2018: Astigmatism of both eyes with presbyopia  No date: Back pain  08/13/2018: Dry eye  05/13/2015: LLQ pain  08/13/2018: Squamous blepharitis of upper and lower eyelids of both   eyes    Vitals  LMP  (LMP Unknown)     Review of Systems        Anesthetic History:   negative anesthesia history ROS           Cardiovascular: Negative negative for cardiovascular disease.  Negative for chest pain and acute myocardial infarction.   Pulmonary: Positive for tobacco use (smoked DOS). S/p LL lobectomy         GU/Renal: Negative for GU/renal diseases.    Hepatic: Skin negative for hepatic disease.    Neurological:  Negative for seizures and strokes.   Gastrointestinal:  Positive for GERD.   Hematological: Negative for hematological diseases.    Psychiatric:  Positive for depression and anxiety.        Physical Exam    General     Level of consciousness:  Alert and oriented (time, person, place)   BMI   BMI <18.5 kg/m2   Airway     Mallampati:  II    TM distance:  >3 FB    Mouth opening:  >3 FB    Neck ROM:  Mildly Limited   Teeth     Heart      Lungs                  Pertinent Labs:   Lab Results   Component Value Date    NA 142 08/03/2022    K 4.5 08/03/2022    CREAT 0.8 08/03/2022    GLUCOSER 94 08/03/2022    WBC 7.0 08/03/2022    HCT 45.1 (H) 08/03/2022    PLTA  167 08/03/2022    PT 11.6 04/27/2021    APTT < 26.7 (L) 04/27/2021    INR 1.0 04/27/2021         Anesthesia Plan    ASA Score:     ASA:  2    Airway:      Mallampati:  II    Mouth opening:  >3 FB    Neck ROM:  Mildly Limited    TM distance:  >3 FB     Plan: general    Other information:     EKG Reviewed: : No      Full Stomach Precaution:: No      Post-Plan::  PACU    Informed Consent:     Anesthetic plan and risks discussed with:  Patient   Patient Consented        Attending Anesthesiologist Statement:     Reassessed day of surgery: Yes        Assessment made, necessary equipment and appropriate plan in place.

## 2022-11-09 NOTE — PROVATION-GI (Signed)
[facility]  Patient Name: Victoria Macdonald  MRN: [id number]  CSN: [id number]  Date of Birth: [birth year]  Admit Type: Outpatient  Age: 77  Gender: Female  Note Status: Finalized  Patient Location: [place]  Referring MD:          [name] MD, MD  Procedure Date:        11/09/2022 12:09:17 PM  Procedure:             Upper GI endoscopy  Endoscopist:           Duayne Cal MD, MD  Indications for Procedure:       Esophageal reflux, Reported Barrett's esophagus  Medications:           Monitored Anesthesia Care  Procedure:       Just prior to the procedure, an updated history and physical was done. I        obtained an informed consent from the patient reviewing the risk of the        procedure including (but not limited to) respiratory depression, perforation,        bleeding, discomfort, a possible need for surgery and unexpected reactions to        medications. The patient is aware that test has limitations and may not        detect significant lesions such as cancer or other potential diseases. The        patient was also informed that they might need a repeat upper endoscopy        earlier than standard guidelines if there are changes in their symptoms or        concerning findings noted. A time out was performed with the entire procedure        staff present. The scope was passed under direct vision. Throughout the        procedure, the patient's blood pressure, pulse, and oxygen saturations were        monitored continuously. The [id number] was introduced through the        mouth, and advanced to the second part of duodenum. The upper GI endoscopy        was accomplished without difficulty. The patient tolerated the procedure        well. The total duration of the procedure was 9 minutes.  Findings:       The Z-line was irregular and was found 40 cm from the incisors. No endoscopic        findings suggestive of Barrett's       Patchy mild inflammation characterized by erythema was found  in the gastric        antrum. Biopsies were taken with a cold forceps for histology.       A single 5 mm angioectasia without bleeding was found in the duodenal bulb.        Coagulation for bleeding prevention using bipolar probe was successful.  Post Procedure Diagnosis:       - Z-line irregular, 40 cm from the incisors.       - Gastritis. Biopsied.       - A single non-bleeding angioectasia in the duodenum. Treated with bipolar        cautery.  Complications:         No immediate complications.  Estimated Blood Loss:  Estimated blood loss: none.  Recommendation:       - Await pathology results.       - Use Prilosec (omeprazole)  40 mg PO daily.       - GERD prevention diet.       - Follow an antireflux regimen.       - Repeat upper endoscopy for Barrett's surveillance is not necessary given no        endoscopic evidence and age.       - Return to GI clinic PRN.  Knox Saliva, MD  Legrand Como Laurie Panda MD, MD  11/09/2022 12:39:14 PM  This report has been signed electronically.  Number of Addenda: 0  Note Initiated On: 11/09/2022 12:09 PM

## 2022-11-13 ENCOUNTER — Other Ambulatory Visit: Payer: Self-pay

## 2022-11-13 ENCOUNTER — Ambulatory Visit
Admission: RE | Admit: 2022-11-13 | Discharge: 2022-11-13 | Disposition: A | Discharge status: Home / Self Care | Payer: No Typology Code available for payment source

## 2022-11-13 DIAGNOSIS — D734 Cyst of spleen: Secondary | ICD-10-CM | POA: Insufficient documentation

## 2022-11-13 DIAGNOSIS — R748 Abnormal levels of other serum enzymes: Secondary | ICD-10-CM | POA: Diagnosis present

## 2022-11-13 LAB — LIPASE: LIPASE: 30 U/L (ref 13–60)

## 2022-11-15 LAB — ECHINOCOCCUS ANTIBODY: ECHINOCOCCUS ANTIBODY: NEGATIVE

## 2022-11-15 LAB — SURGICAL PATH SPECIMEN GASTROINTESTINAL

## 2022-11-16 ENCOUNTER — Encounter (HOSPITAL_BASED_OUTPATIENT_CLINIC_OR_DEPARTMENT_OTHER): Payer: Self-pay | Admitting: Gastroenterology

## 2022-11-16 ENCOUNTER — Other Ambulatory Visit: Payer: Self-pay

## 2022-11-16 ENCOUNTER — Other Ambulatory Visit (HOSPITAL_BASED_OUTPATIENT_CLINIC_OR_DEPARTMENT_OTHER): Payer: Self-pay

## 2022-11-16 ENCOUNTER — Ambulatory Visit: Payer: No Typology Code available for payment source | Attending: Gastroenterology | Admitting: Gastroenterology

## 2022-11-16 VITALS — BP 145/80 | HR 87 | Temp 96.7°F | Wt 145.0 lb

## 2022-11-16 DIAGNOSIS — K5792 Diverticulitis of intestine, part unspecified, without perforation or abscess without bleeding: Secondary | ICD-10-CM | POA: Diagnosis not present

## 2022-11-16 DIAGNOSIS — D734 Cyst of spleen: Secondary | ICD-10-CM

## 2022-11-16 NOTE — Progress Notes (Signed)
Date of Service: 11/16/2022      This 77 year old woman is seen back in followup for her endoscopy done by Dr.   Sabino Gasser on January 18th for previous report of Barrett's esophagus and reflux.    The endoscopy was visually normal with a little bit of inflammation in the   antrum , and the biopsy showed gastritis.  Biopsy showed gastritis.  There was   no intestinal metaplasia, no H. pylori, and no Barrett's esophagus.     I went over her previous reports of diverticulitis with a negative followup   colonoscopy by Dr. Marcelene Butte.     She is feeling perfectly well at present.     On physical exam, her abdomen is totally benign.     I discussed with her diverticulitis and the need to come to the Emergency Room   if she is having severe pain or fever, she should be on a high fiber diet,   eating lots of fresh fruits and vegetables.     For her upper GI symptoms, I do not think we need to do anything further.  She   does not need to have regular endoscopies done.     She is going to follow up with her primary care provider, Dr. [name].     CC:  Dr. [name]                                                             Reviewed and Electronically Signed By: Delena Serve, MD  Sig Date: 11/16/2022  Sig Time: 12:13:38  Dictated By: Delena Serve, MD  Dict Date: 11/16/2022 Dict Time: 11:42 AM      Dictation Date and Time:  11/16/2022 11:42:39  Transcription Date and Time:  11/16/2022 12:10:00   eScription Dictation id: [id number]

## 2022-11-16 NOTE — Progress Notes (Signed)
Patient feels physically safe at home.

## 2022-11-16 NOTE — Progress Notes (Signed)
I spent 22 minutes on this visit on this date    Primary note dictated through E-Scription.  See "Notes" tab in Chart Review for transcribed note; final note also appears within encounter.

## 2022-11-17 ENCOUNTER — Ambulatory Visit (HOSPITAL_BASED_OUTPATIENT_CLINIC_OR_DEPARTMENT_OTHER): Payer: No Typology Code available for payment source | Admitting: Physician Assistant

## 2022-11-17 ENCOUNTER — Other Ambulatory Visit (HOSPITAL_BASED_OUTPATIENT_CLINIC_OR_DEPARTMENT_OTHER): Payer: Self-pay | Admitting: Internal Medicine

## 2022-11-17 DIAGNOSIS — K7689 Other specified diseases of liver: Secondary | ICD-10-CM

## 2022-11-17 NOTE — e-consult (Unsigned)
Infectious diseases e-Consultation    Thank you for referring your patient for an e-consultation. I have not interviewed or examined the patient. My recommendations here are based on the review of relevant clinical information and information provided by the referring provider.    Consultation request:   16X with abdominal ultrasound performed for left upper quadrant pain which showed hepatic and splenic cysts. In my reading, splenic cysts are most commonly caused by parasitic disease. Echinococcus serology ordered and negative. Do you recommend any further parasitic work-up?      Case Summary:  As above    Based on review of radiology report for cover has had cyst burden and multiple intra-abdominal organs for many years (at least starting in 2016, when earliest radiology reports available for review).    Assessment/Recommendations:    In foreign born individuals, as you indicate Echinococcus would be the most common cause the type of cystic burden described here.  Serology can be falsely negative depending on quantity size and distribution of cysts    Can refer for formal ID evaluation for next steps and consideration of empiric treatment.    Please let me know whether I can be of further assistance and please let me know how the patient responds to your management.    Sincerely yours,    Charlyne Mom, MD    Patient Disposition : REGULAR FOLLOW UP VISIT NEEDED    Time spent on this e-Consultation :  5-10 minutes, >50% of the total time devoted to medical consultative verbal/ EMR discussion between the providers, written and verbal report will be generated

## 2022-11-23 ENCOUNTER — Other Ambulatory Visit (HOSPITAL_BASED_OUTPATIENT_CLINIC_OR_DEPARTMENT_OTHER): Payer: Self-pay

## 2022-11-23 DIAGNOSIS — D734 Cyst of spleen: Secondary | ICD-10-CM

## 2022-11-24 ENCOUNTER — Encounter (HOSPITAL_BASED_OUTPATIENT_CLINIC_OR_DEPARTMENT_OTHER): Payer: Self-pay | Admitting: Gastroenterology

## 2022-12-13 ENCOUNTER — Ambulatory Visit (HOSPITAL_BASED_OUTPATIENT_CLINIC_OR_DEPARTMENT_OTHER): Payer: No Typology Code available for payment source | Admitting: Family Medicine

## 2022-12-14 ENCOUNTER — Encounter (HOSPITAL_BASED_OUTPATIENT_CLINIC_OR_DEPARTMENT_OTHER): Payer: Self-pay

## 2022-12-14 ENCOUNTER — Ambulatory Visit: Payer: No Typology Code available for payment source | Attending: Family Medicine

## 2022-12-14 ENCOUNTER — Other Ambulatory Visit: Payer: Self-pay

## 2022-12-14 VITALS — BP 111/72 | HR 84 | Temp 97.0°F | Wt 145.0 lb

## 2022-12-14 DIAGNOSIS — E041 Nontoxic single thyroid nodule: Secondary | ICD-10-CM | POA: Insufficient documentation

## 2022-12-14 DIAGNOSIS — E559 Vitamin D deficiency, unspecified: Secondary | ICD-10-CM | POA: Diagnosis present

## 2022-12-14 DIAGNOSIS — Z13220 Encounter for screening for lipoid disorders: Secondary | ICD-10-CM | POA: Diagnosis present

## 2022-12-14 DIAGNOSIS — K219 Gastro-esophageal reflux disease without esophagitis: Secondary | ICD-10-CM | POA: Insufficient documentation

## 2022-12-14 DIAGNOSIS — Z85118 Personal history of other malignant neoplasm of bronchus and lung: Secondary | ICD-10-CM | POA: Diagnosis present

## 2022-12-14 LAB — LIPID PANEL
Cholesterol: 235 mg/dL (ref 0–239)
HIGH DENSITY LIPOPROTEIN: 39 mg/dL — ABNORMAL LOW (ref 40–60)
LOW DENSITY LIPOPROTEIN DIRECT: 177 mg/dL (ref 0–189)
TRIGLYCERIDES: 141 mg/dL (ref 0–150)

## 2022-12-14 LAB — VITAMIN D,25 HYDROXY: VITAMIN D,25 HYDROXY: 39 ng/mL (ref 30.0–100.0)

## 2022-12-14 LAB — THYROID SCREEN TSH REFLEX FT4: THYROID SCREEN TSH REFLEX FT4: 3.27 u[IU]/mL (ref 0.270–4.200)

## 2022-12-14 NOTE — Progress Notes (Signed)
PRECEPTOR NOTE  On the day of the patient's visit, I discussed this case with the resident and reviewed findings with the resident.  I confirm the key elements of history and physical exam as described in resident's note.  I agree with the assessment and plan as described below.  Please see resident's note for further details.

## 2022-12-14 NOTE — Progress Notes (Signed)
[facility]  Office Visit Note     Subjective:     Victoria Macdonald is a 77 year old female patient who presents today for:    # Epigastric Pain   - Pain almost completely resolved with herbal supplementation   - Currently not needing omeprazole    # Mood  - stable  - no thoughts of self harm, SI  - biggest difficulty is with sleep, insomnia     Medications, Allergies, MedHx, SocHx & FamHx reviewed, current in Epic.    Objective:   BP 111/72   Pulse 84   Temp 97 F (36.1 C) (Temporal)   Wt 65.8 kg (145 lb)   LMP  (LMP Unknown)   SpO2 97%   BMI 26.52 kg/m   General: Appears stated age, in no acute distress, answers questions appropriately.  HEENT: NCAT. Clear conjunctiva, no scleral icterus. EOMI.  Neck: Supple, trachea and spine midline, normal ROM.  Lungs: Non-labored respirations.   Heart: Warm and well perfused.   MSK: Warm, moving four extremities independently.   Skin: Warm, dry, no rashes or lesions noted.  Neuro: AAO x 4. No focal deficits. Gait grossly normal.  Psych: Interactive with questions. Normal mood and affect.     Assessment & Plan:    Victoria Macdonald is a 77 year old female patient who presents today for:    1. Gastroesophageal reflux disease without esophagitis  Previously with diagnosis of Barrett's. Last seen by GI Jan 2024, with recommendation "For her upper GI symptoms, I do not think we need to do anything further.  She does not need to have regular endoscopies done." Symptoms well controlled off omeprazole at this time.     2. History of adenocarcinoma of lung  Last surveillance CT was in July 2023, patient prefers to have future surveillance with primary care.   - Message sent to thoracic surgeon regarding plan for ongoing CT surveillance     3. Thyroid nodule  93m nodule incidentally found on July 2023 CT. Will obtain TSH and ultrasound to further characterize.   - [place]; Future  - THYROID SCREEN TSH REFLEX FT4    4. Screening cholesterol level  - LIPID PANEL    5.  Vitamin D deficiency  S/p high dose supplementation this fall, will repeat level today.   - VITAMIN D,25 HYDROXY    MDamaris Schooner MD  12/14/22    1. The patient indicates understanding of these issues and agrees with the plan.  2.  The patient is given an After Visit Summary sheet that lists all of their medications with directions, their allergies, orders placed during this encounter, immunization dates, and follow- up instructions.  3. I reviewed the patient's medical information and medical history   4.  I reconciled the patient's medication list and prepared and supplied needed refills.  5.  I have reviewed the past medical, family, and social history sections including the medications and allergies listed in the above medical record

## 2022-12-18 ENCOUNTER — Other Ambulatory Visit (HOSPITAL_BASED_OUTPATIENT_CLINIC_OR_DEPARTMENT_OTHER): Payer: Self-pay | Admitting: Thoracic Surgery (Cardiothoracic Vascular Surgery)

## 2022-12-18 ENCOUNTER — Encounter (HOSPITAL_BASED_OUTPATIENT_CLINIC_OR_DEPARTMENT_OTHER): Payer: Self-pay | Admitting: Internal Medicine

## 2022-12-18 ENCOUNTER — Encounter (HOSPITAL_BASED_OUTPATIENT_CLINIC_OR_DEPARTMENT_OTHER): Payer: Self-pay

## 2022-12-18 ENCOUNTER — Other Ambulatory Visit: Payer: Self-pay

## 2022-12-18 ENCOUNTER — Ambulatory Visit (HOSPITAL_BASED_OUTPATIENT_CLINIC_OR_DEPARTMENT_OTHER): Payer: No Typology Code available for payment source | Admitting: Internal Medicine

## 2022-12-18 ENCOUNTER — Ambulatory Visit
Admission: RE | Admit: 2022-12-18 | Discharge: 2022-12-18 | Disposition: A | Discharge status: Home / Self Care | Payer: No Typology Code available for payment source | Attending: Internal Medicine | Admitting: Internal Medicine

## 2022-12-18 VITALS — BP 133/75 | HR 93 | Temp 97.6°F | Wt 146.0 lb

## 2022-12-18 DIAGNOSIS — K7460 Unspecified cirrhosis of liver: Secondary | ICD-10-CM | POA: Diagnosis present

## 2022-12-18 DIAGNOSIS — R1012 Left upper quadrant pain: Secondary | ICD-10-CM

## 2022-12-18 DIAGNOSIS — C3432 Malignant neoplasm of lower lobe, left bronchus or lung: Secondary | ICD-10-CM

## 2022-12-18 DIAGNOSIS — K7689 Other specified diseases of liver: Secondary | ICD-10-CM

## 2022-12-18 DIAGNOSIS — D734 Cyst of spleen: Secondary | ICD-10-CM | POA: Diagnosis present

## 2022-12-18 DIAGNOSIS — B181 Chronic viral hepatitis B without delta-agent: Secondary | ICD-10-CM

## 2022-12-18 DIAGNOSIS — E041 Nontoxic single thyroid nodule: Secondary | ICD-10-CM | POA: Insufficient documentation

## 2022-12-18 LAB — HEPATITIS B SURFACE AG CONFIRMATION

## 2022-12-18 LAB — ALPHA-FETOPROTEIN TUMOR MARKER: ALPHA-FETOPROTEIN TUMOR MARKER: 2.4 ng/mL (ref 0.0–8.3)

## 2022-12-18 LAB — HEPATITIS B SURFACE AB QUANT: HEPATITIS B SURFACE AB QUANT: <3.5 m[IU]/mL (ref 0.0–8.4)

## 2022-12-18 LAB — HEPATITIS B PCR QUAL TO QUANT: HEPATITIS B SURFACE ANTIGEN: REACTIVE — CR

## 2022-12-18 NOTE — Progress Notes (Addendum)
lease describe the clinical question: 31F with multiple splenic and liver cysts, negative Echinococcus Ab, e-consult placed to ID recommending in person follow-up     76 yr female comes for the above concern.    Patient says she began having pain in LUQ abd after a lung surgery in Aug 2022.  Per review she had adeno CA LLL, s/p L VATS with LLL lobectomy.    Pain described as severe and constant. Thought to be post op pain.    Eventually had an Abd [place] on 11/03/22.  Liver: Unremarkable, of normal echogenicity. There is a mostly anechoic structure measuring up to 1.1 x 0.6 x 0.8 cm within the left lobe of the liver. There are internal septations within this structure. There is a 5 x 6 mm anechoic structure within the right lobe of the liver consistent with a hepatic cyst.     Spleen: Not enlarged. Spleen measures 11.6 cm. There is a 1 cm anechoic structure within the spleen which probably represents a cyst.     Now taking some herbal medications that she feels has alleviated the pain.    Herbs- milk thistle, dandelion, one for the pancreas, Deberah Castle (per google [place] tea)    From [place]- arrived [place] [address].  Last travel to [place] was last year.  No farm exposures  No rural exposures  [place] in city  + smoking  No ETOH    Prior abdominal imaging    06/2015 CT abd/pelvis W IV contrast  The liver (multiple small hypodense  foci, most likely cysts)   No mention of the spleen    05/2018 Abd [place]  A small cyst in the left hepatic lobe measures 8 x 8 x 6 mm.   A cyst in the spleen measures 1.6 x 1.2 x 1.8 cm     07/2020 CT abd/pelvis W IV contrast  There are multiple subcentimeter focal hypodensities in the liver which are most likely cysts but too small for conclusive characterization, largest measuring 9 mm in the left lobe and correlating with the cyst seen on ultrasound.     There is a 1.8 cm hypodense lesion in the spleen which could represent a complex cyst or hypodense mass, unchanged in size from the comparison sonogram.      02/2021 Abd [place]  Mildly heterogeneous in echotexture. Stable left hepatic cyst measures 0.8 x 0.8 x 0.9 cm. No focal other mass is appreciated.     Splenic cyst measures 2.2 x 1.9 x 2.3 cm, slightly larger than on the prior study       Patient Active Problem List:     Right low back pain     Tobacco use disorder     Sessile colonic polyp     Adjustment disorder with depressed mood     Osteoporosis     GAD (generalized anxiety disorder)     Gastroesophageal reflux disease without esophagitis     Lung nodules     Adhesive capsulitis of left shoulder     Nasal congestion     HCV antibody positive     Hepatitis B surface antigen positive     H. 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  There does not seem to be a significant worsening on the serial images.  I have a page out to radiology to review the images.    I do not believe that the cysts are the cause of her abd pains.  I have a relatively low suspicion for echinococcus as she does not have rural exposure where infection is more common.  Imaging should be diagnostic and radiology did not comment that hydatid cysts were in the differential, but I will confirm.    Chronic hep B with cirrhosis  I repeated  Hep B studies today.    Has F3 on prior elastography and some periodic low platelet counts.  I discussed with her today what hep B is and the association with liver disease.  Will need lifelong [place] screening with q 6 mo liver US- will plan to add an elastography to the next liver US to estimate fibrosis    Plan f/u in 2 months to discuss my review of the radiology studies with the radiologist and hep B labs.    I spent a total of 45 minutes on this visit on the date of service (total time includes all activities performed on the date of service)      Addendum 12/21/22 - copied communication with radiology  "As you mentioned, these cysts have not changed in size or appearance since 2016. One of them is complex in the spleen and liver and they are small. These cysts are usually large and we can tell the differences between type of hydatid cysts. Its impossible in this case. Given their small size and no change - they could be complex simple cysts or adenomas. They are not hamartomas. All in all - they appear benign."

## 2022-12-18 NOTE — Progress Notes (Signed)
Patient feels physically safe at home.

## 2022-12-19 LAB — HEPATITIS B PCR QUANT
HEPATITIS B PCR IU/ML: <10 IU/mL — ABNORMAL HIGH
HEPATITIS B PCR LOG10: <1 LOG10

## 2022-12-22 LAB — HEPATITIS D TOTAL ANTIBODY: HEPATITIS D VIRUS ANTIBODY: NEGATIVE

## 2022-12-23 ENCOUNTER — Encounter (HOSPITAL_BASED_OUTPATIENT_CLINIC_OR_DEPARTMENT_OTHER): Payer: Self-pay

## 2022-12-31 ENCOUNTER — Other Ambulatory Visit (HOSPITAL_BASED_OUTPATIENT_CLINIC_OR_DEPARTMENT_OTHER): Payer: Self-pay | Admitting: Family Medicine

## 2022-12-31 NOTE — Telephone Encounter (Signed)
PER Pharmacy, Victoria Macdonald is a 77 year old female has requested a refill of  - Allegra    Last Office Visit: 12/14/22 with pcp      There are no preventive care reminders to display for this patient.  Other Med Adult:  Most Recent BP Reading(s)  12/18/22 : 133/75        Cholesterol (mg/dL)   Date Value   12/14/2022 235     LOW DENSITY LIPOPROTEIN DIRECT (mg/dL)   Date Value   12/14/2022 177     HIGH DENSITY LIPOPROTEIN (mg/dL)   Date Value   12/14/2022 39 (L)     TRIGLYCERIDES (mg/dL)   Date Value   12/14/2022 141         THYROID SCREEN TSH REFLEX FT4 (uIU/mL)   Date Value   12/14/2022 3.270         No results found for: "TSH"    HEMOGLOBIN A1C (%)   Date Value   08/03/2022 5.5       No results found for: "POCA1C"      INR (no units)   Date Value   04/27/2021 1.0   04/30/2018 1.0       SODIUM (mmol/L)   Date Value   08/03/2022 142       POTASSIUM (mmol/L)   Date Value   08/03/2022 4.5           CREATININE (mg/dL)   Date Value   08/03/2022 0.8     Documented patient preferred pharmacies:    [id number] [address] [place] [address] Phone: [phone]: [phone]

## 2023-01-01 ENCOUNTER — Other Ambulatory Visit (HOSPITAL_BASED_OUTPATIENT_CLINIC_OR_DEPARTMENT_OTHER): Payer: Self-pay | Admitting: Family Medicine

## 2023-01-01 DIAGNOSIS — J310 Chronic rhinitis: Secondary | ICD-10-CM

## 2023-01-01 NOTE — Telephone Encounter (Signed)
PER Pharmacy, Victoria Macdonald is a 77 year old female has requested a refill of      -  Flonase       Last Office Visit: 12/14/2022 with [name]  Last Physical Exam: 09/27/2017     There are no preventive care reminders to display for this patient.     Other Med Adult:  Most Recent BP Reading(s)  12/18/22 : 133/75        Cholesterol (mg/dL)   Date Value   12/14/2022 235     LOW DENSITY LIPOPROTEIN DIRECT (mg/dL)   Date Value   12/14/2022 177     HIGH DENSITY LIPOPROTEIN (mg/dL)   Date Value   12/14/2022 39 (L)     TRIGLYCERIDES (mg/dL)   Date Value   12/14/2022 141         THYROID SCREEN TSH REFLEX FT4 (uIU/mL)   Date Value   12/14/2022 3.270         No results found for: "TSH"    HEMOGLOBIN A1C (%)   Date Value   08/03/2022 5.5       No results found for: "POCA1C"      INR (no units)   Date Value   04/27/2021 1.0   04/30/2018 1.0       SODIUM (mmol/L)   Date Value   08/03/2022 142       POTASSIUM (mmol/L)   Date Value   08/03/2022 4.5           CREATININE (mg/dL)   Date Value   08/03/2022 0.8        Documented patient preferred pharmacies:    [id number] [place] [address] Phone: [phone]: [phone]

## 2023-01-06 ENCOUNTER — Other Ambulatory Visit (HOSPITAL_BASED_OUTPATIENT_CLINIC_OR_DEPARTMENT_OTHER): Payer: Self-pay | Admitting: Physician Assistant

## 2023-01-06 NOTE — Telephone Encounter (Signed)
PER Pharmacy, Victoria Macdonald is a 77 year old female has requested a refill of diclofenac 75.      Last Office Visit: 10-04-22 with taylor hoff  Last Physical Exam: 09-27-17    There are no preventive care reminders to display for this patient.    Other Med Adult:  Most Recent BP Reading(s)  12/18/22 : 133/75        Cholesterol (mg/dL)   Date Value   12/14/2022 235     LOW DENSITY LIPOPROTEIN DIRECT (mg/dL)   Date Value   12/14/2022 177     HIGH DENSITY LIPOPROTEIN (mg/dL)   Date Value   12/14/2022 39 (L)     TRIGLYCERIDES (mg/dL)   Date Value   12/14/2022 141         THYROID SCREEN TSH REFLEX FT4 (uIU/mL)   Date Value   12/14/2022 3.270         No results found for: "TSH"    HEMOGLOBIN A1C (%)   Date Value   08/03/2022 5.5       No results found for: "POCA1C"      INR (no units)   Date Value   04/27/2021 1.0   04/30/2018 1.0       SODIUM (mmol/L)   Date Value   08/03/2022 142       POTASSIUM (mmol/L)   Date Value   08/03/2022 4.5           CREATININE (mg/dL)   Date Value   08/03/2022 0.8       Documented patient preferred pharmacies:    [id number] [address] [place]  Phone: [phone] Fax: [phone]

## 2023-02-12 ENCOUNTER — Telehealth (HOSPITAL_BASED_OUTPATIENT_CLINIC_OR_DEPARTMENT_OTHER): Payer: Self-pay

## 2023-02-12 NOTE — Telephone Encounter (Signed)
Called and spoke to pt. Pt states she experienced very slight dizziness and headache on 4/18. Reports h/a was a 2/10 and states all over head. Pt checked BP twice 80's/50's. Pt states she rested for the remainder of the day. The next day bp was 130/87. 4/20 BP 105/71 and 4/21 BP 115/77. Pt is concerned about BP being "all over the place". Offered pt next available appointment, pt declined. Scheduled pt per request. Advised pt if dizziness or headache returns to go to nearest ER, locations discussed. Pt verbalizes understanding and agrees with plan.

## 2023-02-12 NOTE — Telephone Encounter (Signed)
Victoria Macdonald [id number], 77 year old, female    Calls today: that her BP keep going up and down.     Person calling on behalf of patient: Patient (self)    Cleotis Lema NUMBER: [phone]       Patient's language of care: English        Patient's PCP: Royann Shivers, MD    Primary Care Home Site:  [facility]

## 2023-02-16 ENCOUNTER — Encounter (HOSPITAL_BASED_OUTPATIENT_CLINIC_OR_DEPARTMENT_OTHER): Payer: Self-pay

## 2023-02-16 DIAGNOSIS — G8912 Acute post-thoracotomy pain: Secondary | ICD-10-CM

## 2023-02-19 ENCOUNTER — Other Ambulatory Visit: Payer: Self-pay

## 2023-02-19 ENCOUNTER — Ambulatory Visit: Payer: No Typology Code available for payment source | Attending: Internal Medicine | Admitting: Internal Medicine

## 2023-02-19 ENCOUNTER — Ambulatory Visit
Admission: RE | Admit: 2023-02-19 | Discharge: 2023-02-19 | Disposition: A | Discharge status: Home / Self Care | Payer: No Typology Code available for payment source | Attending: Student in an Organized Health Care Education/Training Program | Admitting: Student in an Organized Health Care Education/Training Program

## 2023-02-19 ENCOUNTER — Encounter (HOSPITAL_BASED_OUTPATIENT_CLINIC_OR_DEPARTMENT_OTHER): Payer: Self-pay | Admitting: Internal Medicine

## 2023-02-19 VITALS — BP 162/82 | HR 92 | Temp 97.0°F | Wt 145.8 lb

## 2023-02-19 DIAGNOSIS — K7460 Unspecified cirrhosis of liver: Secondary | ICD-10-CM | POA: Insufficient documentation

## 2023-02-19 DIAGNOSIS — B181 Chronic viral hepatitis B without delta-agent: Secondary | ICD-10-CM | POA: Insufficient documentation

## 2023-02-19 DIAGNOSIS — C3432 Malignant neoplasm of lower lobe, left bronchus or lung: Secondary | ICD-10-CM

## 2023-02-19 DIAGNOSIS — C3490 Malignant neoplasm of unspecified part of unspecified bronchus or lung: Secondary | ICD-10-CM | POA: Diagnosis not present

## 2023-02-19 LAB — POC CREATININE
POC CREATININE: 0.9 mg/dl (ref 0.4–1.2)
POC GFR: >60 mL/min (ref 60–?)

## 2023-02-19 MED ORDER — IOHEXOL 350 MG/ML IV SOLN
55.00 mL | Freq: Once | INTRAVENOUS | Status: AC
Start: 2023-02-19 — End: 2023-02-19
  Administered 2023-02-19: 55 mL via INTRAVENOUS

## 2023-02-19 NOTE — Progress Notes (Signed)
Patient feels physically safe at home.[place]

## 2023-02-19 NOTE — Progress Notes (Signed)
Patient comes for f/u.    Initially referred for concerns of echinococcus.  See my initial consult note.  Not thought to have echinococcus in spleen or liver.  Stable cysts.    However she does have chronic hep B.  Recent labs show god immune control with neg HBV PCR.      BP 162/82 (Site: RA, Position: Sitting, Cuff Size: Reg)   Pulse 92   Temp 97 F (36.1 C) (Temporal)   Wt 66.1 kg (145 lb 12.8 oz)   LMP  (LMP Unknown)   SpO2 98%   BMI 26.67 kg/m   Deferred    A/P  (B18.1) Chronic viral hepatitis B without delta agent and without coma (HCC)  Comment: discussed importance of lifelong monitoring.  No need for antiviral tx    (K74.60) Cirrhosis of liver without ascites, unspecified hepatic cirrhosis type (HCC)  Dx by prior elastography in 2019  Comment: US ABDOMEN COMPLETE, US LIVER ELASTOGRAPHY          F/u with me at the end of July for repeat labs and ongoing monitoring.    I spent a total of 30 minutes on this visit on the date of service (total time includes all activities performed on the date of service)

## 2023-02-21 ENCOUNTER — Encounter (HOSPITAL_BASED_OUTPATIENT_CLINIC_OR_DEPARTMENT_OTHER): Payer: Self-pay

## 2023-02-21 ENCOUNTER — Other Ambulatory Visit: Payer: Self-pay

## 2023-02-21 ENCOUNTER — Ambulatory Visit: Payer: No Typology Code available for payment source | Attending: Family Medicine | Admitting: Family Medicine

## 2023-02-21 VITALS — BP 137/82 | HR 93 | Temp 96.0°F | Wt 146.0 lb

## 2023-02-21 DIAGNOSIS — K219 Gastro-esophageal reflux disease without esophagitis: Secondary | ICD-10-CM | POA: Insufficient documentation

## 2023-02-21 DIAGNOSIS — K5730 Diverticulosis of large intestine without perforation or abscess without bleeding: Secondary | ICD-10-CM | POA: Insufficient documentation

## 2023-02-21 DIAGNOSIS — K5904 Chronic idiopathic constipation: Secondary | ICD-10-CM | POA: Insufficient documentation

## 2023-02-21 DIAGNOSIS — R1012 Left upper quadrant pain: Secondary | ICD-10-CM | POA: Diagnosis present

## 2023-02-21 DIAGNOSIS — G8912 Acute post-thoracotomy pain: Secondary | ICD-10-CM | POA: Insufficient documentation

## 2023-02-21 DIAGNOSIS — R030 Elevated blood-pressure reading, without diagnosis of hypertension: Secondary | ICD-10-CM | POA: Diagnosis present

## 2023-02-21 MED ORDER — DICLOFENAC SODIUM 3 % EX GEL
2.00 g | Freq: Four times a day (QID) | CUTANEOUS | 3 refills | Status: AC | PRN
Start: 2023-02-21 — End: 2024-02-21

## 2023-02-21 MED ORDER — DICLOFENAC SODIUM 3 % EX GEL
2.0000 g | Freq: Four times a day (QID) | CUTANEOUS | 3 refills | Status: DC | PRN
Start: 2023-02-21 — End: 2023-02-21

## 2023-02-21 MED ORDER — DICLOFENAC SODIUM 3 % EX GEL
2.0000 g | Freq: Four times a day (QID) | CUTANEOUS | 0 refills | Status: DC | PRN
Start: 2023-02-21 — End: 2023-02-21

## 2023-02-21 NOTE — Progress Notes (Signed)
[facility] FAMILY MEDICINE  Office Visit Note     Subjective:   Victoria Macdonald is a 77 year old female patient who presents today for: Patient presents with:  Blood Pressure      #BP  Was concerned because her BP recently has been "all over the place:    #Abdominal Pain  #Diverticulitis  Having some pain in the epigastrium to left upper quadrant  Hx of GERD  No nausea or vomiting  Sometimes constipation  Depending on what she eats, makes her constipated  Saw GI in January and recommended fruits, veggies, high fiber for diverticulitis  Has been going on for about 3 years, has worse in the last year  Has been changing her diet since she discovered she has diverticulitis  Whenever she gets constipation she eats salad    #Hx Adeno carcinoma   Of LLL s/p VATS with LLL lobectomy  Gets pain in left ribcage sometimes because she was told they hit a nerve and causes some pain    #Parasthesias  Uses diclofenac for this    No interpreter was required during this encounter.     ROS:  Review of systems conducted and pertinent review of systems as noted in HPI.    Patient Active Problem List:     Right low back pain     Tobacco use disorder     Sessile colonic polyp     Adjustment disorder with depressed mood     Osteoporosis     GAD (generalized anxiety disorder)     Gastroesophageal reflux disease without esophagitis     Lung nodules     Adhesive capsulitis of left shoulder     Nasal congestion     HCV antibody positive     Hepatitis B surface antigen positive     H. pylori infection     Postmenopausal bleeding     Pseudophakia of both eyes     Astigmatism of both eyes with presbyopia     Dry eye     Squamous blepharitis of upper and lower eyelids of both eyes     Arthralgia     Urinary incontinence, nocturnal enuresis     Diverticulosis of large intestine     Barrett's esophagus     Mild major depression (HCC)      MEDS:    Current Outpatient Medications:     fexofenadine (ALLEGRA) 180 MG tablet, TAKE 1 TABLET BY MOUTH EVERY  DAY, Disp: 90 tablet, Rfl: 3    fluticasone (FLONASE) 50 MCG/ACT nasal spray, SPRAY 1 SPRAY INTO EACH NOSTRIL IN THE MORNING, Disp: 32 mL, Rfl: 1    omeprazole (PRILOSEC) 40 MG capsule, Take 1 capsule by mouth every morning, Disp: 90 capsule, Rfl: 3    ergocalciferol (VITAMIN D2) 50000 UNIT capsule, Take 1 capsule by mouth once a week  for 8 doses, Disp: 8 capsule, Rfl: 0    diclofenac sodium 3 % gel, Apply 2 g topically 4 (four) times daily as needed, Disp: 300 g, Rfl: 3    montelukast (SINGULAIR) 10 MG tablet, TAKE 1 TABLET BY MOUTH EVERY DAY IN THE EVENING, Disp: 90 tablet, Rfl: 3    Review of Patient's Allergies indicates:   Lactose intolerance*    Other (See Comments)    Comment:bloating    Objective:   BP 137/82   Pulse 93   Temp 96 F (35.6 C) (Temporal)   Wt 66.2 kg (146 lb)   LMP  (LMP Unknown)  SpO2 97%   BMI 26.70 kg/m   General: No acute distress, well appearing.  GI: Soft, non-tender, non-distended. Bowel sounds normal. No masses or organomegaly.    Assessment & Plan:    Victoria Macdonald is a 77 year old female who presented to clinic today for Patient presents with:  Blood Pressure     1. Left upper quadrant pain  2. Diverticulosis of large intestine without hemorrhage  3. Functional constipation  4. Gastroesophageal reflux disease, unspecified whether esophagitis present  76yoF with known GERD/gastritis and diverticulitis up to splenic flecture who is presenting with intermittent colicky left upper quadrant abdominal pain and epigastric abdominal pain without vomiting or diarrhea. Benign abdominal exam. Discussed it is likely related to her diverticulitis and she also reports intermittent constipation. We reviewed the diverticulitis diet including avoiding seeds and nuts and the importance of very high fiber intake. Pt reports her sx often improve with a large salad and she would prefer to avoid medications for constipation at this time. Agrees to continue PPI for gastritis treatment and  adhere to diverticulitis diet and would prefer to try this prior to considering medications. F/u PRN.    5. Post-thoracotomy pain  Hx of adenocarcinoma s/p lobectomy with residual pain. Refilled diclofenac and discussed appropriate use.  - diclofenac sodium 3 % gel; Apply 2 g topically 4 (four) times daily as needed  Dispense: 300 g; Refill: 3    6. Elevated BP without diagnosis of hypertension  Pt with two elevated BPs in the last few years, once prior to her surgery in 2022 and once two days ago at specialist appointment. Her BP today is normal and she is not on medications. We discussed we can likely monitor for now and not start medications unless it becomes a trend. Pt in agreement.    I spent a total of 41 minutes on this visit on the date of service (total time includes all activities performed on the date of service)      I have reviewed the past medical, family, and social history sections including the medications and allergies listed in the above medical record. I reconciled the patient's medication list and prepared and supplied needed refills. The patient indicates understanding of the above issues and agrees with the plan. The patient has been given an After Visit Summary sheet that lists all of their medications with directions, their allergies, orders placed during this encounter, immunization dates, and follow- up instructions.      Alphia Kava, MD, 02/21/2023

## 2023-03-09 IMAGING — MR RM - [facility]
3 of 7 series · 8 of 48 positions shown · non-contrast
Comparison: none

------------- REPORT [id number] -------------
LUMBOSACRAL SPINE MRI
TECHNIQUE: FSE (fast-spin-echo) sequences, T1 and T2 weighted, were performed in the sagittal and axial planes.

[Series 401: T2 · sagittal · 4.0mm · 0.38mm/px · 3 of 30 slices shown (1 of 3)]
[im 4/30]
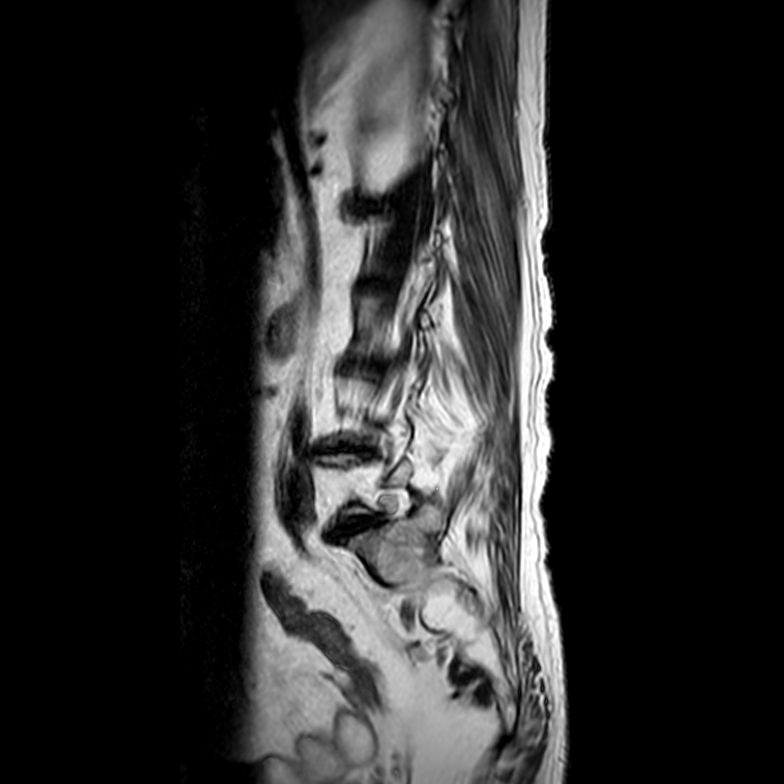
[im 17/30]
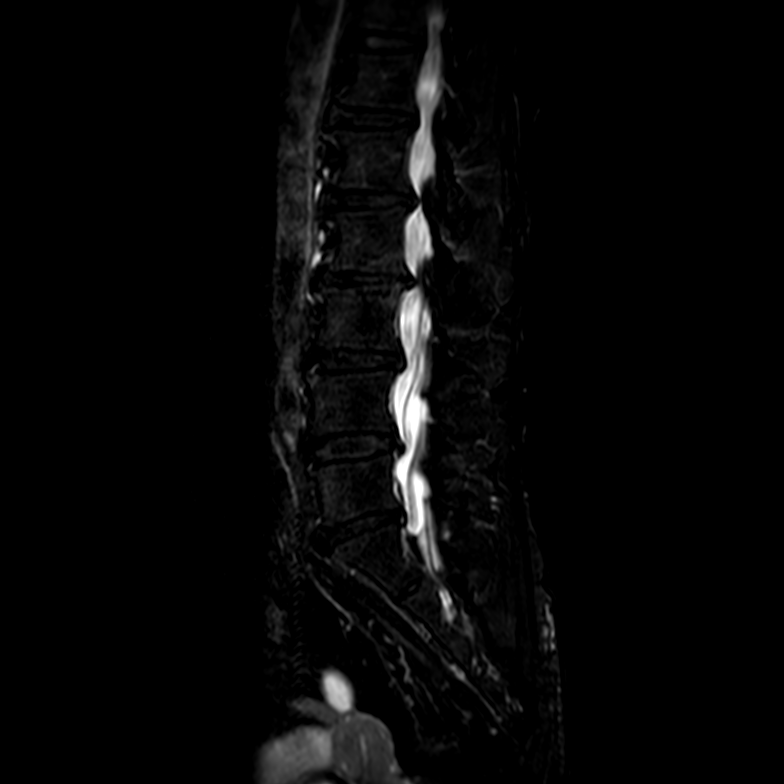
[im 26/30]
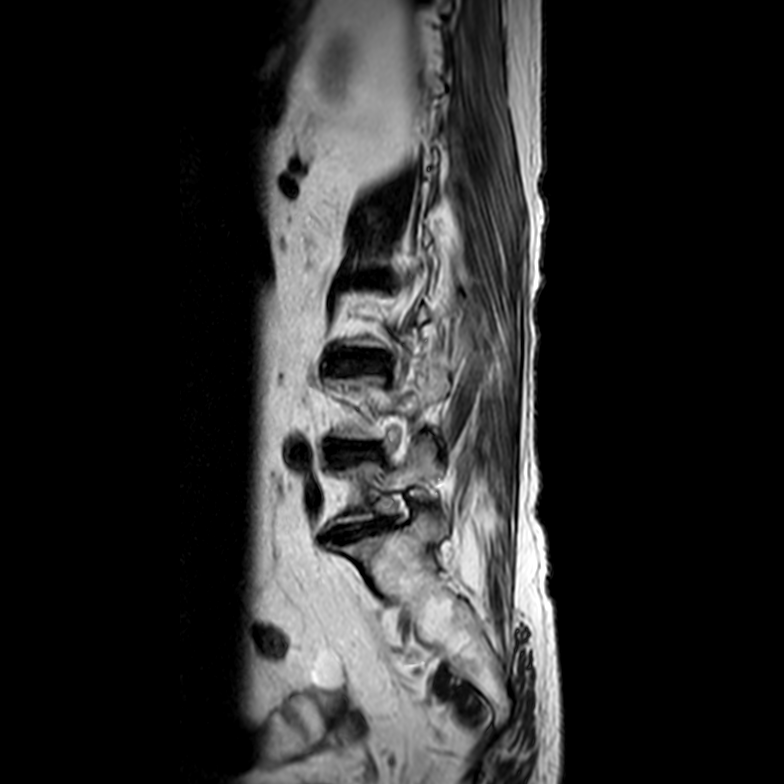

[Series 402: T2 · sagittal · 4.0mm · 0.38mm/px · 3 of 15 slices shown (2 of 3)]
[im 1/15]
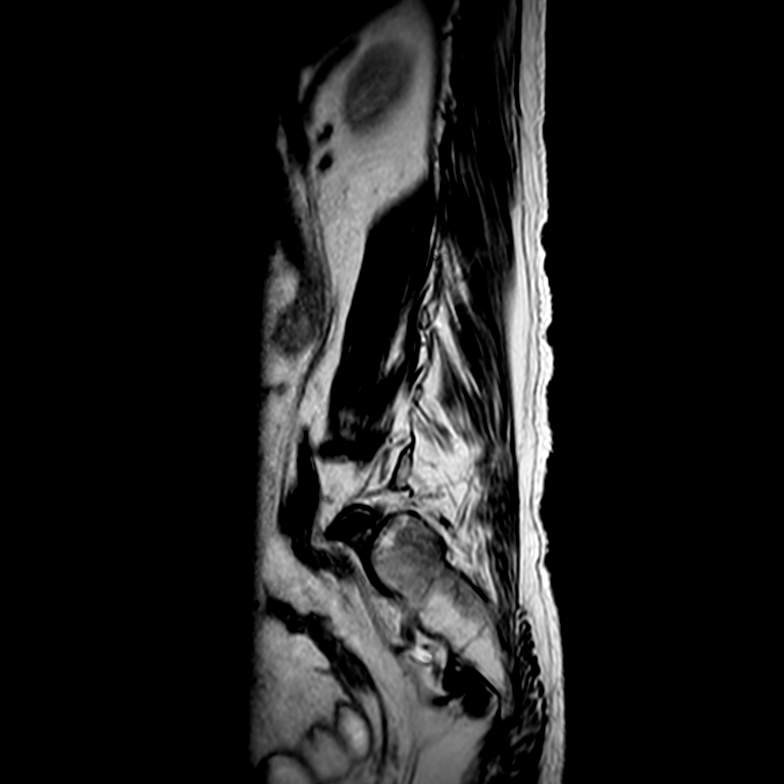
[im 8/15]
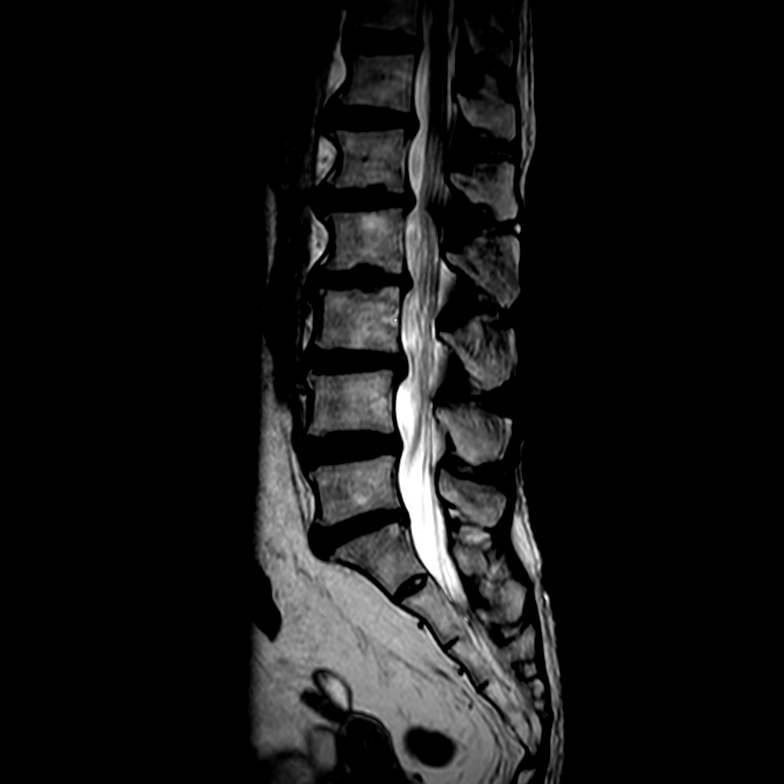
[im 15/15]
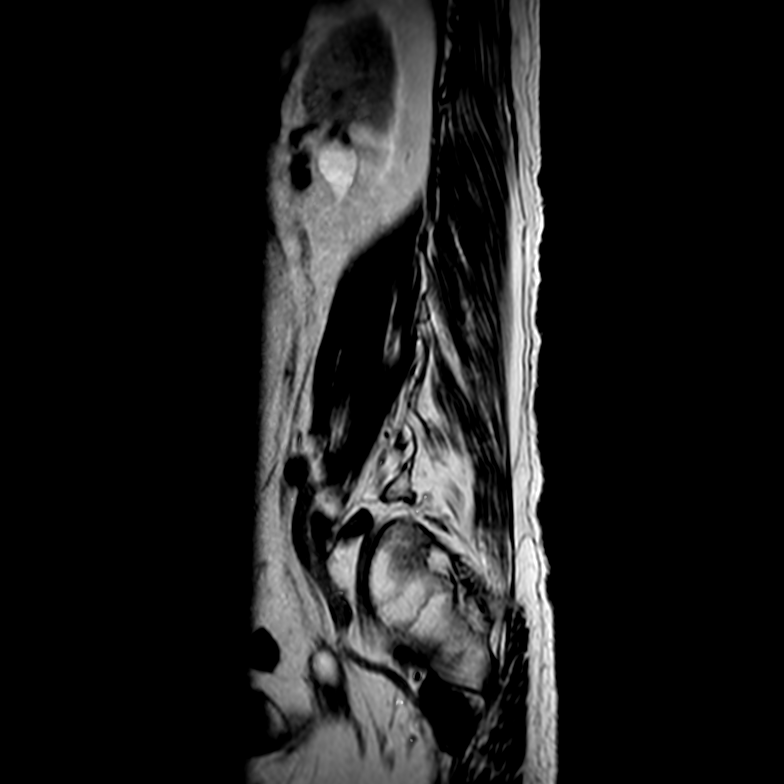

[Series 403: T2 · sagittal · 4.0mm · 0.38mm/px · 2 of 15 slices shown (3 of 3)]
[im 1/15]
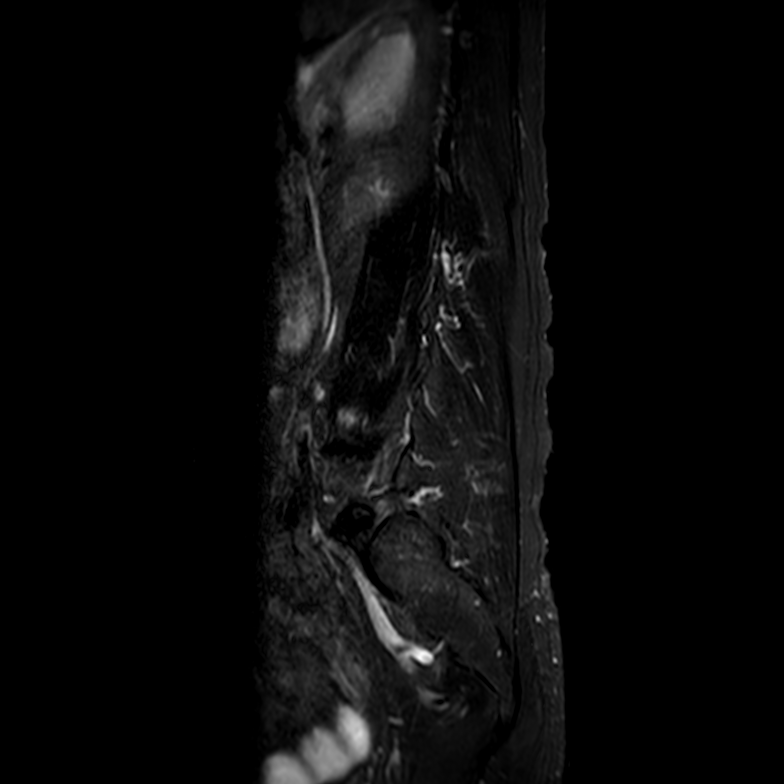
[im 8/15]
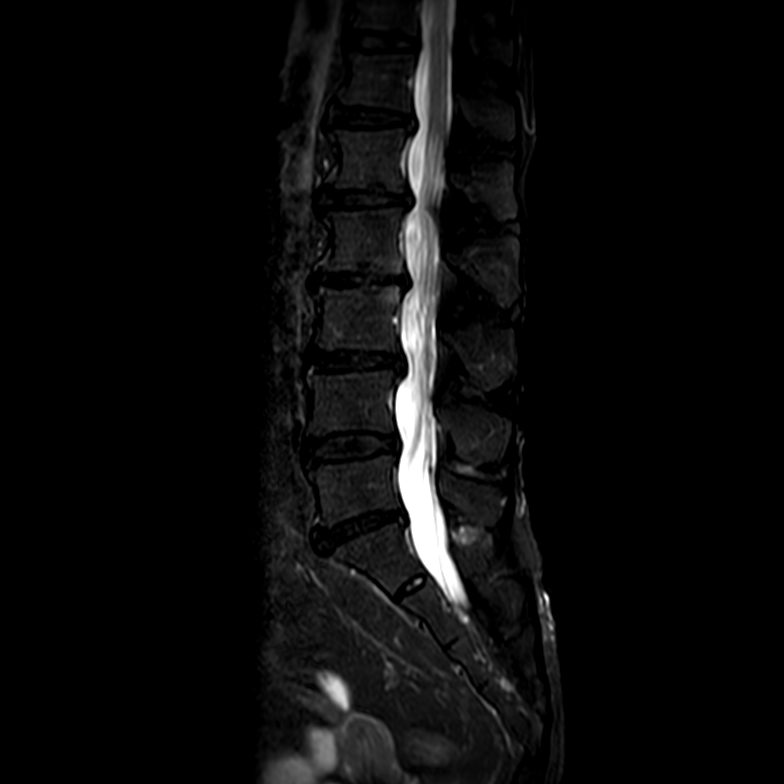

[8 of 48 positions shown; findings below may reference images not displayed]

RESULT:
Good alignment of the lumbar vertebral bodies.
Vertebral bodies with preserved heights.
Osteophytosis in the lumbar vertebral bodies.
Pedicles, laminae, transverse and spinous processes preserved.
Diffuse facet joint arthrosis.
Foci of hypersignal in T1 and T2 scattered throughout the vertebral bodies, possibly corresponding to hemangiomas or even focal fatty deposits.
Schmorl's nodes scattered throughout the vertebral endplates.
Signs of diffuse disc dehydration.

L1-L2 level: Symmetrical disc bulging, straightening the ventral aspect of the dural sac, without radicular conflict.
L2-L3 level: Asymmetrical disc bulging with a larger component on the left, straightening the ventral aspect of the dural sac and touching the emerging left L2 nerve root.
L3-L4 level: Asymmetrical disc bulging with a larger component on the right, straightening the ventral aspect of the dural sac and reducing the foraminal amplitudes, touching the emerging L3 and descending L4 nerve roots.
L4-L5 level: Symmetrical disc bulging, straightening the ventral aspect of the dural sac and reducing the foraminal amplitudes, touching the emerging L4 nerve roots.
L5-S1 level: Symmetrical disc bulging presenting fissure of the annulus fibrosus, straightening the ventral aspect of the dural sac and reducing the foraminal amplitudes, touching the emerging L5 nerve roots.

Remaining foraminal nerve roots preserved.
Medullary cone with regular contours and homogeneous signal.
Paravertebral soft tissues with preserved appearance.
CONCLUSION: Lumbar spondyloarthropathy.
Degenerative disc disease as described above.


------------- REPORT [id number] -------------
LUMBOSACRAL SPINE MRI
RESULT:
Good alignment of the lumbar vertebral bodies.
Vertebral bodies with preserved heights.
Osteophytosis in the lumbar vertebral bodies.
Pedicles, laminae, transverse and spinous processes preserved.
Diffuse interapophyseal joint arthrosis.
Foci of hypersignal in T1 and T2 scattered throughout the vertebral bodies, possibly corresponding to hemangiomas or even focal fatty deposits.
Schmorl's nodes scattered throughout the vertebral plateaus.
Signs of diffuse disc dehydration.

Level L1-L2: Symmetrical disc bulging, straightening the ventral aspect of the dural sac, without radicular conflicts.
Level L2-L3: Asymmetrical disc bulging with a larger component on the left, straightening the ventral aspect of the dural sac and touching the left emerging root of L2.
Level L3-L4: Asymmetrical disc bulging with a larger component on the right, straightening the ventral aspect of the dural sac and reducing the foraminal amplitudes, touching the emerging roots of L3 and descending roots of L4.
Level L4-L5: Symmetrical disc bulging, straightening the ventral aspect of the dural sac and reducing the foraminal amplitudes, touching the emerging roots of L4.
Level L5-S1: Symmetrical disc bulging presenting a fissure of the annulus fibrosus, straightening the ventral aspect of the dural sac and reducing the foraminal amplitudes, touching the emerging roots of L5.

Remaining foraminal nerve roots preserved.
Medullary cone with regular contours and homogeneous signal.
Paravertebral soft tissues with preserved appearance.
CONCLUSION: Lumbar degenerative spondyloarthropathy.
Degenerative disc disease described above.

## 2023-04-15 ENCOUNTER — Encounter (HOSPITAL_BASED_OUTPATIENT_CLINIC_OR_DEPARTMENT_OTHER): Payer: Self-pay | Admitting: Gastroenterology

## 2023-04-15 ENCOUNTER — Other Ambulatory Visit (HOSPITAL_BASED_OUTPATIENT_CLINIC_OR_DEPARTMENT_OTHER): Payer: Self-pay

## 2023-04-15 DIAGNOSIS — K2270 Barrett's esophagus without dysplasia: Secondary | ICD-10-CM

## 2023-04-15 NOTE — Telephone Encounter (Signed)
active refill at the pharmacy

## 2023-05-07 ENCOUNTER — Ambulatory Visit
Admission: RE | Admit: 2023-05-07 | Discharge: 2023-05-07 | Disposition: A | Discharge status: Home / Self Care | Payer: No Typology Code available for payment source | Attending: Diagnostic Radiology | Admitting: Diagnostic Radiology

## 2023-05-07 ENCOUNTER — Other Ambulatory Visit (HOSPITAL_BASED_OUTPATIENT_CLINIC_OR_DEPARTMENT_OTHER): Payer: No Typology Code available for payment source

## 2023-05-07 ENCOUNTER — Other Ambulatory Visit: Payer: Self-pay

## 2023-05-07 DIAGNOSIS — K769 Liver disease, unspecified: Secondary | ICD-10-CM | POA: Diagnosis not present

## 2023-05-07 DIAGNOSIS — K7401 Hepatic fibrosis, early fibrosis: Secondary | ICD-10-CM | POA: Diagnosis present

## 2023-05-07 DIAGNOSIS — B181 Chronic viral hepatitis B without delta-agent: Secondary | ICD-10-CM

## 2023-05-07 DIAGNOSIS — B1910 Unspecified viral hepatitis B without hepatic coma: Secondary | ICD-10-CM | POA: Diagnosis present

## 2023-05-07 NOTE — Progress Notes (Signed)
Results reviewed.  Will discuss at f/u visit.  Has an apt scheduled with me 05/18/23.  Cornelius Moras, MD

## 2023-05-09 ENCOUNTER — Other Ambulatory Visit (HOSPITAL_BASED_OUTPATIENT_CLINIC_OR_DEPARTMENT_OTHER): Payer: Self-pay | Admitting: Family Medicine

## 2023-05-09 DIAGNOSIS — R0981 Nasal congestion: Secondary | ICD-10-CM

## 2023-05-09 NOTE — Telephone Encounter (Signed)
PER Patient (self), Victoria Macdonald is a 77 year old female has requested a refill of    SINGULAIR       Last Office Visit: 02/21/2023   With Alphia Kava, MD      Last Physical Exam:09/2017    There are no preventive care reminders to display for this patient.    Other Med Adult:  Most Recent BP Reading(s)  02/21/23 : 137/82        Cholesterol (mg/dL)   Date Value   16/07/9603 235     LOW DENSITY LIPOPROTEIN DIRECT (mg/dL)   Date Value   54/06/8118 177     HIGH DENSITY LIPOPROTEIN (mg/dL)   Date Value   14/78/2956 39 (L)     TRIGLYCERIDES (mg/dL)   Date Value   21/30/8657 141         THYROID SCREEN TSH REFLEX FT4 (uIU/mL)   Date Value   12/14/2022 3.270         No results found for: "TSH"    HEMOGLOBIN A1C (%)   Date Value   08/03/2022 5.5       No results found for: "POCA1C"      INR (no units)   Date Value   04/27/2021 1.0   04/30/2018 1.0       SODIUM (mmol/L)   Date Value   08/03/2022 142       POTASSIUM (mmol/L)   Date Value   08/03/2022 4.5           CREATININE (mg/dL)   Date Value   84/69/6295 0.8     POC CREATININE (mg/dl)   Date Value   28/41/3244 0.9       Documented patient preferred pharmacies:    [id number] [address] [address], [place] - [address]  Phone: [phone] Fax: [phone]

## 2023-05-18 ENCOUNTER — Other Ambulatory Visit: Payer: Self-pay

## 2023-05-18 ENCOUNTER — Other Ambulatory Visit (HOSPITAL_BASED_OUTPATIENT_CLINIC_OR_DEPARTMENT_OTHER): Payer: Self-pay

## 2023-05-18 ENCOUNTER — Ambulatory Visit
Admission: RE | Admit: 2023-05-18 | Discharge: 2023-05-18 | Disposition: A | Discharge status: Home / Self Care | Payer: No Typology Code available for payment source | Attending: Internal Medicine | Admitting: Internal Medicine

## 2023-05-18 ENCOUNTER — Ambulatory Visit (HOSPITAL_BASED_OUTPATIENT_CLINIC_OR_DEPARTMENT_OTHER): Payer: No Typology Code available for payment source | Admitting: Internal Medicine

## 2023-05-18 VITALS — BP 151/79 | HR 87 | Temp 96.8°F | Wt 144.0 lb

## 2023-05-18 DIAGNOSIS — F320 Major depressive disorder, single episode, mild: Secondary | ICD-10-CM

## 2023-05-18 DIAGNOSIS — K769 Liver disease, unspecified: Secondary | ICD-10-CM | POA: Insufficient documentation

## 2023-05-18 DIAGNOSIS — B181 Chronic viral hepatitis B without delta-agent: Secondary | ICD-10-CM | POA: Insufficient documentation

## 2023-05-18 LAB — ALPHA-FETOPROTEIN TUMOR MARKER: ALPHA-FETOPROTEIN TUMOR MARKER: 2 ng/mL (ref 0.0–8.3)

## 2023-05-18 LAB — ALANINE AMINOTRANSFERASE: ALANINE AMINOTRANSFERASE: 9 U/L — ABNORMAL LOW (ref 12–45)

## 2023-05-18 LAB — VITAMIN B12: VITAMIN B12: 519 pg/mL (ref 232–1245)

## 2023-05-18 LAB — FERRITIN: FERRITIN: 59 ng/mL (ref 13–150)

## 2023-05-18 NOTE — Progress Notes (Signed)
Patient comes for f/u Hep B.      Labs    Feb 2024: remains SAg + but neg PCR, hep D neg, plt nrl, AFP low  Nov 2023: HFTs nrl    Recent elastography in July 2024  METAVIR Stage F1/F2 (1.2-1.6 m/sec)     The [place] mentions  "There is a new 5 mm hyperechoic lesion in the liver. The sonographic characteristics suggest a hemangioma. However, given the patient's history of hepatitis B, obtaining an MRI of the abdomen for better characterization should be considered. The small size of the lesion may make the lesion difficult to visualize on MRI and the lesion could be followed up by ultrasound if not seen on the MRI."    Regarding depression and anxiety, overall she feels tired of all the medical issues.  In the past she has felt unheard by the medical system, esp in regards to abd pains and h/o diverticulosis.  Remains frustrated by this.    Requesting vit D, Vit b12, iron studies- reason requested is just to look for inflammation in the body.       Medication List            Accurate as of May 18, 2023 10:04 AM. If you have any questions, ask your nurse or doctor.                CONTINUE taking these medications      diclofenac sodium 3 % gel  Apply 2 g topically 4 (four) times daily as needed     ergocalciferol 50000 UNIT capsule  Commonly known as: VITAMIN D2  Take 1 capsule by mouth once a week  for 8 doses     fexofenadine 180 MG tablet  Commonly known as: ALLEGRA  TAKE 1 TABLET BY MOUTH EVERY DAY     fluticasone 50 MCG/ACT nasal spray  Commonly known as: FLONASE  SPRAY 1 SPRAY INTO EACH NOSTRIL IN THE MORNING     montelukast 10 MG tablet  Commonly known as: SINGULAIR  TAKE 1 TABLET BY MOUTH EVERY DAY IN THE EVENING     omeprazole 40 MG capsule  Commonly known as: PriLOSEC  Take 1 capsule by mouth every morning              BP 151/79 (Site: RA, Position: Sitting, Cuff Size: Reg)   Pulse 87   Temp 96.8 F (36 C) (Temporal)   Wt 65.3 kg (144 lb)   LMP  (LMP Unknown)   SpO2 97%   BMI 26.34 kg/m    Abd soft, mild diffuse tenderness but no rebound or guarding  No LE swelling  No icterus    A/P  (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  Discussed today.  She feels stable. More frustrated with the medical system  Plan: continue to monitor    Request for vit D, vit B12, Fe tests - will message PCP who can add them on to today's labs if appropriate.    I spent a total of 30 minutes on this visit on the date of service (total time includes all activities performed on the date of service)    F/u q 6 mo

## 2023-05-21 ENCOUNTER — Ambulatory Visit (HOSPITAL_BASED_OUTPATIENT_CLINIC_OR_DEPARTMENT_OTHER): Payer: No Typology Code available for payment source | Admitting: Internal Medicine

## 2023-05-21 LAB — HEPATITIS B PCR QUANT
HEPATITIS B PCR IU/ML: <10 IU/mL — ABNORMAL HIGH
HEPATITIS B PCR LOG10: <1 LOG10

## 2023-06-13 ENCOUNTER — Ambulatory Visit: Payer: No Typology Code available for payment source | Admitting: Thoracic Surgery (Cardiothoracic Vascular Surgery)

## 2023-06-13 DIAGNOSIS — C3432 Malignant neoplasm of lower lobe, left bronchus or lung: Secondary | ICD-10-CM | POA: Diagnosis not present

## 2023-06-13 MED ORDER — GABAPENTIN 300 MG PO CAPS
ORAL_CAPSULE | ORAL | 0 refills | Status: AC
Start: 2023-06-13 — End: 2023-07-27

## 2023-06-13 NOTE — Progress Notes (Signed)
THORACIC SURGERY CLINIC FOLLOW UP    CC:    Oncologic history:     Status post VATS LLLy at [facility] in Jun 20, 2021 for a growing LLL 2 cm  nodule, final path pT1bN0M0R0, stage I    HPI:    21F with a history of lung cancer as above. She still notes a lot of pain in her thorax since surgery.  She is not taking anything for that and has not ever been prescribed neurontin.      New Imaging:    02/19/23 CT chest wo contrast: NED    A/P:    Pt is a 77 year old female with the above history of lung cancer.    CT chest and fu in Oct 2024    Try gabapentin, 300 mg HS x 1 week, then bid x 1 week, then tid.     Telehealth visit in 1 month to see how the gaba is working.    Duke Salvia, MD  Thoracic Surgery

## 2023-06-13 NOTE — Patient Instructions (Signed)
I have prescribed you a medication called gabapentin for pain. It can cause drowsiness. If you feel drowsy while taking it you may need to stop it.  Follow up in 1 month to see how it is working for you by phone.

## 2023-06-27 ENCOUNTER — Other Ambulatory Visit (HOSPITAL_BASED_OUTPATIENT_CLINIC_OR_DEPARTMENT_OTHER): Payer: No Typology Code available for payment source

## 2023-06-28 ENCOUNTER — Other Ambulatory Visit (HOSPITAL_BASED_OUTPATIENT_CLINIC_OR_DEPARTMENT_OTHER): Payer: Self-pay

## 2023-06-28 DIAGNOSIS — J310 Chronic rhinitis: Secondary | ICD-10-CM

## 2023-06-28 NOTE — Telephone Encounter (Signed)
PER Pharmacy, Victoria Macdonald is a 77 year old female has requested a refill of fluticasone .      Last Office Visit: 02/21/2023 Alphia Kava, MD     Last Physical Exam: 09/27/2017    There are no preventive care reminders to display for this patient.    Other Med Adult:  Most Recent BP Reading(s)  05/18/23 : 151/79        Cholesterol (mg/dL)   Date Value   45/40/9811 235     LOW DENSITY LIPOPROTEIN DIRECT (mg/dL)   Date Value   91/47/8295 177     HIGH DENSITY LIPOPROTEIN (mg/dL)   Date Value   62/13/0865 39 (L)     TRIGLYCERIDES (mg/dL)   Date Value   78/46/9629 141         THYROID SCREEN TSH REFLEX FT4 (uIU/mL)   Date Value   12/14/2022 3.270         No results found for: "TSH"    HEMOGLOBIN A1C (%)   Date Value   08/03/2022 5.5       No results found for: "POCA1C"      INR (no units)   Date Value   04/27/2021 1.0   04/30/2018 1.0       SODIUM (mmol/L)   Date Value   08/03/2022 142       POTASSIUM (mmol/L)   Date Value   08/03/2022 4.5           CREATININE (mg/dL)   Date Value   52/84/1324 0.8     POC CREATININE (mg/dl)   Date Value   40/07/2724 0.9       Documented patient preferred pharmacies:    [id number] [address], [place] - [address]  Phone: [phone] Fax: [phone]

## 2023-07-09 NOTE — Progress Notes (Unsigned)
THORACIC SURGERY CLINIC FOLLOW UP     CC:     Oncologic history:     Status post VATS LLLy at [facility] in Jun 20, 2021 for a growing LLL 2 cm  nodule, final path pT1bN0M0R0, stage I    Interval history: Started gabapentin 300 mg HS x 1 week, then bid x 1 week, then tid.      HPI:     56F with a history of lung cancer as above. She still notes a lot of pain in her thorax since surgery.  We started gabapentin in August.     New Imaging:   none     A/P:     Pt is a 77 year old female with the above history of lung cancer.     CT chest and fu in Oct 2024        Duke Salvia, MD

## 2023-07-11 ENCOUNTER — Ambulatory Visit (HOSPITAL_BASED_OUTPATIENT_CLINIC_OR_DEPARTMENT_OTHER): Payer: No Typology Code available for payment source | Admitting: Thoracic Surgery (Cardiothoracic Vascular Surgery)

## 2023-08-07 ENCOUNTER — Ambulatory Visit
Admission: RE | Admit: 2023-08-07 | Discharge: 2023-08-07 | Disposition: A | Discharge status: Home / Self Care | Payer: No Typology Code available for payment source | Attending: Diagnostic Radiology | Admitting: Diagnostic Radiology

## 2023-08-07 ENCOUNTER — Other Ambulatory Visit: Payer: Self-pay

## 2023-08-07 DIAGNOSIS — R911 Solitary pulmonary nodule: Secondary | ICD-10-CM | POA: Insufficient documentation

## 2023-08-07 DIAGNOSIS — K769 Liver disease, unspecified: Secondary | ICD-10-CM | POA: Diagnosis present

## 2023-08-07 DIAGNOSIS — D7389 Other diseases of spleen: Secondary | ICD-10-CM | POA: Diagnosis present

## 2023-08-07 DIAGNOSIS — J432 Centrilobular emphysema: Secondary | ICD-10-CM | POA: Diagnosis present

## 2023-08-07 DIAGNOSIS — C3432 Malignant neoplasm of lower lobe, left bronchus or lung: Secondary | ICD-10-CM | POA: Insufficient documentation

## 2023-08-07 MED ORDER — IOHEXOL 350 MG/ML IV SOLN
55.0000 mL | Freq: Once | INTRAVENOUS | Status: AC
Start: 2023-08-07 — End: 2023-08-07
  Administered 2023-08-07: 55 mL via INTRAVENOUS

## 2023-08-07 MED ORDER — NORMAL SALINE FLUSH 0.9 % IV SOLN
50.0000 mL | Freq: Once | INTRAVENOUS | Status: AC
Start: 2023-08-07 — End: 2023-08-07
  Administered 2023-08-07: 50 mL via INTRAVENOUS

## 2023-08-08 ENCOUNTER — Ambulatory Visit: Payer: No Typology Code available for payment source | Admitting: Thoracic Surgery (Cardiothoracic Vascular Surgery)

## 2023-08-08 DIAGNOSIS — C3480 Malignant neoplasm of overlapping sites of unspecified bronchus and lung: Secondary | ICD-10-CM

## 2023-08-08 NOTE — Progress Notes (Signed)
THORACIC SURGERY CLINIC FOLLOW UP     CC:     Oncologic history:     Status post VATS LLLy at [facility] in Jun 20, 2021 for a growing LLL 2 cm  nodule, final path pT1bN0M0R0, stage I     HPI:     66F with a history of lung cancer as above. She still noted a lot of pain in her thorax since surgery when I met with her in August.  She tried neurontin but could not tolerate it.       New Imaging:     02/19/23 CT chest wo contrast: NED  07/25/2023 CT w contrast: NED     A/P:     Pt is a 77 year old female with the above history of lung cancer.     CT chest wo contrast in 1 year  TH visit after     Duke Salvia, MD  Thoracic Surgery

## 2023-08-14 ENCOUNTER — Telehealth (HOSPITAL_BASED_OUTPATIENT_CLINIC_OR_DEPARTMENT_OTHER): Payer: Self-pay

## 2023-08-14 NOTE — Telephone Encounter (Signed)
Left vm pt to sch televisit in 1 yr with Dr. Andrey Campanile after CT scan

## 2023-08-24 ENCOUNTER — Telehealth (HOSPITAL_BASED_OUTPATIENT_CLINIC_OR_DEPARTMENT_OTHER): Payer: Self-pay

## 2023-08-24 NOTE — Telephone Encounter (Signed)
Left pt vm to sch televisit with Dr. Andrey Campanile after CT scan to discuss results

## 2023-09-06 ENCOUNTER — Encounter (HOSPITAL_BASED_OUTPATIENT_CLINIC_OR_DEPARTMENT_OTHER): Payer: Self-pay

## 2023-09-06 ENCOUNTER — Ambulatory Visit
Admission: RE | Admit: 2023-09-06 | Discharge: 2023-09-06 | Disposition: A | Discharge status: Home / Self Care | Payer: No Typology Code available for payment source | Attending: Diagnostic Radiology | Admitting: Diagnostic Radiology

## 2023-09-06 ENCOUNTER — Other Ambulatory Visit: Payer: Self-pay

## 2023-09-06 DIAGNOSIS — B1910 Unspecified viral hepatitis B without hepatic coma: Secondary | ICD-10-CM | POA: Insufficient documentation

## 2023-09-06 DIAGNOSIS — K7689 Other specified diseases of liver: Secondary | ICD-10-CM | POA: Diagnosis present

## 2023-09-06 DIAGNOSIS — D1803 Hemangioma of intra-abdominal structures: Secondary | ICD-10-CM | POA: Insufficient documentation

## 2023-09-06 DIAGNOSIS — N281 Cyst of kidney, acquired: Secondary | ICD-10-CM | POA: Diagnosis present

## 2023-09-06 DIAGNOSIS — B181 Chronic viral hepatitis B without delta-agent: Secondary | ICD-10-CM

## 2023-09-06 DIAGNOSIS — K769 Liver disease, unspecified: Secondary | ICD-10-CM

## 2023-09-06 MED ORDER — NORMAL SALINE FLUSH 0.9 % IV SOLN
10.0000 mL | Freq: Once | INTRAVENOUS | Status: AC
Start: 2023-09-06 — End: 2023-09-06
  Administered 2023-09-06: 20 mL via INTRAVENOUS

## 2023-09-06 MED ORDER — GADOTERIDOL 279.3 MG/ML IV SOLN
1.0000 mL | Freq: Once | INTRAVENOUS | Status: AC
Start: 2023-09-06 — End: 2023-09-06
  Administered 2023-09-06: 13 mL via INTRAVENOUS

## 2023-09-07 NOTE — Progress Notes (Signed)
Results reviewed.  Will discuss at f/u visit- I will inform PCP of results and defer to PCP any follow up.  Cornelius Moras, MD

## 2023-09-27 ENCOUNTER — Other Ambulatory Visit (HOSPITAL_BASED_OUTPATIENT_CLINIC_OR_DEPARTMENT_OTHER): Payer: Self-pay

## 2023-09-27 DIAGNOSIS — J310 Chronic rhinitis: Secondary | ICD-10-CM

## 2023-09-27 NOTE — Telephone Encounter (Signed)
PER Pharmacy, Victoria Macdonald is a 77 year old female has requested a refill of flonase.      Last OFFICE Visit:  02/21/2023 Alphia Kava, MD     Last [place] See Visit: Recent Visits  No visits were found meeting these conditions.  Showing recent visits within past 365 days with a meds authorizing provider and meeting all other requirements  Future Appointments  No visits were found meeting these conditions.  Showing future appointments within next 0 days with a meds authorizing provider and meeting all other requirements      Last Physical Exam: 09/27/2017     There are no preventive care reminders to display for this patient.    Other Med Adult:  Most Recent BP Reading(s)  05/18/23 : 151/79        Cholesterol (mg/dL)   Date Value   16/07/9603 235     LOW DENSITY LIPOPROTEIN DIRECT (mg/dL)   Date Value   54/06/8118 177     HIGH DENSITY LIPOPROTEIN (mg/dL)   Date Value   14/78/2956 39 (L)     TRIGLYCERIDES (mg/dL)   Date Value   21/30/8657 141         THYROID SCREEN TSH REFLEX FT4 (uIU/mL)   Date Value   12/14/2022 3.270         No results found for: "TSH"    HEMOGLOBIN A1C (%)   Date Value   08/03/2022 5.5       No results found for: "POCA1C"      INR (no units)   Date Value   04/27/2021 1.0   04/30/2018 1.0       SODIUM (mmol/L)   Date Value   08/03/2022 142       POTASSIUM (mmol/L)   Date Value   08/03/2022 4.5           CREATININE (mg/dL)   Date Value   84/69/6295 0.8     POC CREATININE (mg/dl)   Date Value   28/41/3244 0.9       Documented patient preferred pharmacies:    [id number] [address], [place] - [address]  Phone: [phone] Fax: [phone]

## 2023-10-22 ENCOUNTER — Encounter (HOSPITAL_BASED_OUTPATIENT_CLINIC_OR_DEPARTMENT_OTHER): Payer: Self-pay

## 2023-10-22 DIAGNOSIS — K862 Cyst of pancreas: Secondary | ICD-10-CM | POA: Insufficient documentation

## 2023-10-29 ENCOUNTER — Encounter (HOSPITAL_BASED_OUTPATIENT_CLINIC_OR_DEPARTMENT_OTHER): Payer: Self-pay

## 2023-11-07 ENCOUNTER — Other Ambulatory Visit (HOSPITAL_BASED_OUTPATIENT_CLINIC_OR_DEPARTMENT_OTHER): Payer: Self-pay | Admitting: Gastroenterology

## 2023-11-07 DIAGNOSIS — K2270 Barrett's esophagus without dysplasia: Secondary | ICD-10-CM

## 2023-11-07 NOTE — Telephone Encounter (Signed)
PER Pharmacy, Victoria Macdonald is a 78 year old female has requested a refill of omeprazole 40.      Last OFFICE/TELE Visit:  n/a  Last Physical Exam:   09/27/2017     There are no preventive care reminders to display for this patient.    Other Med Adult:  Most Recent BP Reading(s)  05/18/23 : 151/79        Cholesterol (mg/dL)   Date Value   46/27/0350 235     LOW DENSITY LIPOPROTEIN DIRECT (mg/dL)   Date Value   09/38/1829 177     HIGH DENSITY LIPOPROTEIN (mg/dL)   Date Value   93/71/6967 39 (L)     TRIGLYCERIDES (mg/dL)   Date Value   89/38/1017 141         THYROID SCREEN TSH REFLEX FT4 (uIU/mL)   Date Value   12/14/2022 3.270         No results found for: "TSH"    HEMOGLOBIN A1C (%)   Date Value   08/03/2022 5.5       No results found for: "POCA1C"      INR (no units)   Date Value   04/27/2021 1.0   04/30/2018 1.0       SODIUM (mmol/L)   Date Value   08/03/2022 142       POTASSIUM (mmol/L)   Date Value   08/03/2022 4.5           CREATININE (mg/dL)   Date Value   51/11/5850 0.8     POC CREATININE (mg/dl)   Date Value   77/82/4235 0.9       Documented patient preferred pharmacies:    [id number] [address], [place] - [address]  Phone: [phone] Fax: [phone]

## 2023-11-23 ENCOUNTER — Ambulatory Visit (HOSPITAL_BASED_OUTPATIENT_CLINIC_OR_DEPARTMENT_OTHER): Payer: No Typology Code available for payment source | Admitting: Internal Medicine

## 2024-03-18 ENCOUNTER — Encounter (HOSPITAL_BASED_OUTPATIENT_CLINIC_OR_DEPARTMENT_OTHER): Payer: Self-pay

## 2024-03-18 MED ORDER — FEXOFENADINE HCL 180 MG PO TABS
180.0000 mg | ORAL_TABLET | Freq: Every day | ORAL | 3 refills | Status: AC
Start: 2024-03-18 — End: 2025-03-18

## 2024-03-18 NOTE — Telephone Encounter (Signed)
 PER Patient (self), Victoria Macdonald is a 78 year old female has requested a refill of fexofenadine .      Last Office Visit  : 02/21/2023 Thurl Floras, MD      Last Tele Visit:  08/08/2023      Last Physical Exam:  09/27/2017     There are no preventive care reminders to display for this patient.    Other Med Adult:  Most Recent BP Reading(s)  05/18/23 : 151/79        Cholesterol (mg/dL)   Date Value   84/13/2440 235     LOW DENSITY LIPOPROTEIN DIRECT (mg/dL)   Date Value   08/19/2535 177     HIGH DENSITY LIPOPROTEIN (mg/dL)   Date Value   64/40/3474 39 (L)     TRIGLYCERIDES (mg/dL)   Date Value   25/95/6387 141         THYROID  SCREEN TSH REFLEX FT4 (uIU/mL)   Date Value   12/14/2022 3.270         No results found for: "TSH"    HEMOGLOBIN A1C (%)   Date Value   08/03/2022 5.5       No results found for: "POCA1C"      INR (no units)   Date Value   04/27/2021 1.0   04/30/2018 1.0       SODIUM (mmol/L)   Date Value   08/03/2022 142       POTASSIUM (mmol/L)   Date Value   08/03/2022 4.5           CREATININE (mg/dL)   Date Value   56/43/3295 0.8     POC CREATININE (mg/dl)   Date Value   18/84/1660 0.9       Documented patient preferred pharmacies:    [id number] [address], [place] - [address]  Phone: [phone] Fax: [phone]

## 2024-04-05 ENCOUNTER — Other Ambulatory Visit (HOSPITAL_BASED_OUTPATIENT_CLINIC_OR_DEPARTMENT_OTHER): Payer: Self-pay

## 2024-04-05 DIAGNOSIS — J310 Chronic rhinitis: Secondary | ICD-10-CM

## 2024-04-05 NOTE — Telephone Encounter (Signed)
 PER Pharmacy, Victoria Macdonald is a 78 year old female has requested a refill of fluticasone  nasal spray.      Last Office Visit: [id number] with Nicki Barnacle  Last Physical Exam: [id number]      Other Med Adult:  Most Recent BP Reading(s)  05/18/23 : 151/79        Cholesterol (mg/dL)   Date Value   14/78/2956 235     LOW DENSITY LIPOPROTEIN DIRECT (mg/dL)   Date Value   21/30/8657 177     HIGH DENSITY LIPOPROTEIN (mg/dL)   Date Value   84/69/6295 39 (L)     TRIGLYCERIDES (mg/dL)   Date Value   28/41/3244 141         THYROID  SCREEN TSH REFLEX FT4 (uIU/mL)   Date Value   12/14/2022 3.270         No results found for: TSH    HEMOGLOBIN A1C (%)   Date Value   08/03/2022 5.5       No results found for: POCA1C      INR (no units)   Date Value   04/27/2021 1.0   04/30/2018 1.0       SODIUM (mmol/L)   Date Value   08/03/2022 142       POTASSIUM (mmol/L)   Date Value   08/03/2022 4.5           CREATININE (mg/dL)   Date Value   10/25/7251 0.8     POC CREATININE (mg/dl)   Date Value   66/44/0347 0.9       Documented patient preferred pharmacies:    [id number] [address], [place] - [address]  Phone: [phone] Fax: [phone]

## 2024-08-01 ENCOUNTER — Other Ambulatory Visit (HOSPITAL_BASED_OUTPATIENT_CLINIC_OR_DEPARTMENT_OTHER): Payer: No Typology Code available for payment source

## 2024-08-06 ENCOUNTER — Telehealth (HOSPITAL_BASED_OUTPATIENT_CLINIC_OR_DEPARTMENT_OTHER): Payer: Self-pay

## 2024-08-06 ENCOUNTER — Ambulatory Visit: Payer: No Typology Code available for payment source | Admitting: Thoracic Surgery (Cardiothoracic Vascular Surgery)

## 2024-08-06 NOTE — Telephone Encounter (Signed)
 Called the pt to reschedule tele visit appointment because they missed their CT scan. The patient didn't answer so a message was left to call back and reschedule

## 2024-10-04 ENCOUNTER — Other Ambulatory Visit (HOSPITAL_BASED_OUTPATIENT_CLINIC_OR_DEPARTMENT_OTHER): Payer: Self-pay

## 2024-10-04 DIAGNOSIS — J310 Chronic rhinitis: Secondary | ICD-10-CM

## 2024-10-04 NOTE — Telephone Encounter (Signed)
 PER who is calling: Pharmacy, Victoria Macdonald is a 78 year old female has requested a refill of       flonase .                Last Office Visit  : 02/21/2023 Maye Riddle, MD      Last Tele Visit:  08/08/2023      Last Physical Exam:  09/27/2017    There are no preventive care reminders to display for this patient.    Other Med Adult:  Most Recent BP Reading(s)  05/18/23 : 151/79        Cholesterol (mg/dL)   Date Value   97/77/7975 235     LOW DENSITY LIPOPROTEIN DIRECT (mg/dL)   Date Value   97/77/7975 177     HIGH DENSITY LIPOPROTEIN (mg/dL)   Date Value   97/77/7975 39 (L)     TRIGLYCERIDES (mg/dL)   Date Value   97/77/7975 141         THYROID  SCREEN TSH REFLEX FT4 (uIU/mL)   Date Value   12/14/2022 3.270         No results found for: TSH    HEMOGLOBIN A1C (%)   Date Value   08/03/2022 5.5       No results found for: POCA1C      INR (no units)   Date Value   04/27/2021 1.0   04/30/2018 1.0       SODIUM (mmol/L)   Date Value   08/03/2022 142       POTASSIUM (mmol/L)   Date Value   08/03/2022 4.5           CREATININE (mg/dL)   Date Value   89/87/7976 0.8     POC CREATININE (mg/dl)   Date Value   95/70/7975 0.9       Documented patient preferred pharmacies:    [id number] [place], [place] - [address]  Phone: [phone] Fax: [phone]
